# Patient Record
Sex: Female | Born: 1985 | Race: White | Hispanic: No | Marital: Single | Smoking: Current every day smoker
Health system: Southern US, Community
[De-identification: ages and names within clinical notes are randomized; demographics above are authoritative.]

## PROBLEM LIST (undated history)

## (undated) ENCOUNTER — Inpatient Hospital Stay (HOSPITAL_COMMUNITY): Payer: Self-pay

## (undated) DIAGNOSIS — F338 Other recurrent depressive disorders: Secondary | ICD-10-CM

## (undated) DIAGNOSIS — Z789 Other specified health status: Secondary | ICD-10-CM

## (undated) HISTORY — PX: NO PAST SURGERIES: SHX2092

## (undated) HISTORY — PX: WISDOM TOOTH EXTRACTION: SHX21

---
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
## 2006-03-01 ENCOUNTER — Inpatient Hospital Stay (HOSPITAL_COMMUNITY): Admission: AD | Admit: 2006-03-01 | Discharge: 2006-03-01 | Payer: Self-pay | Admitting: Obstetrics and Gynecology

## 2006-03-12 ENCOUNTER — Inpatient Hospital Stay (HOSPITAL_COMMUNITY): Admission: AD | Admit: 2006-03-12 | Discharge: 2006-03-12 | Payer: Self-pay | Admitting: Obstetrics & Gynecology

## 2006-03-15 ENCOUNTER — Inpatient Hospital Stay (HOSPITAL_COMMUNITY): Admission: AD | Admit: 2006-03-15 | Discharge: 2006-03-15 | Payer: Self-pay | Admitting: Obstetrics & Gynecology

## 2006-04-29 ENCOUNTER — Inpatient Hospital Stay (HOSPITAL_COMMUNITY): Admission: AD | Admit: 2006-04-29 | Discharge: 2006-04-30 | Payer: Self-pay | Admitting: Gynecology

## 2006-05-19 ENCOUNTER — Ambulatory Visit (HOSPITAL_COMMUNITY): Admission: RE | Admit: 2006-05-19 | Discharge: 2006-05-19 | Payer: Self-pay | Admitting: Obstetrics & Gynecology

## 2006-09-12 ENCOUNTER — Inpatient Hospital Stay (HOSPITAL_COMMUNITY): Admission: AD | Admit: 2006-09-12 | Discharge: 2006-09-12 | Payer: Self-pay | Admitting: Obstetrics and Gynecology

## 2006-09-12 ENCOUNTER — Ambulatory Visit: Payer: Self-pay | Admitting: Obstetrics and Gynecology

## 2006-09-14 ENCOUNTER — Inpatient Hospital Stay (HOSPITAL_COMMUNITY): Admission: AD | Admit: 2006-09-14 | Discharge: 2006-09-16 | Payer: Self-pay | Admitting: Obstetrics & Gynecology

## 2006-09-14 ENCOUNTER — Ambulatory Visit: Payer: Self-pay | Admitting: Obstetrics and Gynecology

## 2008-04-17 ENCOUNTER — Emergency Department (HOSPITAL_COMMUNITY): Admission: EM | Admit: 2008-04-17 | Discharge: 2008-04-17 | Payer: Self-pay | Admitting: Emergency Medicine

## 2008-09-19 ENCOUNTER — Emergency Department (HOSPITAL_COMMUNITY): Admission: EM | Admit: 2008-09-19 | Discharge: 2008-09-19 | Payer: Self-pay | Admitting: Emergency Medicine

## 2008-09-20 ENCOUNTER — Emergency Department (HOSPITAL_COMMUNITY): Admission: EM | Admit: 2008-09-20 | Discharge: 2008-09-20 | Payer: Self-pay | Admitting: Emergency Medicine

## 2011-07-03 LAB — RAPID STREP SCREEN (MED CTR MEBANE ONLY): Streptococcus, Group A Screen (Direct): NEGATIVE

## 2011-09-10 ENCOUNTER — Ambulatory Visit (INDEPENDENT_AMBULATORY_CARE_PROVIDER_SITE_OTHER): Payer: BC Managed Care – PPO

## 2011-09-10 DIAGNOSIS — J209 Acute bronchitis, unspecified: Secondary | ICD-10-CM

## 2011-09-10 DIAGNOSIS — Z309 Encounter for contraceptive management, unspecified: Secondary | ICD-10-CM

## 2011-09-10 DIAGNOSIS — R05 Cough: Secondary | ICD-10-CM

## 2011-09-10 DIAGNOSIS — R059 Cough, unspecified: Secondary | ICD-10-CM

## 2011-09-29 NOTE — L&D Delivery Note (Signed)
Delivery Note At 9:19 PM a viable and healthy female was delivered via  (Presentation: vertex; LOA ).  APGAR: 8,9 ; weight pending. Placenta status: spontaneous, intact.  Cord:normal, 3 vessel. Cord pH: n/a  Anesthesia:  Epidural Episiotomy:  Lacerations: none Suture Repair: n/a Est. Blood Loss (mL):   Mom to postpartum.  Baby to nursery-stable.  Napoleon Form 06/09/2012, 9:36 PM

## 2011-10-10 ENCOUNTER — Ambulatory Visit (INDEPENDENT_AMBULATORY_CARE_PROVIDER_SITE_OTHER): Payer: BC Managed Care – PPO

## 2011-10-10 DIAGNOSIS — N390 Urinary tract infection, site not specified: Secondary | ICD-10-CM

## 2011-10-10 DIAGNOSIS — N760 Acute vaginitis: Secondary | ICD-10-CM

## 2011-10-10 DIAGNOSIS — R109 Unspecified abdominal pain: Secondary | ICD-10-CM

## 2011-11-15 ENCOUNTER — Encounter (HOSPITAL_COMMUNITY): Payer: Self-pay | Admitting: *Deleted

## 2011-11-15 ENCOUNTER — Inpatient Hospital Stay (HOSPITAL_COMMUNITY)
Admission: AD | Admit: 2011-11-15 | Discharge: 2011-11-15 | Disposition: A | Discharge status: Home / Self Care | Payer: BC Managed Care – PPO | Source: Ambulatory Visit | Attending: Obstetrics & Gynecology | Admitting: Obstetrics & Gynecology

## 2011-11-15 ENCOUNTER — Ambulatory Visit (INDEPENDENT_AMBULATORY_CARE_PROVIDER_SITE_OTHER): Payer: BC Managed Care – PPO | Admitting: Family Medicine

## 2011-11-15 ENCOUNTER — Inpatient Hospital Stay (HOSPITAL_COMMUNITY): Payer: BC Managed Care – PPO

## 2011-11-15 DIAGNOSIS — N949 Unspecified condition associated with female genital organs and menstrual cycle: Secondary | ICD-10-CM

## 2011-11-15 DIAGNOSIS — Z1389 Encounter for screening for other disorder: Secondary | ICD-10-CM

## 2011-11-15 DIAGNOSIS — R1032 Left lower quadrant pain: Secondary | ICD-10-CM

## 2011-11-15 DIAGNOSIS — Z3490 Encounter for supervision of normal pregnancy, unspecified, unspecified trimester: Secondary | ICD-10-CM

## 2011-11-15 DIAGNOSIS — J45909 Unspecified asthma, uncomplicated: Secondary | ICD-10-CM | POA: Insufficient documentation

## 2011-11-15 DIAGNOSIS — Z3201 Encounter for pregnancy test, result positive: Secondary | ICD-10-CM

## 2011-11-15 DIAGNOSIS — R110 Nausea: Secondary | ICD-10-CM

## 2011-11-15 DIAGNOSIS — O3680X0 Pregnancy with inconclusive fetal viability, not applicable or unspecified: Secondary | ICD-10-CM

## 2011-11-15 DIAGNOSIS — R109 Unspecified abdominal pain: Secondary | ICD-10-CM | POA: Insufficient documentation

## 2011-11-15 DIAGNOSIS — O99891 Other specified diseases and conditions complicating pregnancy: Secondary | ICD-10-CM | POA: Insufficient documentation

## 2011-11-15 HISTORY — DX: Other recurrent depressive disorders: F33.8

## 2011-11-15 LAB — POCT CBC
HCT, POC: 41.4 % (ref 37.7–47.9)
Hemoglobin: 14 g/dL (ref 12.2–16.2)
Lymph, poc: 2.5 (ref 0.6–3.4)
MCH, POC: 30.1 pg (ref 27–31.2)
MCHC: 33.8 g/dL (ref 31.8–35.4)
MCV: 89 fL (ref 80–97)
POC MID %: 6.4 %M (ref 0–12)
WBC: 12.9 10*3/uL — AB (ref 4.6–10.2)

## 2011-11-15 LAB — POCT WET PREP WITH KOH
KOH Prep POC: NEGATIVE
Yeast Wet Prep HPF POC: NEGATIVE

## 2011-11-15 NOTE — Progress Notes (Signed)
Subjective:    Patient ID: Laura Guerra, female    DOB: 12/26/1985, 26 y.o.   MRN: [id number]  HPI IUD removal 09/10/2011-no period since then. +pregnancy test 5 days ago. C/O 2 weeks of breast tenderness and nausea. 4 days ago had 1 minute of severe LLQ pain, then it occurred again this morning and lasted about 1 minute.  (Occurred X 1 during visit and brought her to tears.)  No fever.  No vomiting.  G2 P1 (normal pregnancy and vaginal delivery)   Review of Systems  Genitourinary: Positive for flank pain, vaginal discharge and pelvic pain. Negative for dysuria, urgency and vaginal bleeding.  All other systems reviewed and are negative.        Objective:   Physical Exam  Nursing note and vitals reviewed. Constitutional: She is oriented to person, place, and time. She appears well-developed and well-nourished.       Tearful during a sharp pain, otherwise ok  HENT:  Head: Normocephalic and atraumatic.  Neck: Normal range of motion. Neck supple. No thyromegaly present.  Cardiovascular: Normal rate, regular rhythm, normal heart sounds and intact distal pulses.  Exam reveals no gallop and no friction rub.   No murmur heard. Pulmonary/Chest: Effort normal and breath sounds normal. No respiratory distress. She has no wheezes.  Abdominal: Soft. Bowel sounds are normal. She exhibits no distension and no mass. There is no tenderness. There is no rebound and no guarding.  Genitourinary: Vagina normal. Pelvic exam was performed with patient supine. Uterus is enlarged (6-8week uterus). Uterus is not tender. Cervix exhibits discharge. Cervix exhibits no motion tenderness and no friability. Right adnexum displays no fullness (6-8 week fullness). Left adnexum displays tenderness and fullness (mild). No bleeding around the vagina.    Lymphadenopathy:    She has no cervical adenopathy.  Neurological: She is alert and oriented to person, place, and time. She has normal reflexes. No cranial nerve  deficit.  Skin: Skin is warm and dry.  Psychiatric: She has a normal mood and affect.   Results for orders placed in visit on 11/15/11  POCT CBC      Component Value Range   WBC 12.9 (*) 4.6 - 10.2 (K/uL)   Lymph, poc 2.5  0.6 - 3.4    POC LYMPH PERCENT 19.4  10 - 50 (%L)   MID (cbc) 0.8  0 - 0.9    POC MID % 6.4  0 - 12 (%M)   POC Granulocyte 9.6 (*) 2 - 6.9    Granulocyte percent 74.2  37 - 80 (%G)   RBC 4.65  4.04 - 5.48 (M/uL)   Hemoglobin 14.0  12.2 - 16.2 (g/dL)   HCT, POC 11.9  14.7 - 47.9 (%)   MCV 89.0  80 - 97 (fL)   MCH, POC 30.1  27 - 31.2 (pg)   MCHC 33.8  31.8 - 35.4 (g/dL)   RDW, POC 82.9     Platelet Count, POC 244  142 - 424 (K/uL)   MPV 8.7  0 - 99.8 (fL)  POCT WET PREP WITH KOH      Component Value Range   Trichomonas, UA Negative     Clue Cells Wet Prep HPF POC 100%     Epithelial Wet Prep HPF POC Mod     Yeast Wet Prep HPF POC neg     Bacteria Wet Prep HPF POC 4+     RBC Wet Prep HPF POC 0-2     WBC Wet Prep  HPF POC 10-15     KOH Prep POC Negative             Assessment & Plan:  Spoke with Maylon Cos Nurse  Midwife on call, sending directly to Maternity Admissions for R/O ectopic pregnancy.  They will type and cross and order quantitative hcg there. Gen probe sent to [employer] [facility].

## 2011-11-15 NOTE — Progress Notes (Signed)
Pt reports having LLQ abd pain on and off since Thursday. Went to urgent care and was sent to MAU for further eval.

## 2011-11-15 NOTE — Discharge Instructions (Signed)
    ________________________________________ ° ° ° ° °To schedule your Maternity Eligibility Appointment, please call [phone].  When you arrive for your appointment you must bring the following items or information listed below.  Your appointment will be rescheduled if you do not have these items or are 15 minutes late. °If currently receiving Medicaid, you MUST bring: °1. Medicaid Card °2. Social Security Card °3. Picture ID °4. Proof of Pregnancy °5. Verification of current address if the address on Medicaid card is incorrect "postmarked mail" °If not receiving Medicaid, you MUST bring: °1. Social Security Card °2. Picture ID °3. Birth Certificate (if available) Passport or *Green Card °4. Proof of Pregnancy °5. Verification of current address "postmarked mail" for each income presented. °6. Verification of insurance coverage, if any °7. Check stubs from each employer for the previous month (if unable to present check stub  °for each week, we will accept check stub for the first and last week ill the same month.) If you can't locate check stubs, you must bring a letter from the employer(s) and it must have the following information on letterhead, typed, in English: °o name of company °o company telephone number °o how long been with the company, if less than one month °o how much person earns per hour °o how many hours per week work °o the gross pay the person earned for the previous month °If you are 26 years old or less, you do not have to bring proof of income unless you work or live with the father of the baby and at that time we will need proof of income from you and/or the father of the baby. °Green Card recipients are eligible for Medicaid for Pregnant Women (MPW) ° °Prenatal Care Providers °[facility]    [facility] ° Phone- [phone]     Phone: [phone] °         °[facility]                      [facility] ° @[place]     Phone: [phone] ° Phone: [phone] °        [facility] °[facility]     Phone: [phone] ° Phone: [phone]   °        [facility] °[facility] [place]                hone: [phone] ° Phone: [phone] °        [facility] °Dr. Bernard Marshall      Phone: [phone] ° Phone: [phone] °        [facility] °[facility]                Phone: [phone] ° [facility]  ° Phone:[phone]    [facility]) °         Phone: [phone] °[facility] °  Phone: [phone] °

## 2011-12-07 ENCOUNTER — Inpatient Hospital Stay (HOSPITAL_COMMUNITY)
Admission: AD | Admit: 2011-12-07 | Discharge: 2011-12-07 | Disposition: A | Discharge status: Home / Self Care | Payer: BC Managed Care – PPO | Source: Ambulatory Visit | Attending: Obstetrics and Gynecology | Admitting: Obstetrics and Gynecology

## 2011-12-07 ENCOUNTER — Encounter (HOSPITAL_COMMUNITY): Payer: Self-pay | Admitting: *Deleted

## 2011-12-07 ENCOUNTER — Inpatient Hospital Stay (HOSPITAL_COMMUNITY): Payer: BC Managed Care – PPO

## 2011-12-07 DIAGNOSIS — O200 Threatened abortion: Secondary | ICD-10-CM | POA: Insufficient documentation

## 2011-12-07 LAB — URINALYSIS, ROUTINE W REFLEX MICROSCOPIC
Glucose, UA: NEGATIVE mg/dL
Leukocytes, UA: NEGATIVE
Specific Gravity, Urine: 1.025 (ref 1.005–1.030)

## 2011-12-07 LAB — URINE MICROSCOPIC-ADD ON

## 2011-12-07 NOTE — MAU Note (Signed)
Pt states she went to lthe bathroom and noticed a bloody discharge when she urinated around 1930-not wearing a pad now

## 2011-12-07 NOTE — ED Provider Notes (Signed)
History    26 yo G2P1001 WF @ 12 2/[redacted] wk gestation presents w/ c/o vaginal bleeding. (+) coitus 2 days ago. Some cramping  Chief Complaint  Patient presents with  . Vaginal Bleeding     OB History    Grav Para Term Preterm Abortions TAB SAB Ect Mult Living   2 1 1       1       Past Medical History  Diagnosis Date  . Seasonal depression     Past Surgical History  Procedure Date  . No past surgeries     Family History  Problem Relation Age of Onset  . Cancer Father   . Cancer Maternal Grandmother     History  Substance Use Topics  . Smoking status: Former Smoker    Types: Cigarettes    Quit date: 11/10/2011  . Smokeless tobacco: Not on file  . Alcohol Use: Not on file    Allergies: No Known Allergies  Prescriptions prior to admission  Medication Sig Dispense Refill  . ibuprofen (ADVIL,MOTRIN) 200 MG tablet Take 400 mg by mouth every 6 (six) hours as needed. For pain.         Physical Exam   Blood pressure 107/63, pulse 83, temperature 98.2 F (36.8 C), temperature source Oral, resp. rate 20, height 5\' 4"  (1.626 m), weight 155 lb (70.308 kg), last menstrual period 09/12/2011, SpO2 99.00%.  General appearance: alert, cooperative and no distress Lungs: clear to auscultation bilaterally Heart: regular rate and rhythm, S1, S2 normal, no murmur, click, rub or gallop Abdomen: soft, non-tender; bowel sounds normal; no masses,  no organomegaly Pelvic: cervix normal in appearance, external genitalia normal, no adnexal masses or tenderness and dark red blood in vagina cervix parous closed uterus 9-10 wk size Extremities: no edema, redness or tenderness in the calves or thighs ED Course  Threatened AB P) check maternal blood type( =O pos), pelvic sono . Threatened ab prec MDM  Serita Kyle, MD 9:58 PM 12/07/2011

## 2011-12-31 ENCOUNTER — Ambulatory Visit (INDEPENDENT_AMBULATORY_CARE_PROVIDER_SITE_OTHER): Payer: BC Managed Care – PPO | Admitting: Family Medicine

## 2011-12-31 VITALS — BP 98/60 | HR 85 | Temp 98.3°F | Resp 16 | Ht 64.25 in | Wt 157.8 lb

## 2011-12-31 DIAGNOSIS — R51 Headache: Secondary | ICD-10-CM

## 2011-12-31 MED ORDER — BUTALBITAL-APAP-CAFFEINE 50-325-40 MG PO TABS: 1.0000 | ORAL_TABLET | Freq: Four times a day (QID) | ORAL | Status: DC | PRN

## 2011-12-31 NOTE — Progress Notes (Signed)
  Subjective:    Patient ID: Laura Guerra, female    DOB: Sep 30, 1985, 26 y.o.   MRN: [id number]  HPI 26 yo female, 15.[redacted] weeks pregnant, with headache.  [facility].  Next appt Monday.   Monday at work got severe headache.  Frontal.  Pressure.  Comes and goes.  Getting daily. Occasionally pounding.  Headaches worse since pregnancy.  Tylenol not helping.  No runny nose, no cough.   No headache today.   Review of Systems Negative except as per HPI     Objective:   Physical Exam  Constitutional: Vital signs are normal. She appears well-developed and well-nourished. She is active and cooperative.  HENT:  Head: Normocephalic.  Right Ear: Hearing, tympanic membrane, external ear and ear canal normal.  Left Ear: Hearing, tympanic membrane, external ear and ear canal normal.  Nose: Nose normal.  Mouth/Throat: Uvula is midline, oropharynx is clear and moist and mucous membranes are normal.  Eyes: Conjunctivae, EOM and lids are normal. Pupils are equal, round, and reactive to light. Right eye exhibits no nystagmus. Left eye exhibits no nystagmus.  Cardiovascular: Normal rate, regular rhythm, normal heart sounds, intact distal pulses and normal pulses.   Pulmonary/Chest: Effort normal and breath sounds normal.  Neurological: She is alert. She has normal strength. No cranial nerve deficit. She displays a negative Romberg sign. Coordination and gait normal.          Assessment & Plan:  Headaches, worsened in pregnancy.  Can try fioricet for severest headaches, otherwise use tylenol.  Advised to bring up with OB at appt Monday as well.

## 2012-01-23 ENCOUNTER — Inpatient Hospital Stay (HOSPITAL_COMMUNITY)
Admission: AD | Admit: 2012-01-23 | Discharge: 2012-01-23 | Disposition: A | Discharge status: Home / Self Care | Payer: BC Managed Care – PPO | Source: Ambulatory Visit | Attending: Obstetrics & Gynecology | Admitting: Obstetrics & Gynecology

## 2012-01-23 ENCOUNTER — Encounter (HOSPITAL_COMMUNITY): Payer: Self-pay

## 2012-01-23 ENCOUNTER — Inpatient Hospital Stay (HOSPITAL_COMMUNITY): Payer: BC Managed Care – PPO

## 2012-01-23 DIAGNOSIS — N949 Unspecified condition associated with female genital organs and menstrual cycle: Secondary | ICD-10-CM

## 2012-01-23 DIAGNOSIS — O9A219 Injury, poisoning and certain other consequences of external causes complicating pregnancy, unspecified trimester: Secondary | ICD-10-CM

## 2012-01-23 DIAGNOSIS — Z87440 Personal history of urinary (tract) infections: Secondary | ICD-10-CM

## 2012-01-23 DIAGNOSIS — R1032 Left lower quadrant pain: Secondary | ICD-10-CM | POA: Insufficient documentation

## 2012-01-23 DIAGNOSIS — Z3480 Encounter for supervision of other normal pregnancy, unspecified trimester: Secondary | ICD-10-CM

## 2012-01-23 DIAGNOSIS — O99891 Other specified diseases and conditions complicating pregnancy: Secondary | ICD-10-CM | POA: Insufficient documentation

## 2012-01-23 HISTORY — DX: Other specified health status: Z78.9

## 2012-01-23 LAB — URINALYSIS, ROUTINE W REFLEX MICROSCOPIC
Ketones, ur: NEGATIVE mg/dL
Leukocytes, UA: NEGATIVE
Protein, ur: NEGATIVE mg/dL
Urobilinogen, UA: 0.2 mg/dL (ref 0.0–1.0)

## 2012-01-23 LAB — URINE MICROSCOPIC-ADD ON

## 2012-01-23 LAB — WET PREP, GENITAL
Trich, Wet Prep: NONE SEEN
Yeast Wet Prep HPF POC: NONE SEEN

## 2012-01-23 MED ORDER — IBUPROFEN 600 MG PO TABS: 600.0000 mg | ORAL_TABLET | Freq: Four times a day (QID) | ORAL | Status: AC | PRN

## 2012-01-23 MED ORDER — IBUPROFEN 600 MG PO TABS
600.0000 mg | ORAL_TABLET | Freq: Once | ORAL | Status: AC
  Administered 2012-01-23: 600 mg via ORAL
  Filled 2012-01-23: qty 1

## 2012-01-23 NOTE — Discharge Instructions (Signed)
Prenatal Care Providers °[facility]    [facility] ° Phone- [phone]     Phone: [phone] °         °[facility]                      [facility] ° @[place]     Phone: [phone] ° Phone: [phone] °        [facility] °[facility]     Phone: [phone] ° Phone: [phone]   °        [facility] °[facility] [place]                hone: [phone] ° Phone: [phone] °        [facility] °Dr. Bernard Marshall      Phone: [phone] ° Phone: [phone] °        [facility] °[facility]                Phone: [phone] ° [facility]  ° Phone:[phone]    [facility] ([place]) °         Phone: [phone] °[facility] °  Phone: [phone] °
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
## 2012-01-24 LAB — URINE CULTURE
Colony Count: NO GROWTH
Culture: NO GROWTH

## 2012-01-25 LAB — GC/CHLAMYDIA PROBE AMP, GENITAL: Chlamydia, DNA Probe: NEGATIVE

## 2012-01-28 ENCOUNTER — Telehealth: Payer: Self-pay | Admitting: *Deleted

## 2012-01-28 DIAGNOSIS — O4590 Premature separation of placenta, unspecified, unspecified trimester: Secondary | ICD-10-CM | POA: Insufficient documentation

## 2012-01-28 NOTE — Telephone Encounter (Signed)
Message copied by Jill Side on Thu Jan 28, 2012 11:27 AM ------      Message from: Malena Catholic      Created: Thu Jan 28, 2012 10:54 AM       I made her [facility] appt for 5/9 at 8:00 am.            Thanks,      Herbert Seta      ----- Message -----         From: Drucilla Schmidt Linc Renne, RN         Sent: 01/28/2012  10:02 AM           To: [facility] Admin Pool            Please schedule HR clinic appt, then I will schedule [place] and call pt.  Thanks       ----- Message -----         From: Lesly Dukes, MD         Sent: 01/25/2012   6:39 PM           To: [facility] Clinical Pool            Pt has large placenta abruption from abdominal trauma.  Pt needs to be seen in HR clinic, and f/u scan in 2 weeks from last scan at [facility] for large abruption.

## 2012-01-28 NOTE — Telephone Encounter (Signed)
Called and spoke w/pt re: plan of care and appts which have been made.  Pt stated that she had made appt @ [facility] for prenatal care next week and did not know that we had an [facility] office here [facility].  She further stated that she liked [facility] very much and would prefer to be seen by a practice that she is a part of.  I informed pt that she needs to be aware that she would see both midwives and physicians @ our clinic and it would not be the same person all the time. Pt was agreeable and will keep appts as scheduled @ our clinic and [place] @ [facility] on 02/08/12.  She will cancel the appt @ [facility].  When I mentioned the concern for pt having placental abruption, she seemed not to be aware of the diagnosis. I explained the situation to her satisfaction. She had questions about working because she lifts heavy items and is still having abdominal pain. I stated that I will check with the doctor as to whether or not it is allright for her to continue working.  I will call her back when I have an answer. Pt voiced understanding.

## 2012-01-28 NOTE — Telephone Encounter (Signed)
Called pt @ 1640 and informed her that the doctor said it is ok for her to work @ this point in time.  Pt became upset and expressed concerns because of the physical nature of her job requirements and the fact that she has a HR pregnancy. I advised pt to discuss her concerns completely @ her visit on 02/04/12. I stated that it may help for the midwife to hear directly from her what her job entails.  Pt asked again for information about placental abruption.  I explained the condition and stated that many people are able to work under these circumstances because the severity is not the same for everyone.  Pt stated that she is expected to lift up to 70 lbs by herself @ work. I stated that we can give her a note which states that she can only lift up to 20 lbs.  Pt voiced understanding.

## 2012-02-04 ENCOUNTER — Encounter: Payer: Self-pay | Admitting: Obstetrics & Gynecology

## 2012-02-04 ENCOUNTER — Encounter: Payer: Self-pay | Admitting: Obstetrics and Gynecology

## 2012-02-04 ENCOUNTER — Ambulatory Visit (INDEPENDENT_AMBULATORY_CARE_PROVIDER_SITE_OTHER): Payer: BC Managed Care – PPO | Admitting: Obstetrics and Gynecology

## 2012-02-04 VITALS — BP 114/63 | Temp 97.1°F | Wt 161.7 lb

## 2012-02-04 DIAGNOSIS — O99330 Smoking (tobacco) complicating pregnancy, unspecified trimester: Secondary | ICD-10-CM

## 2012-02-04 DIAGNOSIS — O4590 Premature separation of placenta, unspecified, unspecified trimester: Secondary | ICD-10-CM

## 2012-02-04 NOTE — Progress Notes (Signed)
Nutrition Note: (1st visit consult) Pt seen at clinic for bleeding in 2nd trimester, pt was also a smoker but quit. Pt reports hx of high wt gain of 72# with daughter.  Pt has good intake with a wide variety of foods.  No N/V and no food allergies.  Pt has hx of daily physical activity, bike riding, and wt lifting. Cannot currently lift >10# or be on feet for >20 minutes due to bleeding.  Disc alternatives for activity.   Disc wt gain goals of 25-35#.  Referred to [facility] for appt if needed.  Follow up if referred.  Cy Blamer, RD

## 2012-02-04 NOTE — MAU Provider Note (Signed)
Medical Screening exam and patient care preformed by advanced practice provider.  Agree with the above management.  

## 2012-02-04 NOTE — Patient Instructions (Signed)
Pregnancy and Smoking Smoking during pregnancy is very unhealthy for the mother and the developing fetus. The addictive drug in cigarettes (nicotine), carbon monoxide, and many other poisons are inhaled from a cigarette and are carried through your bloodstream to your fetus. Cigarette smoke contains more than 2,500 chemicals. It is not known which of these chemicals are harmful to the developing fetus. However, both nicotine and carbon monoxide play a role in causing health problems in pregnancy. Effects on the fetus of smoking during pregnancy:  Decrease in blood flow and oxygen to the uterus, placenta, and your fetus.   Increased heart rate of the fetus.   Slowing of your fetus's growth in the uterus (intrauterine growth retardation).   Placental problems. Placenta may partially cover or completely cover the opening to the cervix (placenta previa), or the placenta may partially or completely separate from the uterus (placental abruption).   Increase risk of pregnancy outside of the uterus (tubal pregnancy).   Premature rupture membranes, causing the sac that holds the fetus to break too early, resulting in premature birth and increased health risks to the newborn.   Increased risk of birth defects, including heart defects.   Increased risk of miscarriage.  Newborns born to women who smoke during pregnancy:  Are more likely to be born too early (prematurely).   Are more likely to be at a low birth weight.   Are at risk for serious health problems, chronic or lifelong disabilities (cerebral palsy, mental retardation, learning problems), and possibly even death   Are at risk of Sudden Infant Death Syndrome (SIDS).   Have higher rates of miscarriage and stillbirth.   Have more lung and breathing (respiratory) problems.  Long-term effects on a child's behavior: Some of the following trends are seen with children of smoking mothers:  Increased risk for drug abuse, behavior, and  conduct disorders.   Increased risk for smoking in adolescent girls.   Increased risk for negative behavior in 2-year-olds.   Increase risk for asthma, colic, and childhood obesity, which can lead to diabetes.   Increased risk for finger and toe disorders.  Resources to stop smoking during pregnancy:  Counseling.   Psychological treatment.   Acupuncture.   Family intervention.   Hypnosis.   Medicines that are safe to take during pregnancy. Nicotine supplements have not been studied enough. They should only be considered when all other methods fail.   Telephone QUIT lines.  Smoking and Breastfeeding:  Nicotine gets passed to the infant through a mother's breastmilk. This can cause nausea, colic, cramping, and diarrhea in the infant.   Smoking may reduce milk supply and interfere with the let-down response.   Even formula-fed infants of mothers who smoke have nicotine and cotinine (nicotine by-product) in their urine.  Other resources to help stop smoking:  American Cancer Society: www.cancer.org   American Heart Association: www.americanheart.org   National Cancer Institute: www.cancer.gov   Smoke Free Families: www.smokefreefamilies.org   [employer] (QUIT Line): 1-866-66 START  Document Released: 01/26/2005 Document Revised: 09/03/2011 Document Reviewed: 06/26/2009 [employer] Patient Information 2012 [employer]. Breastfeeding BENEFITS OF BREASTFEEDING For the baby  The first milk (colostrum) helps the baby's digestive system function better.   There are antibodies from the mother in the milk that help the baby fight off infections.   The baby has a lower incidence of asthma, allergies, and SIDS (sudden infant death syndrome).   The nutrients in breast milk are better than formulas for the baby and helps the baby's   brain grow better.   Babies who breastfeed have less gas, colic, and constipation.  For the mother  Breastfeeding helps develop a very special  bond between mother and baby.   It is more convenient, always available at the correct temperature and cheaper than formula feeding.   It burns calories in the mother and helps with losing weight that was gained during pregnancy.   It makes the uterus contract back down to normal size faster and slows bleeding following delivery.   Breastfeeding mothers have a lower risk of developing breast cancer.  NURSE FREQUENTLY  A healthy, full-term baby may breastfeed as often as every hour or space his or her feedings to every 3 hours.   How often to nurse will vary from baby to baby. Watch your baby for signs of hunger, not the clock.   Nurse as often as the baby requests, or when you feel the need to reduce the fullness of your breasts.   Awaken the baby if it has been 3 to 4 hours since the last feeding.   Frequent feeding will help the mother make more milk and will prevent problems like sore nipples and engorgement of the breasts.  BABY'S POSITION AT THE BREAST  Whether lying down or sitting, be sure that the baby's tummy is facing your tummy.   Support the breast with 4 fingers underneath the breast and the thumb above. Make sure your fingers are well away from the nipple and baby's mouth.   Stroke the baby's lips and cheek closest to the breast gently with your finger or nipple.   When the baby's mouth is open wide enough, place all of your nipple and as much of the dark area around the nipple as possible into your baby's mouth.   Pull the baby in close so the tip of the nose and the baby's cheeks touch the breast during the feeding.  FEEDINGS  The length of each feeding varies from baby to baby and from feeding to feeding.   The baby must suck about 2 to 3 minutes for your milk to get to him or her. This is called a "let down." For this reason, allow the baby to feed on each breast as long as he or she wants. Your baby will end the feeding when he or she has received the right  balance of nutrients.   To break the suction, put your finger into the corner of the baby's mouth and slide it between his or her gums before removing your breast from his or her mouth. This will help prevent sore nipples.  REDUCING BREAST ENGORGEMENT  In the first week after your baby is born, you may experience signs of breast engorgement. When breasts are engorged, they feel heavy, warm, full, and may be tender to the touch. You can reduce engorgement if you:   Nurse frequently, every 2 to 3 hours. Mothers who breastfeed early and often have fewer problems with engorgement.   Place light ice packs on your breasts between feedings. This reduces swelling. Wrap the ice packs in a lightweight towel to protect your skin.   Apply moist hot packs to your breast for 5 to 10 minutes before each feeding. This increases circulation and helps the milk flow.   Gently massage your breast before and during the feeding.   Make sure that the baby empties at least one breast at every feeding before switching sides.   Use a breast pump to empty the breasts if   your baby is sleepy or not nursing well. You may also want to pump if you are returning to work or or you feel you are getting engorged.   Avoid bottle feeds, pacifiers or supplemental feedings of water or juice in place of breastfeeding.   Be sure the baby is latched on and positioned properly while breastfeeding.   Prevent fatigue, stress, and anemia.   Wear a supportive bra, avoiding underwire styles.   Eat a balanced diet with enough fluids.  If you follow these suggestions, your engorgement should improve in 24 to 48 hours. If you are still experiencing difficulty, call your lactation consultant or caregiver. IS MY BABY GETTING ENOUGH MILK? Sometimes, mothers worry about whether their babies are getting enough milk. You can be assured that your baby is getting enough milk if:  The baby is actively sucking and you hear swallowing.   The  baby nurses at least 8 to 12 times in a 24 hour time period. Nurse your baby until he or she unlatches or falls asleep at the first breast (at least 10 to 20 minutes), then offer the second side.   The baby is wetting 5 to 6 disposable diapers (6 to 8 cloth diapers) in a 24 hour period by 5 to 6 days of age.   The baby is having at least 2 to 3 stools every 24 hours for the first few months. Breast milk is all the food your baby needs. It is not necessary for your baby to have water or formula. In fact, to help your breasts make more milk, it is best not to give your baby supplemental feedings during the early weeks.   The stool should be soft and yellow.   The baby should gain 4 to 7 ounces per week after he is 4 days old.  TAKE CARE OF YOURSELF Take care of your breasts by:  Bathing or showering daily.   Avoiding the use of soaps on your nipples.   Start feedings on your left breast at one feeding and on your right breast at the next feeding.   You will notice an increase in your milk supply 2 to 5 days after delivery. You may feel some discomfort from engorgement, which makes your breasts very firm and often tender. Engorgement "peaks" out within 24 to 48 hours. In the meantime, apply warm moist towels to your breasts for 5 to 10 minutes before feeding. Gentle massage and expression of some milk before feeding will soften your breasts, making it easier for your baby to latch on. Wear a well fitting nursing bra and air dry your nipples for 10 to 15 minutes after each feeding.   Only use cotton bra pads.   Only use pure lanolin on your nipples after nursing. You do not need to wash it off before nursing.  Take care of yourself by:   Eating well-balanced meals and nutritious snacks.   Drinking milk, fruit juice, and water to satisfy your thirst (about 8 glasses a day).   Getting plenty of rest.   Increasing calcium in your diet (1200 mg a day).   Avoiding foods that you notice affect  the baby in a bad way.  SEEK MEDICAL CARE IF:   You have any questions or difficulty with breastfeeding.   You need help.   You have a hard, red, sore area on your breast, accompanied by a fever of 100.5 F (38.1 C) or more.   Your baby is too sleepy to   eat well or is having trouble sleeping.   Your baby is wetting less than 6 diapers per day, by 5 days of age.   Your baby's skin or white part of his or her eyes is more yellow than it was in the hospital.   You feel depressed.  Document Released: 09/14/2005 Document Revised: 09/03/2011 Document Reviewed: 04/29/2009 [employer] Patient Information 2012 [employer]. 

## 2012-02-04 NOTE — Progress Notes (Signed)
   Subjective:    Laura Guerra is a G2P1001 [redacted]w[redacted]d being seen today for her first obstetrical visit.  Her obstetrical history is significant for smoker and 2nd tri vag bleed. Had rug roll hit her in abd at work ([employer]) 2 wk ago. Quit smoking 1 wk ago.. Patient does intend to breast feed.Female fetus.  Pregnancy history fully reviewed.  Patient reports constatn LLQ pain (has been attributed to RLP). Has had neg WP, GC,CT,UA.Ceasar Mons Vitals:   02/04/12 0833  BP: 114/63  Temp: 97.1 F (36.2 C)  Weight: 73.347 kg (161 lb 11.2 oz)    HISTORY: OB History    Grav Para Term Preterm Abortions TAB SAB Ect Mult Living   2 1 1  0 0 0 0 0 0 1     # Outc Date GA Lbr Len/2nd Wgt Sex Del Anes PTL Lv   1 TRM 12/07 [redacted]w[redacted]d 02:00 6lb10oz(3.005kg) F SVD EPI  Yes   2 CUR              Past Medical History  Diagnosis Date  . Seasonal depression   . No pertinent past medical history    Past Surgical History  Procedure Date  . No past surgeries    Family History  Problem Relation Age of Onset  . Cancer Father   . Cancer Maternal Grandmother      Exam    Uterus:     Pelvic Exam:    Perineum: No Hemorrhoids   Vulva: normal   Vagina:  normal mucosa, normal discharge       Cervix: no lesions and multiparous   Adnexa: not evaluated   Bony Pelvis: proven to 6#10  System: Breast:  Inspection negative   Skin: normal coloration and turgor, no rashes    Neurologic: oriented, normal, grossly non-focal   Extremities: normal strength, tone, and muscle mass, no evidence of joint effusion   HEENT PERRLA   Mouth/Teeth mucous membranes moist, pharynx normal without lesions and dental hygiene good   Neck supple and no masses   Cardiovascular: regular rate and rhythm   Respiratory:  appears well, vitals normal, no respiratory distress, acyanotic, normal RR, ear and throat exam is normal, neck free of mass or lymphadenopathy, chest clear, no wheezing, crepitations, rhonchi, normal symmetric air  entry   Abdomen: normal findings: fundus at lower umbilicus   Urinary: urethral meatus normal      Assessment:    Pregnancy: G2P1001 Patient Active Problem List  Diagnoses  . Asthma  . Premature separation of placenta, antepartum       Smoker Plan:     Initial labs drawn. Prenatal vitamins. Problem list reviewed and updated. Genetic Screening discussed Quad Screen: declined.  Ultrasound discussed; fetal survey: ordered.  Follow up in 3 weeks. Advised pelvic rest until next visit Letter to work no more that 10# weight 50% of 40 min visit spent on counseling and coordination of care.  Smoking cessatino   Meara Wiechman 02/04/2012

## 2012-02-04 NOTE — Progress Notes (Signed)
Education education booklet given along with smoking cessation materials.  Discussed with pt weight gain this pregnancy 25-35lbs  Flu vaccine declined.  Informed pt about tdap and pt stated she wants the injection at time. Edema- feet, Pain- left side and lower back.  Bloody show on Sunday "just a little bit" none since then.

## 2012-02-05 LAB — OBSTETRIC PANEL
Antibody Screen: NEGATIVE
Basophils Relative: 0 % (ref 0–1)
HCT: 36 % (ref 36.0–46.0)
Hemoglobin: 12.5 g/dL (ref 12.0–15.0)
Hepatitis B Surface Ag: NEGATIVE
Lymphocytes Relative: 21 % (ref 12–46)
Lymphs Abs: 2.5 10*3/uL (ref 0.7–4.0)
MCHC: 34.7 g/dL (ref 30.0–36.0)
Monocytes Absolute: 0.8 10*3/uL (ref 0.1–1.0)
Monocytes Relative: 7 % (ref 3–12)
Neutro Abs: 8.2 10*3/uL — ABNORMAL HIGH (ref 1.7–7.7)
RBC: 4.05 MIL/uL (ref 3.87–5.11)
Rh Type: POSITIVE
Rubella: 29.2 IU/mL — ABNORMAL HIGH

## 2012-02-07 LAB — CULTURE, OB URINE

## 2012-02-08 ENCOUNTER — Other Ambulatory Visit: Payer: Self-pay | Admitting: Obstetrics & Gynecology

## 2012-02-08 ENCOUNTER — Encounter: Payer: Self-pay | Admitting: Obstetrics & Gynecology

## 2012-02-08 ENCOUNTER — Telehealth: Payer: Self-pay | Admitting: *Deleted

## 2012-02-08 ENCOUNTER — Ambulatory Visit (HOSPITAL_COMMUNITY): Payer: BC Managed Care – PPO

## 2012-02-08 ENCOUNTER — Ambulatory Visit (HOSPITAL_COMMUNITY)
Admission: RE | Admit: 2012-02-08 | Discharge: 2012-02-08 | Disposition: A | Discharge status: Home / Self Care | Payer: BC Managed Care – PPO | Source: Ambulatory Visit | Attending: Obstetrics and Gynecology | Admitting: Obstetrics and Gynecology

## 2012-02-08 DIAGNOSIS — O358XX0 Maternal care for other (suspected) fetal abnormality and damage, not applicable or unspecified: Secondary | ICD-10-CM | POA: Insufficient documentation

## 2012-02-08 DIAGNOSIS — O4590 Premature separation of placenta, unspecified, unspecified trimester: Secondary | ICD-10-CM

## 2012-02-08 DIAGNOSIS — N390 Urinary tract infection, site not specified: Secondary | ICD-10-CM

## 2012-02-08 DIAGNOSIS — Z363 Encounter for antenatal screening for malformations: Secondary | ICD-10-CM | POA: Insufficient documentation

## 2012-02-08 DIAGNOSIS — Z1389 Encounter for screening for other disorder: Secondary | ICD-10-CM | POA: Insufficient documentation

## 2012-02-08 MED ORDER — AMOXICILLIN 500 MG PO CAPS: 500.0000 mg | ORAL_CAPSULE | Freq: Three times a day (TID) | ORAL | Status: AC

## 2012-02-08 NOTE — Telephone Encounter (Signed)
Message copied by Mannie Stabile on Mon Feb 08, 2012 11:34 AM ------      Message from: Danae Orleans      Created: Sun Feb 07, 2012  9:23 AM       Enterococcus ASB. Please prescribe Amox 500 tid x 10 days NR      ----- Message -----         From: [facility]         Sent: 02/04/2012   7:32 PM           To: Danae Orleans, CNM

## 2012-02-08 NOTE — Telephone Encounter (Signed)
No answer left message to call us back. Rx sent to pharmacy.

## 2012-02-09 ENCOUNTER — Encounter: Payer: Self-pay | Admitting: Advanced Practice Midwife

## 2012-02-09 NOTE — Telephone Encounter (Signed)
Pt returned call yesterday and stated that she has been contacted about a prescription from our office. She was informed that the medication prescribed is for a bladder infection. Pt voiced understanding.

## 2012-02-25 ENCOUNTER — Ambulatory Visit (INDEPENDENT_AMBULATORY_CARE_PROVIDER_SITE_OTHER): Payer: BC Managed Care – PPO | Admitting: Family Medicine

## 2012-02-25 ENCOUNTER — Encounter: Payer: Self-pay | Admitting: Family Medicine

## 2012-02-25 VITALS — BP 105/60 | Temp 97.0°F | Wt 166.7 lb

## 2012-02-25 DIAGNOSIS — B373 Candidiasis of vulva and vagina: Secondary | ICD-10-CM

## 2012-02-25 DIAGNOSIS — B3731 Acute candidiasis of vulva and vagina: Secondary | ICD-10-CM

## 2012-02-25 DIAGNOSIS — O4590 Premature separation of placenta, unspecified, unspecified trimester: Secondary | ICD-10-CM

## 2012-02-25 DIAGNOSIS — O0990 Supervision of high risk pregnancy, unspecified, unspecified trimester: Secondary | ICD-10-CM | POA: Insufficient documentation

## 2012-02-25 LAB — POCT URINALYSIS DIP (DEVICE)
Nitrite: NEGATIVE
Protein, ur: NEGATIVE mg/dL
Specific Gravity, Urine: 1.025 (ref 1.005–1.030)
Urobilinogen, UA: 0.2 mg/dL (ref 0.0–1.0)
pH: 6 (ref 5.0–8.0)

## 2012-02-25 MED ORDER — FLUCONAZOLE 150 MG PO TABS: 150.0000 mg | ORAL_TABLET | Freq: Once | ORAL | Status: AC

## 2012-02-25 NOTE — Progress Notes (Signed)
Addended by: Levie Heritage on: 02/25/2012 08:37 AM   Modules accepted: Orders

## 2012-02-25 NOTE — Progress Notes (Signed)
Clumpy white discharge, vaginal itching after completing antibiotics.  Prescribed diflucan.   No contractions, vaginal bleeding, decreased fetal activity.

## 2012-02-25 NOTE — Progress Notes (Signed)
Pulse- 74  Edema-feet  Pain-left side Pt c/o white, itchy discharge

## 2012-02-25 NOTE — Patient Instructions (Signed)
Monilial Vaginitis Vaginitis in a soreness, swelling and redness (inflammation) of the vagina and vulva. Monilial vaginitis is not a sexually transmitted infection. CAUSES  Yeast vaginitis is caused by yeast (candida) that is normally found in your vagina. With a yeast infection, the candida has overgrown in number to a point that upsets the chemical balance. SYMPTOMS   White, thick vaginal discharge.   Swelling, itching, redness and irritation of the vagina and possibly the lips of the vagina (vulva).   Burning or painful urination.   Painful intercourse.  DIAGNOSIS  Things that may contribute to monilial vaginitis are:  Postmenopausal and virginal states.   Pregnancy.   Infections.   Being tired, sick or stressed, especially if you had monilial vaginitis in the past.   Diabetes. Good control will help lower the chance.   Birth control pills.   Tight fitting garments.   Using bubble bath, feminine sprays, douches or deodorant tampons.   Taking certain medications that kill germs (antibiotics).   Sporadic recurrence can occur if you become ill.  TREATMENT  Your caregiver will give you medication.  There are several kinds of anti monilial vaginal creams and suppositories specific for monilial vaginitis. For recurrent yeast infections, use a suppository or cream in the vagina 2 times a week, or as directed.   Anti-monilial or steroid cream for the itching or irritation of the vulva may also be used. Get your caregiver's permission.   Painting the vagina with methylene blue solution may help if the monilial cream does not work.   Eating yogurt may help prevent monilial vaginitis.  HOME CARE INSTRUCTIONS   Finish all medication as prescribed.   Do not have sex until treatment is completed or after your caregiver tells you it is okay.   Take warm sitz baths.   Do not douche.   Do not use tampons, especially scented ones.   Wear cotton underwear.   Avoid tight  pants and panty hose.   Tell your sexual partner that you have a yeast infection. They should go to their caregiver if they have symptoms such as mild rash or itching.   Your sexual partner should be treated as well if your infection is difficult to eliminate.   Practice safer sex. Use condoms.   Some vaginal medications cause latex condoms to fail. Vaginal medications that harm condoms are:   Cleocin cream.   Butoconazole (Femstat).   Terconazole (Terazol) vaginal suppository.   Miconazole (Monistat) (may be purchased over the counter).  SEEK MEDICAL CARE IF:   You have a temperature by mouth above 102 F (38.9 C).   The infection is getting worse after 2 days of treatment.   The infection is not getting better after 3 days of treatment.   You develop blisters in or around your vagina.   You develop vaginal bleeding, and it is not your menstrual period.   You have pain when you urinate.   You develop intestinal problems.   You have pain with sexual intercourse.  Document Released: 06/24/2005 Document Revised: 09/03/2011 Document Reviewed: 03/08/2009 [employer] Patient Information 2012 [employer]. 

## 2012-02-29 ENCOUNTER — Other Ambulatory Visit: Payer: Self-pay | Admitting: Family Medicine

## 2012-02-29 ENCOUNTER — Telehealth: Payer: Self-pay | Admitting: *Deleted

## 2012-02-29 MED ORDER — CEPHALEXIN 500 MG PO CAPS: 500.0000 mg | ORAL_CAPSULE | Freq: Four times a day (QID) | ORAL | Status: AC

## 2012-02-29 NOTE — Telephone Encounter (Signed)
Message copied by Mannie Stabile on Mon Feb 29, 2012 11:28 AM ------      Message from: Levie Heritage      Created: Mon Feb 29, 2012 12:29 AM       + UTI - keflex 500mg  qid x 7 days sent to pharmacy.  Please inform patient

## 2012-02-29 NOTE — Telephone Encounter (Signed)
Called patient to inform her of results. No answer, left her a voicemail to call us back for results.

## 2012-02-29 NOTE — Telephone Encounter (Signed)
Patient returned call to clinic and advised of UTI and to pick RX up at pharmacy. Patient voiced understanding.

## 2012-03-01 LAB — HM PAP SMEAR: HM PAP: NORMAL

## 2012-03-08 ENCOUNTER — Other Ambulatory Visit: Payer: Self-pay | Admitting: Obstetrics & Gynecology

## 2012-03-08 DIAGNOSIS — O358XX0 Maternal care for other (suspected) fetal abnormality and damage, not applicable or unspecified: Secondary | ICD-10-CM

## 2012-03-09 ENCOUNTER — Ambulatory Visit (HOSPITAL_COMMUNITY): Payer: BC Managed Care – PPO | Attending: Obstetrics and Gynecology

## 2012-03-17 ENCOUNTER — Ambulatory Visit (INDEPENDENT_AMBULATORY_CARE_PROVIDER_SITE_OTHER): Payer: BC Managed Care – PPO | Admitting: Obstetrics and Gynecology

## 2012-03-17 VITALS — BP 104/65 | Temp 97.1°F | Wt 173.4 lb

## 2012-03-17 DIAGNOSIS — O0990 Supervision of high risk pregnancy, unspecified, unspecified trimester: Secondary | ICD-10-CM

## 2012-03-17 DIAGNOSIS — O4590 Premature separation of placenta, unspecified, unspecified trimester: Secondary | ICD-10-CM

## 2012-03-17 LAB — CBC
HCT: 32.9 % — ABNORMAL LOW (ref 36.0–46.0)
Hemoglobin: 11.5 g/dL — ABNORMAL LOW (ref 12.0–15.0)
MCH: 30.8 pg (ref 26.0–34.0)
MCHC: 35 g/dL (ref 30.0–36.0)
RBC: 3.73 MIL/uL — ABNORMAL LOW (ref 3.87–5.11)

## 2012-03-17 LAB — POCT URINALYSIS DIP (DEVICE)
Bilirubin Urine: NEGATIVE
Glucose, UA: NEGATIVE mg/dL
Ketones, ur: NEGATIVE mg/dL
Leukocytes, UA: NEGATIVE
Nitrite: NEGATIVE
pH: 6.5 (ref 5.0–8.0)

## 2012-03-17 NOTE — Progress Notes (Signed)
Patient doing well without complaints. FM/PTL precautions reviewed. Patient missed MFM ultrasound on 6/12 will reschedule. 1hr GCT and labs today

## 2012-03-17 NOTE — Progress Notes (Signed)
Pulse-84  Edema-feet  Pain-back 28 week labs-  1 hr due @ [phone]

## 2012-03-17 NOTE — Progress Notes (Signed)
U/S scheduled with MFM on March 22, 2012 at 1030 am.

## 2012-03-18 LAB — RPR

## 2012-03-22 ENCOUNTER — Ambulatory Visit (HOSPITAL_COMMUNITY)
Admission: RE | Admit: 2012-03-22 | Discharge: 2012-03-22 | Disposition: A | Discharge status: Home / Self Care | Payer: BC Managed Care – PPO | Source: Ambulatory Visit | Attending: Obstetrics and Gynecology | Admitting: Obstetrics and Gynecology

## 2012-03-22 ENCOUNTER — Encounter: Payer: Self-pay | Admitting: Obstetrics and Gynecology

## 2012-03-22 DIAGNOSIS — O4590 Premature separation of placenta, unspecified, unspecified trimester: Secondary | ICD-10-CM

## 2012-03-22 DIAGNOSIS — O358XX0 Maternal care for other (suspected) fetal abnormality and damage, not applicable or unspecified: Secondary | ICD-10-CM | POA: Insufficient documentation

## 2012-03-22 DIAGNOSIS — Z1389 Encounter for screening for other disorder: Secondary | ICD-10-CM | POA: Insufficient documentation

## 2012-03-22 DIAGNOSIS — Z363 Encounter for antenatal screening for malformations: Secondary | ICD-10-CM | POA: Insufficient documentation

## 2012-03-22 DIAGNOSIS — O0990 Supervision of high risk pregnancy, unspecified, unspecified trimester: Secondary | ICD-10-CM

## 2012-04-07 ENCOUNTER — Ambulatory Visit (INDEPENDENT_AMBULATORY_CARE_PROVIDER_SITE_OTHER): Payer: BC Managed Care – PPO | Admitting: Obstetrics and Gynecology

## 2012-04-07 VITALS — BP 104/64 | Temp 98.0°F | Wt 176.2 lb

## 2012-04-07 DIAGNOSIS — O0990 Supervision of high risk pregnancy, unspecified, unspecified trimester: Secondary | ICD-10-CM

## 2012-04-07 DIAGNOSIS — O4590 Premature separation of placenta, unspecified, unspecified trimester: Secondary | ICD-10-CM

## 2012-04-07 LAB — POCT URINALYSIS DIP (DEVICE)
Bilirubin Urine: NEGATIVE
Ketones, ur: NEGATIVE mg/dL
Leukocytes, UA: NEGATIVE
Protein, ur: NEGATIVE mg/dL
Specific Gravity, Urine: 1.02 (ref 1.005–1.030)

## 2012-04-07 NOTE — Progress Notes (Signed)
Patient doing well without complaints. FM/PTL precautions reviewed. Patient planning on going to the beach for the next 2 weeks. RTC in 3 weeks

## 2012-04-07 NOTE — Progress Notes (Signed)
Pulse: 84

## 2012-04-28 ENCOUNTER — Ambulatory Visit (INDEPENDENT_AMBULATORY_CARE_PROVIDER_SITE_OTHER): Payer: BC Managed Care – PPO | Admitting: Physician Assistant

## 2012-04-28 VITALS — BP 116/68 | Temp 97.0°F | Wt 181.9 lb

## 2012-04-28 DIAGNOSIS — O0990 Supervision of high risk pregnancy, unspecified, unspecified trimester: Secondary | ICD-10-CM

## 2012-04-28 DIAGNOSIS — O4590 Premature separation of placenta, unspecified, unspecified trimester: Secondary | ICD-10-CM

## 2012-04-28 LAB — POCT URINALYSIS DIP (DEVICE)
Glucose, UA: NEGATIVE mg/dL
Ketones, ur: NEGATIVE mg/dL
Specific Gravity, Urine: 1.02 (ref 1.005–1.030)

## 2012-04-28 NOTE — Progress Notes (Signed)
Normal pregnancy complaints. + FM. Comfort measures and anticipatory guidance. Follow up US to re-eval placenta

## 2012-04-28 NOTE — Progress Notes (Signed)
U/S scheduled on May 06, 2012 at 11 am with MFM.(date per pt. Request)

## 2012-04-28 NOTE — Patient Instructions (Signed)
Pregnancy - Third Trimester The third trimester of pregnancy (the last 3 months) is a period of the most rapid growth for you and your baby. The baby approaches a length of 20 inches and a weight of 6 to 10 pounds. The baby is adding on fat and getting ready for life outside your body. While inside, babies have periods of sleeping and waking, suck their thumbs, and hiccups. You can often feel small contractions of the uterus. This is false labor. It is also called Braxton-Hicks contractions. This is like a practice for labor. The usual problems in this stage of pregnancy include more difficulty breathing, swelling of the hands and feet from water retention, and having to urinate more often because of the uterus and baby pressing on your bladder.  PRENATAL EXAMS  Blood work may continue to be done during prenatal exams. These tests are done to check on your health and the probable health of your baby. Blood work is used to follow your blood levels (hemoglobin). Anemia (low hemoglobin) is common during pregnancy. Iron and vitamins are given to help prevent this. You may also continue to be checked for diabetes. Some of the past blood tests may be done again.   The size of the uterus is measured during each visit. This makes sure your baby is growing properly according to your pregnancy dates.   Your blood pressure is checked every prenatal visit. This is to make sure you are not getting toxemia.   Your urine is checked every prenatal visit for infection, diabetes and protein.   Your weight is checked at each visit. This is done to make sure gains are happening at the suggested rate and that you and your baby are growing normally.   Sometimes, an ultrasound is performed to confirm the position and the proper growth and development of the baby. This is a test done that bounces harmless sound waves off the baby so your caregiver can more accurately determine due dates.   Discuss the type of pain  medication and anesthesia you will have during your labor and delivery.   Discuss the possibility and anesthesia if a Cesarean Section might be necessary.   Inform your caregiver if there is any mental or physical violence at home.  Sometimes, a specialized non-stress test, contraction stress test and biophysical profile are done to make sure the baby is not having a problem. Checking the amniotic fluid surrounding the baby is called an amniocentesis. The amniotic fluid is removed by sticking a needle into the belly (abdomen). This is sometimes done near the end of pregnancy if an early delivery is required. In this case, it is done to help make sure the baby's lungs are mature enough for the baby to live outside of the womb. If the lungs are not mature and it is unsafe to deliver the baby, an injection of cortisone medication is given to the mother 1 to 2 days before the delivery. This helps the baby's lungs mature and makes it safer to deliver the baby. CHANGES OCCURING IN THE THIRD TRIMESTER OF PREGNANCY Your body goes through many changes during pregnancy. They vary from person to person. Talk to your caregiver about changes you notice and are concerned about.  During the last trimester, you have probably had an increase in your appetite. It is normal to have cravings for certain foods. This varies from person to person and pregnancy to pregnancy.   You may begin to get stretch marks on your hips,   abdomen, and breasts. These are normal changes in the body during pregnancy. There are no exercises or medications to take which prevent this change.   Constipation may be treated with a stool softener or adding bulk to your diet. Drinking lots of fluids, fiber in vegetables, fruits, and whole grains are helpful.   Exercising is also helpful. If you have been very active up until your pregnancy, most of these activities can be continued during your pregnancy. If you have been less active, it is helpful  to start an exercise program such as walking. Consult your caregiver before starting exercise programs.   Avoid all smoking, alcohol, un-prescribed drugs, herbs and "street drugs" during your pregnancy. These chemicals affect the formation and growth of the baby. Avoid chemicals throughout the pregnancy to ensure the delivery of a healthy infant.   Backache, varicose veins and hemorrhoids may develop or get worse.   You will tire more easily in the third trimester, which is normal.   The baby's movements may be stronger and more often.   You may become short of breath easily.   Your belly button may stick out.   A yellow discharge may leak from your breasts called colostrum.   You may have a bloody mucus discharge. This usually occurs a few days to a week before labor begins.  HOME CARE INSTRUCTIONS   Keep your caregiver's appointments. Follow your caregiver's instructions regarding medication use, exercise, and diet.   During pregnancy, you are providing food for you and your baby. Continue to eat regular, well-balanced meals. Choose foods such as meat, fish, milk and other low fat dairy products, vegetables, fruits, and whole-grain breads and cereals. Your caregiver will tell you of the ideal weight gain.   A physical sexual relationship may be continued throughout pregnancy if there are no other problems such as early (premature) leaking of amniotic fluid from the membranes, vaginal bleeding, or belly (abdominal) pain.   Exercise regularly if there are no restrictions. Check with your caregiver if you are unsure of the safety of your exercises. Greater weight gain will occur in the last 2 trimesters of pregnancy. Exercising helps:   Control your weight.   Get you in shape for labor and delivery.   You lose weight after you deliver.   Rest a lot with legs elevated, or as needed for leg cramps or low back pain.   Wear a good support or jogging bra for breast tenderness during  pregnancy. This may help if worn during sleep. Pads or tissues may be used in the bra if you are leaking colostrum.   Do not use hot tubs, steam rooms, or saunas.   Wear your seat belt when driving. This protects you and your baby if you are in an accident.   Avoid raw meat, cat litter boxes and soil used by cats. These carry germs that can cause birth defects in the baby.   It is easier to loose urine during pregnancy. Tightening up and strengthening the pelvic muscles will help with this problem. You can practice stopping your urination while you are going to the bathroom. These are the same muscles you need to strengthen. It is also the muscles you would use if you were trying to stop from passing gas. You can practice tightening these muscles up 10 times a set and repeating this about 3 times per day. Once you know what muscles to tighten up, do not perform these exercises during urination. It is more likely   to cause an infection by backing up the urine.   Ask for help if you have financial, counseling or nutritional needs during pregnancy. Your caregiver will be able to offer counseling for these needs as well as refer you for other special needs.   Make a list of emergency phone numbers and have them available.   Plan on getting help from family or friends when you go home from the hospital.   Make a trial run to the hospital.   Take prenatal classes with the father to understand, practice and ask questions about the labor and delivery.   Prepare the baby's room/nursery.   Do not travel out of the city unless it is absolutely necessary and with the advice of your caregiver.   Wear only low or no heal shoes to have better balance and prevent falling.  MEDICATIONS AND DRUG USE IN PREGNANCY  Take prenatal vitamins as directed. The vitamin should contain 1 milligram of folic acid. Keep all vitamins out of reach of children. Only a couple vitamins or tablets containing iron may be fatal  to a baby or young child when ingested.   Avoid use of all medications, including herbs, over-the-counter medications, not prescribed or suggested by your caregiver. Only take over-the-counter or prescription medicines for pain, discomfort, or fever as directed by your caregiver. Do not use aspirin, ibuprofen (Motrin, Advil, Nuprin) or naproxen (Aleve) unless OK'd by your caregiver.   Let your caregiver also know about herbs you may be using.   Alcohol is related to a number of birth defects. This includes fetal alcohol syndrome. All alcohol, in any form, should be avoided completely. Smoking will cause low birth rate and premature babies.   Street/illegal drugs are very harmful to the baby. They are absolutely forbidden. A baby born to an addicted mother will be addicted at birth. The baby will go through the same withdrawal an adult does.  SEEK MEDICAL CARE IF: You have any concerns or worries during your pregnancy. It is better to call with your questions if you feel they cannot wait, rather than worry about them. DECISIONS ABOUT CIRCUMCISION You may or may not know the sex of your baby. If you know your baby is a boy, it may be time to think about circumcision. Circumcision is the removal of the foreskin of the penis. This is the skin that covers the sensitive end of the penis. There is no proven medical need for this. Often this decision is made on what is popular at the time or based upon religious beliefs and social issues. You can discuss these issues with your caregiver or pediatrician. SEEK IMMEDIATE MEDICAL CARE IF:   An unexplained oral temperature above 102 F (38.9 C) develops, or as your caregiver suggests.   You have leaking of fluid from the vagina (birth canal). If leaking membranes are suspected, take your temperature and tell your caregiver of this when you call.   There is vaginal spotting, bleeding or passing clots. Tell your caregiver of the amount and how many pads are  used.   You develop a bad smelling vaginal discharge with a change in the color from clear to white.   You develop vomiting that lasts more than 24 hours.   You develop chills or fever.   You develop shortness of breath.   You develop burning on urination.   You loose more than 2 pounds of weight or gain more than 2 pounds of weight or as suggested by your   caregiver.   You notice sudden swelling of your face, hands, and feet or legs.   You develop belly (abdominal) pain. Round ligament discomfort is a common non-cancerous (benign) cause of abdominal pain in pregnancy. Your caregiver still must evaluate you.   You develop a severe headache that does not go away.   You develop visual problems, blurred or double vision.   If you have not felt your baby move for more than 1 hour. If you think the baby is not moving as much as usual, eat something with sugar in it and lie down on your left side for an hour. The baby should move at least 4 to 5 times per hour. Call right away if your baby moves less than that.   You fall, are in a car accident or any kind of trauma.   There is mental or physical violence at home.  Document Released: 09/08/2001 Document Revised: 09/03/2011 Document Reviewed: 03/13/2009 [employer] Patient Information 2012 [employer]. 

## 2012-04-28 NOTE — Progress Notes (Signed)
Pulse- 94  Edema- feet

## 2012-05-06 ENCOUNTER — Ambulatory Visit (HOSPITAL_COMMUNITY)
Admission: RE | Admit: 2012-05-06 | Discharge: 2012-05-06 | Disposition: A | Discharge status: Home / Self Care | Payer: BC Managed Care – PPO | Source: Ambulatory Visit | Attending: Family Medicine | Admitting: Family Medicine

## 2012-05-06 VITALS — BP 109/69 | HR 102 | Wt 185.0 lb

## 2012-05-06 DIAGNOSIS — O0990 Supervision of high risk pregnancy, unspecified, unspecified trimester: Secondary | ICD-10-CM

## 2012-05-06 DIAGNOSIS — O4590 Premature separation of placenta, unspecified, unspecified trimester: Secondary | ICD-10-CM | POA: Insufficient documentation

## 2012-05-06 DIAGNOSIS — O352XX0 Maternal care for (suspected) hereditary disease in fetus, not applicable or unspecified: Secondary | ICD-10-CM | POA: Insufficient documentation

## 2012-05-06 DIAGNOSIS — O358XX0 Maternal care for other (suspected) fetal abnormality and damage, not applicable or unspecified: Secondary | ICD-10-CM

## 2012-05-06 NOTE — Progress Notes (Signed)
Patient seen today  for follow up ultrasound.  See full report in [facility].  Alpha Gula, MD  Single IUP at 32 4/7 weeks Mild ventriculomegaly noted (1.1 mm).  The remainder of the fetal anatomy is within normal limits.  No evidence of clubbed feet noted. Interval growth is appropriate  (75th %tile) Extenting laterally from the right anterior edge of the placenta are multiple small sinusoidal-like structures - somewhat smaller than on previous ultrasound.  Its clinical significance uncertain.  Recommend follow up ultrasound in 3 weeks.   If ventriculomegaly is persistent, would recommend imaging studies of the newborn after delivery.

## 2012-05-19 ENCOUNTER — Encounter: Payer: BC Managed Care – PPO | Admitting: Family Medicine

## 2012-05-23 ENCOUNTER — Telehealth: Payer: Self-pay | Admitting: General Practice

## 2012-05-23 NOTE — Telephone Encounter (Signed)
Called pt and pt informed me that she is already taking liquid tylenol and it has helped with her cough.  I advised pt that it is ok take the Tylenol to please bring it to her appt on Thursday so that the nurse can document it.  Pt then asked what else she can do.  I advised pt that she has summer cold to let it run its course, drink plenty of fluids, get plenty of rest, and that she should continue to take the Tylenol.  Pt stated understanding and had no further questions.

## 2012-05-23 NOTE — Telephone Encounter (Signed)
Pt called stating she has a cold and wants to know if we can call her in a Rx for antibiotics before her appt on Thursday and would like a call back

## 2012-05-24 ENCOUNTER — Inpatient Hospital Stay (HOSPITAL_COMMUNITY)
Admission: AD | Admit: 2012-05-24 | Discharge: 2012-05-24 | Disposition: A | Discharge status: Home / Self Care | Payer: BC Managed Care – PPO | Source: Ambulatory Visit | Attending: Obstetrics & Gynecology | Admitting: Obstetrics & Gynecology

## 2012-05-24 ENCOUNTER — Encounter (HOSPITAL_COMMUNITY): Payer: Self-pay | Admitting: Advanced Practice Midwife

## 2012-05-24 DIAGNOSIS — J45901 Unspecified asthma with (acute) exacerbation: Secondary | ICD-10-CM | POA: Insufficient documentation

## 2012-05-24 DIAGNOSIS — O99891 Other specified diseases and conditions complicating pregnancy: Secondary | ICD-10-CM | POA: Insufficient documentation

## 2012-05-24 DIAGNOSIS — J069 Acute upper respiratory infection, unspecified: Secondary | ICD-10-CM

## 2012-05-24 MED ORDER — AZITHROMYCIN 250 MG PO TABS: ORAL_TABLET | ORAL | Status: AC

## 2012-05-24 MED ORDER — LORATADINE 10 MG PO TABS: 10.0000 mg | ORAL_TABLET | Freq: Every day | ORAL | Status: DC

## 2012-05-24 MED ORDER — GUAIFENESIN ER 600 MG PO TB12: 1200.0000 mg | ORAL_TABLET | Freq: Two times a day (BID) | ORAL | Status: DC

## 2012-05-24 MED ORDER — ALBUTEROL SULFATE HFA 108 (90 BASE) MCG/ACT IN AERS
2.0000 | INHALATION_SPRAY | Freq: Once | RESPIRATORY_TRACT | Status: DC
  Filled 2012-05-24: qty 6.7

## 2012-05-24 MED ORDER — MOMETASONE FUROATE 50 MCG/ACT NA SUSP: 2.0000 | Freq: Every day | NASAL | Status: DC

## 2012-05-24 NOTE — MAU Provider Note (Signed)
History     CSN: [id number]  Arrival date and time: 05/24/12 1516   First Provider Initiated Contact with Patient 05/24/12 1722      Chief Complaint  Patient presents with  . Cough  . Sore Throat  . Nasal Congestion  . Pleurisy   HPI 26 y.o. G2P1001 at [redacted]w[redacted]d with nasal congestion, sinus pain, headache, sore throat and cough x 3 days. Taking a tylenol multi-symptom cold medicine with little relief. No fever. Patient states she does not have asthma, but does require an inhaler about 4 or 5 times per year. States she has not been using it during this illness because it "doesn't work".    Past Medical History  Diagnosis Date  . Seasonal depression   . No pertinent past medical history     Past Surgical History  Procedure Date  . No past surgeries     Family History  Problem Relation Age of Onset  . Cancer Father   . Cancer Maternal Grandmother     History  Substance Use Topics  . Smoking status: Former Smoker    Types: Cigarettes    Quit date: 01/22/2012  . Smokeless tobacco: Not on file  . Alcohol Use: No    Allergies: No Known Allergies  Prescriptions prior to admission  Medication Sig Dispense Refill  . acetaminophen (TYLENOL) 500 MG tablet Take 500 mg by mouth every 6 (six) hours as needed. For pain      . albuterol (PROVENTIL HFA;VENTOLIN HFA) 108 (90 BASE) MCG/ACT inhaler Inhale 2 puffs into the lungs every 6 (six) hours as needed. For shortness of breath      . DM-Phenylephrine-Acetaminophen (TYLENOL COLD MULTI-SYMPTOM) 10-5-325 MG/15ML LIQD Take 15-30 mLs by mouth every 6 (six) hours as needed. For congestion/cough      . Prenatal Vit-Fe Fumarate-FA (PRENATAL MULTIVITAMIN) TABS Take 1 tablet by mouth daily.        Review of Systems  Constitutional: Negative.  Negative for fever.  HENT: Positive for congestion and sore throat.   Respiratory: Positive for cough. Negative for shortness of breath and wheezing.   Cardiovascular: Negative.  Negative for  chest pain.  Gastrointestinal: Negative for nausea, vomiting, abdominal pain, diarrhea and constipation.  Genitourinary: Negative for dysuria, urgency, frequency, hematuria and flank pain.       Negative for vaginal bleeding, cramping/contractions  Musculoskeletal: Negative.   Neurological: Positive for headaches.  Psychiatric/Behavioral: Negative.    Physical Exam   Blood pressure 106/71, pulse 89, temperature 98.2 F (36.8 C), temperature source Oral, resp. rate 20, height 5\' 6"  (1.676 m), weight 186 lb 9.6 oz (84.641 kg), last menstrual period 09/12/2011, SpO2 98.00%.  Physical Exam  Nursing note and vitals reviewed. Constitutional: She is oriented to person, place, and time. She appears well-developed and well-nourished. No distress.  HENT:  Right Ear: External ear normal.  Left Ear: External ear normal.  Mouth/Throat: Oropharynx is clear and moist. No oropharyngeal exudate.  Neck: Normal range of motion. Neck supple.  Cardiovascular: Normal rate, regular rhythm and normal heart sounds.   Respiratory: Effort normal. No stridor. She has wheezes (bilateral expiratory).  GI: Soft. Bowel sounds are normal.  Musculoskeletal: Normal range of motion.  Lymphadenopathy:    She has no cervical adenopathy.  Neurological: She is alert and oriented to person, place, and time.  Skin: Skin is warm.  Psychiatric: She has a normal mood and affect.    MAU Course  Procedures  Albuterol inhaler ordered for patient while in MAU,  pt refused to use inhaler, states, "I don't have asthma, I have an allergy infection"   Assessment and Plan  26 y.o. G2P1001 at [redacted]w[redacted]d 1. URI (upper respiratory infection)    Medication List  As of 05/26/2012  5:20 PM   START taking these medications         azithromycin 250 MG tablet   Commonly known as: ZITHROMAX   2 tabs po on day 1, then 1 tab po x 4 days      guaiFENesin 600 MG 12 hr tablet   Commonly known as: MUCINEX   Take 2 tablets (1,200 mg total) by  mouth 2 (two) times daily.      loratadine 10 MG tablet   Commonly known as: CLARITIN   Take 1 tablet (10 mg total) by mouth daily.      mometasone 50 MCG/ACT nasal spray   Commonly known as: NASONEX   Place 2 sprays into the nose daily.         CONTINUE taking these medications         acetaminophen 500 MG tablet   Commonly known as: TYLENOL      albuterol 108 (90 BASE) MCG/ACT inhaler   Commonly known as: PROVENTIL HFA;VENTOLIN HFA      prenatal multivitamin Tabs      TYLENOL COLD MULTI-SYMPTOM 10-5-325 MG/15ML Liqd   Generic drug: DM-Phenylephrine-Acetaminophen          Where to get your medications    These are the prescriptions that you need to pick up. We sent them to a specific pharmacy, so you will need to go there to get them.   [id number] [place], [place] - [address]    [address] [place] [address]    Phone: [phone]        guaiFENesin 600 MG 12 hr tablet         You may get these medications from any pharmacy.         azithromycin 250 MG tablet   loratadine 10 MG tablet   mometasone 50 MCG/ACT nasal spray           F/U as scheduled   FRAZIER,NATALIE 05/24/2012, 6:13 PM

## 2012-05-24 NOTE — MAU Note (Signed)
Pt complaining of upper resp congestion in head, nose, with a cough
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
## 2012-05-24 NOTE — MAU Note (Signed)
Patient states she has had cough, sore throat and head congestion since 8-24. Denies any contractions, bleeding or leaking and reports good fetal movement.

## 2012-05-26 ENCOUNTER — Encounter: Payer: Self-pay | Admitting: Family Medicine

## 2012-05-26 ENCOUNTER — Ambulatory Visit (INDEPENDENT_AMBULATORY_CARE_PROVIDER_SITE_OTHER): Payer: BC Managed Care – PPO | Admitting: Family Medicine

## 2012-05-26 VITALS — BP 110/71 | Temp 98.9°F | Wt 185.6 lb

## 2012-05-26 DIAGNOSIS — O4590 Premature separation of placenta, unspecified, unspecified trimester: Secondary | ICD-10-CM

## 2012-05-26 DIAGNOSIS — O358XX0 Maternal care for other (suspected) fetal abnormality and damage, not applicable or unspecified: Secondary | ICD-10-CM

## 2012-05-26 LAB — POCT URINALYSIS DIP (DEVICE)
Bilirubin Urine: NEGATIVE
Glucose, UA: NEGATIVE mg/dL
Ketones, ur: NEGATIVE mg/dL
pH: 6.5 (ref 5.0–8.0)

## 2012-05-26 NOTE — Progress Notes (Signed)
U/S with MFM scheduled Sept. 20, 2013 at 930 am. (patient can only do Fridays)

## 2012-05-26 NOTE — Progress Notes (Signed)
Pulse: 93

## 2012-05-26 NOTE — Progress Notes (Signed)
Doing well--needs f/u growth scan with MFM. To check ventriculomegaly

## 2012-05-26 NOTE — Patient Instructions (Signed)

## 2012-05-26 NOTE — ED Provider Notes (Signed)
Attestation of Attending Supervision of Advanced Practitioner (CNM/NP): Evaluation and management procedures were performed by the Advanced Practitioner under my supervision and collaboration.  I have reviewed the Advanced Practitioner's note and chart, and I agree with the management and plan.  HARRAWAY-SMITH, Cobie Marcoux 3:01 PM

## 2012-05-31 NOTE — MAU Provider Note (Signed)
Attestation of Attending Supervision of Advanced Practitioner (CNM/NP): Evaluation and management procedures were performed by the Advanced Practitioner under my supervision and collaboration.  I have reviewed the Advanced Practitioner's note and chart, and I agree with the management and plan.  HARRAWAY-SMITH, Mazzy Santarelli 8:50 AM

## 2012-06-09 ENCOUNTER — Ambulatory Visit (INDEPENDENT_AMBULATORY_CARE_PROVIDER_SITE_OTHER): Payer: BC Managed Care – PPO | Admitting: Obstetrics and Gynecology

## 2012-06-09 ENCOUNTER — Encounter (HOSPITAL_COMMUNITY): Payer: Self-pay | Admitting: Anesthesiology

## 2012-06-09 ENCOUNTER — Encounter (HOSPITAL_COMMUNITY): Payer: Self-pay | Admitting: *Deleted

## 2012-06-09 ENCOUNTER — Inpatient Hospital Stay (HOSPITAL_COMMUNITY)
Admission: AD | Admit: 2012-06-09 | Discharge: 2012-06-11 | DRG: 372 | Disposition: A | Discharge status: Home / Self Care | Payer: BC Managed Care – PPO | Source: Ambulatory Visit | Attending: Obstetrics & Gynecology | Admitting: Obstetrics & Gynecology

## 2012-06-09 ENCOUNTER — Inpatient Hospital Stay (HOSPITAL_COMMUNITY): Payer: BC Managed Care – PPO | Admitting: Anesthesiology

## 2012-06-09 VITALS — BP 116/72 | Temp 97.3°F | Wt 187.0 lb

## 2012-06-09 DIAGNOSIS — O0990 Supervision of high risk pregnancy, unspecified, unspecified trimester: Secondary | ICD-10-CM

## 2012-06-09 DIAGNOSIS — O358XX0 Maternal care for other (suspected) fetal abnormality and damage, not applicable or unspecified: Secondary | ICD-10-CM

## 2012-06-09 DIAGNOSIS — O4590 Premature separation of placenta, unspecified, unspecified trimester: Secondary | ICD-10-CM

## 2012-06-09 LAB — CBC
HCT: 34.8 % — ABNORMAL LOW (ref 36.0–46.0)
Hemoglobin: 12.1 g/dL (ref 12.0–15.0)
MCH: 30.9 pg (ref 26.0–34.0)
MCHC: 34.8 g/dL (ref 30.0–36.0)
MCV: 89 fL (ref 78.0–100.0)
RDW: 13 % (ref 11.5–15.5)

## 2012-06-09 LAB — OB RESULTS CONSOLE GBS: GBS: NEGATIVE

## 2012-06-09 LAB — GROUP B STREP BY PCR: Group B strep by PCR: NEGATIVE

## 2012-06-09 LAB — POCT URINALYSIS DIP (DEVICE)
Bilirubin Urine: NEGATIVE
Ketones, ur: NEGATIVE mg/dL
Protein, ur: NEGATIVE mg/dL
Specific Gravity, Urine: 1.01 (ref 1.005–1.030)

## 2012-06-09 MED ORDER — ZOLPIDEM TARTRATE 5 MG PO TABS: 5.0000 mg | ORAL_TABLET | Freq: Every evening | ORAL | Status: DC | PRN

## 2012-06-09 MED ORDER — NALBUPHINE SYRINGE 5 MG/0.5 ML
5.0000 mg | INJECTION | Freq: Once | INTRAMUSCULAR | Status: AC
  Administered 2012-06-09: 5 mg via INTRAVENOUS
  Filled 2012-06-09: qty 0.5

## 2012-06-09 MED ORDER — IBUPROFEN 600 MG PO TABS
600.0000 mg | ORAL_TABLET | Freq: Four times a day (QID) | ORAL | Status: DC
  Administered 2012-06-10 – 2012-06-11 (×7): 600 mg via ORAL
  Filled 2012-06-09 (×7): qty 1

## 2012-06-09 MED ORDER — FLEET ENEMA 7-19 GM/118ML RE ENEM: 1.0000 | ENEMA | RECTAL | Status: DC | PRN

## 2012-06-09 MED ORDER — ONDANSETRON HCL 4 MG/2ML IJ SOLN: 4.0000 mg | INTRAMUSCULAR | Status: DC | PRN

## 2012-06-09 MED ORDER — OXYTOCIN BOLUS FROM INFUSION
500.0000 mL | Freq: Once | INTRAVENOUS | Status: AC
  Administered 2012-06-09: 500 mL via INTRAVENOUS
  Filled 2012-06-09: qty 500

## 2012-06-09 MED ORDER — LACTATED RINGERS IV SOLN: 500.0000 mL | INTRAVENOUS | Status: DC | PRN

## 2012-06-09 MED ORDER — OXYCODONE-ACETAMINOPHEN 5-325 MG PO TABS: 1.0000 | ORAL_TABLET | ORAL | Status: DC | PRN

## 2012-06-09 MED ORDER — FENTANYL 2.5 MCG/ML BUPIVACAINE 1/10 % EPIDURAL INFUSION (WH - ANES)
14.0000 mL/h | INTRAMUSCULAR | Status: DC
  Administered 2012-06-09: 14 mL/h via EPIDURAL
  Filled 2012-06-09: qty 60

## 2012-06-09 MED ORDER — LIDOCAINE HCL (PF) 1 % IJ SOLN
INTRAMUSCULAR | Status: DC | PRN
  Administered 2012-06-09 (×4): 4 mL

## 2012-06-09 MED ORDER — LANOLIN HYDROUS EX OINT: TOPICAL_OINTMENT | CUTANEOUS | Status: DC | PRN

## 2012-06-09 MED ORDER — OXYCODONE-ACETAMINOPHEN 5-325 MG PO TABS
1.0000 | ORAL_TABLET | ORAL | Status: DC | PRN
  Administered 2012-06-10 (×3): 1 via ORAL
  Filled 2012-06-09 (×3): qty 1

## 2012-06-09 MED ORDER — DIPHENHYDRAMINE HCL 50 MG/ML IJ SOLN: 12.5000 mg | INTRAMUSCULAR | Status: DC | PRN

## 2012-06-09 MED ORDER — WITCH HAZEL-GLYCERIN EX PADS: 1.0000 "application " | MEDICATED_PAD | CUTANEOUS | Status: DC | PRN

## 2012-06-09 MED ORDER — SIMETHICONE 80 MG PO CHEW: 80.0000 mg | CHEWABLE_TABLET | ORAL | Status: DC | PRN

## 2012-06-09 MED ORDER — ACETAMINOPHEN 325 MG PO TABS: 650.0000 mg | ORAL_TABLET | ORAL | Status: DC | PRN

## 2012-06-09 MED ORDER — LACTATED RINGERS IV SOLN
500.0000 mL | Freq: Once | INTRAVENOUS | Status: AC
  Administered 2012-06-09: 19:00:00 via INTRAVENOUS

## 2012-06-09 MED ORDER — EPHEDRINE 5 MG/ML INJ
10.0000 mg | INTRAVENOUS | Status: DC | PRN
  Filled 2012-06-09: qty 4

## 2012-06-09 MED ORDER — BENZOCAINE-MENTHOL 20-0.5 % EX AERO
1.0000 "application " | INHALATION_SPRAY | CUTANEOUS | Status: DC | PRN
  Filled 2012-06-09: qty 56

## 2012-06-09 MED ORDER — PRENATAL MULTIVITAMIN CH: 1.0000 | ORAL_TABLET | Freq: Every day | ORAL | Status: DC

## 2012-06-09 MED ORDER — PRENATAL MULTIVITAMIN CH
1.0000 | ORAL_TABLET | Freq: Every day | ORAL | Status: DC
  Administered 2012-06-11: 1 via ORAL
  Filled 2012-06-09: qty 1

## 2012-06-09 MED ORDER — EPHEDRINE 5 MG/ML INJ: 10.0000 mg | INTRAVENOUS | Status: DC | PRN

## 2012-06-09 MED ORDER — LIDOCAINE HCL (PF) 1 % IJ SOLN
30.0000 mL | INTRAMUSCULAR | Status: DC | PRN
  Filled 2012-06-09: qty 30

## 2012-06-09 MED ORDER — DIPHENHYDRAMINE HCL 25 MG PO CAPS
25.0000 mg | ORAL_CAPSULE | Freq: Four times a day (QID) | ORAL | Status: DC | PRN
  Filled 2012-06-09: qty 1

## 2012-06-09 MED ORDER — ONDANSETRON HCL 4 MG PO TABS
4.0000 mg | ORAL_TABLET | ORAL | Status: DC | PRN
  Administered 2012-06-10: 4 mg via ORAL
  Filled 2012-06-09: qty 1

## 2012-06-09 MED ORDER — TETANUS-DIPHTH-ACELL PERTUSSIS 5-2.5-18.5 LF-MCG/0.5 IM SUSP
0.5000 mL | Freq: Once | INTRAMUSCULAR | Status: AC
  Administered 2012-06-10: 0.5 mL via INTRAMUSCULAR
  Filled 2012-06-09: qty 0.5

## 2012-06-09 MED ORDER — CITRIC ACID-SODIUM CITRATE 334-500 MG/5ML PO SOLN: 30.0000 mL | ORAL | Status: DC | PRN

## 2012-06-09 MED ORDER — LACTATED RINGERS IV SOLN
INTRAVENOUS | Status: DC
  Administered 2012-06-09: 19:00:00 via INTRAVENOUS

## 2012-06-09 MED ORDER — IBUPROFEN 600 MG PO TABS: 600.0000 mg | ORAL_TABLET | Freq: Four times a day (QID) | ORAL | Status: DC | PRN

## 2012-06-09 MED ORDER — DIBUCAINE 1 % RE OINT: 1.0000 "application " | TOPICAL_OINTMENT | RECTAL | Status: DC | PRN

## 2012-06-09 MED ORDER — SENNOSIDES-DOCUSATE SODIUM 8.6-50 MG PO TABS
2.0000 | ORAL_TABLET | Freq: Every day | ORAL | Status: DC
  Administered 2012-06-10: 2 via ORAL

## 2012-06-09 MED ORDER — PHENYLEPHRINE 40 MCG/ML (10ML) SYRINGE FOR IV PUSH (FOR BLOOD PRESSURE SUPPORT)
80.0000 ug | PREFILLED_SYRINGE | INTRAVENOUS | Status: DC | PRN
  Filled 2012-06-09: qty 5

## 2012-06-09 MED ORDER — LORATADINE 10 MG PO TABS
10.0000 mg | ORAL_TABLET | Freq: Every day | ORAL | Status: DC
  Administered 2012-06-10 – 2012-06-11 (×2): 10 mg via ORAL
  Filled 2012-06-09 (×3): qty 1

## 2012-06-09 MED ORDER — ONDANSETRON HCL 4 MG/2ML IJ SOLN
4.0000 mg | Freq: Four times a day (QID) | INTRAMUSCULAR | Status: DC | PRN
  Administered 2012-06-09: 4 mg via INTRAVENOUS
  Filled 2012-06-09: qty 2

## 2012-06-09 MED ORDER — PHENYLEPHRINE 40 MCG/ML (10ML) SYRINGE FOR IV PUSH (FOR BLOOD PRESSURE SUPPORT): 80.0000 ug | PREFILLED_SYRINGE | INTRAVENOUS | Status: DC | PRN

## 2012-06-09 MED ORDER — OXYTOCIN 40 UNITS IN LACTATED RINGERS INFUSION - SIMPLE MED
62.5000 mL/h | Freq: Once | INTRAVENOUS | Status: AC
  Administered 2012-06-09: 62.5 mL/h via INTRAVENOUS
  Filled 2012-06-09: qty 1000

## 2012-06-09 MED ORDER — ALBUTEROL SULFATE HFA 108 (90 BASE) MCG/ACT IN AERS
2.0000 | INHALATION_SPRAY | Freq: Four times a day (QID) | RESPIRATORY_TRACT | Status: DC | PRN
  Filled 2012-06-09: qty 6.7

## 2012-06-09 NOTE — Progress Notes (Signed)
Patient doing well without complaints. Cultures collected. FM/labor precautions reviewed. F/U MFM ultrasound on 9/20. Patient declined TDap today

## 2012-06-09 NOTE — Anesthesia Procedure Notes (Signed)
Epidural Patient location during procedure: OB Start time: 06/09/2012 7:34 PM  Staffing Performed by: anesthesiologist   Preanesthetic Checklist Completed: patient identified, site marked, surgical consent, pre-op evaluation, timeout performed, IV checked, risks and benefits discussed and monitors and equipment checked  Epidural Patient position: sitting Prep: site prepped and draped and DuraPrep Patient monitoring: continuous pulse ox and blood pressure Approach: midline Injection technique: LOR air  Needle:  Needle type: Tuohy  Needle gauge: 17 G Needle length: 9 cm and 9 Needle insertion depth: 6.5 cm Catheter type: closed end flexible Catheter size: 19 Gauge Catheter at skin depth: 11.5 cm Test dose: negative  Assessment Events: blood not aspirated, injection not painful, no injection resistance, negative IV test and no paresthesia  Additional Notes Discussed risk of headache, infection, bleeding, nerve injury and failed or incomplete block.  Patient voices understanding and wishes to proceed. [email or web address] for block:procedure for pain

## 2012-06-09 NOTE — Anesthesia Preprocedure Evaluation (Addendum)
Anesthesia Evaluation  Patient identified by MRN, date of birth, ID band Patient awake    Reviewed: Allergy & Precautions, H&P , NPO status , Patient's Chart, lab work & pertinent test results, reviewed documented beta blocker date and time   History of Anesthesia Complications Negative for: history of anesthetic complications  Airway Mallampati: I TM Distance: >3 FB Neck ROM: full    Dental  (+) Teeth Intact   Pulmonary asthma (inhaler use 5x/year) , former smoker,  breath sounds clear to auscultation        Cardiovascular negative cardio ROS  Rhythm:regular Rate:Normal     Neuro/Psych negative neurological ROS  negative psych ROS   GI/Hepatic negative GI ROS, Neg liver ROS,   Endo/Other  negative endocrine ROS  Renal/GU negative Renal ROS     Musculoskeletal   Abdominal   Peds  Hematology negative hematology ROS (+)   Anesthesia Other Findings   Reproductive/Obstetrics (+) Pregnancy                          Anesthesia Physical Anesthesia Plan  ASA: II  Anesthesia Plan: Epidural   Post-op Pain Management:    Induction:   Airway Management Planned:   Additional Equipment:   Intra-op Plan:   Post-operative Plan:   Informed Consent: I have reviewed the patients History and Physical, chart, labs and discussed the procedure including the risks, benefits and alternatives for the proposed anesthesia with the patient or authorized representative who has indicated his/her understanding and acceptance.     Plan Discussed with:   Anesthesia Plan Comments:         Anesthesia Quick Evaluation

## 2012-06-09 NOTE — H&P (Signed)
Laura Guerra is a 26 y.o. female presenting for labor . Maternal Medical History:  Reason for admission: Reason for admission: contractions.  Reason for Admission:   nauseaContractions: Onset was 3-5 hours ago.   Perceived severity is moderate.    Fetal activity: Perceived fetal activity is normal.   Last perceived fetal movement was within the past hour.    Prenatal complications: No bleeding.   Prenatal Complications - Diabetes: none.    OB History    Grav Para Term Preterm Abortions TAB SAB Ect Mult Living   2 1 1  0 0 0 0 0 0 1     Past Medical History  Diagnosis Date  . Seasonal depression   . No pertinent past medical history    Past Surgical History  Procedure Date  . No past surgeries    Family History: family history includes Cancer in her father and maternal grandmother. Social History:  reports that she quit smoking about 4 months ago. Her smoking use included Cigarettes. She does not have any smokeless tobacco history on file. She reports that she does not drink alcohol or use illicit drugs.   Prenatal Transfer Tool  Maternal Diabetes: No Genetic Screening: Normal Maternal Ultrasounds/Referrals: Abnormal:  Findings:   Fetal Heart Anomalies Fetal Ultrasounds or other Referrals:  None Maternal Substance Abuse:  No Significant Maternal Medications:  None Significant Maternal Lab Results:  None Other Comments:  None  Review of Systems  Constitutional: Negative for fever and chills.  Gastrointestinal: Positive for abdominal pain. Negative for nausea, vomiting, diarrhea and constipation.  Genitourinary: Negative for dysuria.    Dilation: 4.5 Effacement (%): 70 Station: -2;-1 Exam by:: K.Wilson,RN Blood pressure 110/71, pulse 92, temperature 97.9 F (36.6 C), temperature source Oral, resp. rate 16, height 5\' 6"  (1.676 m), weight 189 lb 12.8 oz (86.093 kg), last menstrual period 09/12/2011, SpO2 100.00%. Maternal Exam:  Uterine Assessment:  Contraction strength is moderate.  Contraction frequency is regular.   Abdomen: Fundal height is 40.   Estimated fetal weight is 7.5.   Fetal presentation: vertex  Introitus: Normal vulva. Normal vagina.  Vagina is negative for discharge.  Ferning test: not done.  Nitrazine test: not done. Amniotic fluid character: not assessed.  Pelvis: adequate for delivery.   Cervix: Cervix evaluated by digital exam.     Fetal Exam Fetal Monitor Review: Mode: ultrasound.   Baseline rate: 140.  Variability: moderate (6-25 bpm).   Pattern: accelerations present and no decelerations.    Fetal State Assessment: Category I - tracings are normal.     Physical Exam  Constitutional: She is oriented to person, place, and time. She appears well-developed and well-nourished. No distress.  Cardiovascular: Normal rate.   Respiratory: Effort normal.  GI: Soft. She exhibits no distension and no mass. There is no tenderness. There is no rebound and no guarding.  Genitourinary: Vagina normal and uterus normal. No vaginal discharge found.  Musculoskeletal: Normal range of motion.  Neurological: She is alert and oriented to person, place, and time.  Skin: Skin is warm and dry.  Psychiatric: She has a normal mood and affect.    Dilation: 4.5 Effacement (%): 70 Cervical Position: Posterior Station: -2;-1 Presentation: Vertex Exam by:: K.Wilson,RN (was 3cm)  Prenatal labs: ABO, Rh: O/POS/-- (05/09 0930) Antibody: NEG (05/09 0930) Rubella: 29.2 (05/09 0930) RPR: NON REAC (06/20 0905)  HBsAg: NEGATIVE (05/09 0930)  HIV: NON REACTIVE (06/20 0905)  GBS: Negative (09/12 0000)   Assessment/Plan: A:  SIUP at [redacted]w[redacted]d  Active labor      Stable marginal abruption      Fetal ventriculomegaly, mild  P:  Admit to [employer] [employer]      Routine orders      Amniotomy to augment labor -- done. Clear fluid. Cervix 5/C/-1/vtx   Wynelle Bourgeois 06/09/2012, 6:42 PM

## 2012-06-09 NOTE — H&P (Signed)
Attestation of Attending Supervision of Advanced Practitioner (CNM/NP): Evaluation and management procedures were performed by the Advanced Practitioner under my supervision and collaboration.  I have reviewed the Advanced Practitioner's note and chart, and I agree with the management and plan.  Kemond Amorin, MD, FACOG Attending Obstetrician & Gynecologist Faculty Practice, [facility]  

## 2012-06-09 NOTE — Progress Notes (Signed)
:  P=83, c/o edema in hands, feet, states feels baby moving often, but doesn't feel like kicks anymore. States Tuesday at 11:54am started feeling contractions every 4-7 minutes for 2 hours and then quit.cp achy and back hurts. C/o pain in upper back. Discussed TDAP , pt. Desires but states she doesn't feel that good today, still recovering from being sick. May wait until next visit.

## 2012-06-09 NOTE — MAU Note (Signed)
Patient states she is having contractions every 3-5 minutes. Denies any bleeding or leaking. Reports fetal movement. Was 3cm in the clinic this morning.

## 2012-06-10 DIAGNOSIS — O4590 Premature separation of placenta, unspecified, unspecified trimester: Secondary | ICD-10-CM

## 2012-06-10 LAB — CBC
MCHC: 34.5 g/dL (ref 30.0–36.0)
Platelets: 199 10*3/uL (ref 150–400)
RDW: 13.1 % (ref 11.5–15.5)

## 2012-06-10 LAB — GC/CHLAMYDIA PROBE AMP, GENITAL: GC Probe Amp, Genital: NEGATIVE

## 2012-06-10 NOTE — Progress Notes (Signed)
  Post Partum Day 1 Subjective: no complaints, voiding and tolerating PO  Mild vaginal pain, requests ibuprofen.  Objective: Blood pressure 97/56, pulse 70, temperature 98.2 F (36.8 C), temperature source Oral, resp. rate 16, height 5\' 6"  (1.676 m), weight 86.093 kg (189 lb 12.8 oz), last menstrual period 09/12/2011, SpO2 97.00%, unknown if currently breastfeeding.  Physical Exam:  General: alert, cooperative and no distress Lochia: appropriate Uterine Fundus: firm Incision: NA DVT Evaluation: No evidence of DVT seen on physical exam. No cords or calf tenderness. No significant calf/ankle edema. CVS exam: normal rate, regular rhythm, normal S1, S2, no murmurs, rubs, clicks or gallops.  Pedal pulses intact. Lungs - Normal respiratory effort, chest expands symmetrically. Lungs are clear to auscultation, no crackles or wheezes.   Basename 06/10/12 0505 06/09/12 1830  HGB 11.5* 12.1  HCT 33.3* 34.8*    Assessment/Plan: Plan for discharge tomorrow Plans Mirana IUD for contraception, Breast/bottle feed  LOS: 1 day   Doran Heater 06/10/2012, 7:30 AM

## 2012-06-10 NOTE — Anesthesia Postprocedure Evaluation (Signed)
  Anesthesia Post-op Note  Patient: Laura Guerra  Procedure(s) Performed: * No procedures listed *  Patient Location: Mother/Baby  Anesthesia Type: Epidural  Level of Consciousness: awake, alert  and oriented  Airway and Oxygen Therapy: Patient Spontanous Breathing  Post-op Pain: none  Post-op Assessment: Post-op Vital signs reviewed, Patient's Cardiovascular Status Stable, No headache, No backache, No residual numbness and No residual motor weakness  Post-op Vital Signs: Reviewed and stable  Complications: No apparent anesthesia complications

## 2012-06-10 NOTE — Progress Notes (Signed)
I have seen and examined patient and agree with student note.  Napoleon Form, MD 06/10/2012 9:25 AM

## 2012-06-11 MED ORDER — IBUPROFEN 600 MG PO TABS: 600.0000 mg | ORAL_TABLET | Freq: Four times a day (QID) | ORAL | Status: AC

## 2012-06-11 MED ORDER — SENNOSIDES-DOCUSATE SODIUM 8.6-50 MG PO TABS: 2.0000 | ORAL_TABLET | Freq: Every day | ORAL | Status: DC

## 2012-06-11 NOTE — Discharge Summary (Addendum)
Obstetric Discharge Summary Reason for Admission: onset of labor Prenatal Procedures: ultrasound Intrapartum Procedures: spontaneous vaginal delivery Postpartum Procedures: none Complications-Operative and Postpartum: none Hemoglobin  Date Value Range Status  06/10/2012 11.5* 12.0 - 15.0 g/dL Final  1/61/0960 45.4  12.2 - 16.2 g/dL Final     HCT  Date Value Range Status  06/10/2012 33.3* 36.0 - 46.0 % Final     HCT, POC  Date Value Range Status  11/15/2011 41.4  37.7 - 47.9 % Final    Physical Exam:  General: alert, cooperative and no distress Lochia: appropriate Uterine Fundus: firm Incision: n/a DVT Evaluation: No evidence of DVT seen on physical exam.  Discharge Diagnoses: Term Pregnancy-delivered  Discharge Information: Date: 06/11/2012 Activity: pelvic rest Diet: routine Medications: PNV, Ibuprofen and Colace Condition: stable Instructions: refer to practice specific booklet Discharge to: home   Newborn Data: Live born female  Birth Weight: 7 lb 3.3 oz (3269 g) APGAR: 8, 9  Home with mother.  MCGILL,JACQUELYN 06/11/2012, 7:22 AM

## 2012-06-11 NOTE — Discharge Summary (Signed)
I examined patient and agree with resident's note and plan of care.  Aidin Doane R. Silvia Hightower, CNM, WHNP-BC  

## 2012-06-12 LAB — CULTURE, BETA STREP (GROUP B ONLY)

## 2012-06-13 ENCOUNTER — Encounter: Payer: Self-pay | Admitting: Obstetrics & Gynecology

## 2012-06-16 ENCOUNTER — Encounter: Payer: BC Managed Care – PPO | Admitting: Family Medicine

## 2012-06-17 ENCOUNTER — Ambulatory Visit (HOSPITAL_COMMUNITY): Payer: BC Managed Care – PPO

## 2012-06-20 NOTE — Discharge Summary (Signed)
Attestation of Attending Supervision of Advanced Practitioner (CNM/NP): Evaluation and management procedures were performed by the Advanced Practitioner under my supervision and collaboration.  I have reviewed the Advanced Practitioner's note and chart, and I agree with the management and plan.  Suhaib Guzzo 06/20/2012 9:00 AM   

## 2012-07-05 ENCOUNTER — Encounter: Payer: Self-pay | Admitting: *Deleted

## 2012-07-08 ENCOUNTER — Encounter: Payer: Self-pay | Admitting: Obstetrics & Gynecology

## 2012-07-08 ENCOUNTER — Ambulatory Visit (INDEPENDENT_AMBULATORY_CARE_PROVIDER_SITE_OTHER): Payer: BC Managed Care – PPO | Admitting: Obstetrics & Gynecology

## 2012-07-08 DIAGNOSIS — M5430 Sciatica, unspecified side: Secondary | ICD-10-CM

## 2012-07-08 MED ORDER — PRAMOXINE HCL 1 % RE FOAM: RECTAL | Status: DC | PRN

## 2012-07-08 MED ORDER — IBUPROFEN 600 MG PO TABS: 600.0000 mg | ORAL_TABLET | Freq: Four times a day (QID) | ORAL | Status: DC | PRN

## 2012-07-08 MED ORDER — AMITRIPTYLINE HCL 25 MG PO TABS: 25.0000 mg | ORAL_TABLET | Freq: Every day | ORAL | Status: DC

## 2012-07-08 NOTE — Progress Notes (Signed)
SW contacted by clinic staff for PPD screen score of 13.  SW met with patient in consult room to discuss.  Patient was asleep with her head on the table when SW arrived.  We discussed how she is feeling emotionally and she denies symptoms of depression and states we, "caught her on a bad day."  She states she had PPD with her first child due to different circumstances and lack of support at that time.  She reports baby's FOB is involved and supportive and she is just exhausted.  She states no questions or needs from SW.

## 2012-07-08 NOTE — Patient Instructions (Signed)
Sciatica Sciatica is pain, weakness, numbness, or tingling along the path of the sciatic nerve. The nerve starts in the lower back and runs down the back of each leg. The nerve controls the muscles in the lower leg and in the back of the knee, while also providing sensation to the back of the thigh, lower leg, and the sole of your foot. Sciatica is a symptom of another medical condition. For instance, nerve damage or certain conditions, such as a herniated disk or bone spur on the spine, pinch or put pressure on the sciatic nerve. This causes the pain, weakness, or other sensations normally associated with sciatica. Generally, sciatica only affects one side of the body. CAUSES   Herniated or slipped disc.  Degenerative disk disease.  A pain disorder involving the narrow muscle in the buttocks (piriformis syndrome).  Pelvic injury or fracture.  Pregnancy.  Tumor (rare). SYMPTOMS  Symptoms can vary from mild to very severe. The symptoms usually travel from the low back to the buttocks and down the back of the leg. Symptoms can include:  Mild tingling or dull aches in the lower back, leg, or hip.  Numbness in the back of the calf or sole of the foot.  Burning sensations in the lower back, leg, or hip.  Sharp pains in the lower back, leg, or hip.  Leg weakness.  Severe back pain inhibiting movement. These symptoms may get worse with coughing, sneezing, laughing, or prolonged sitting or standing. Also, being overweight may worsen symptoms. DIAGNOSIS  Your caregiver will perform a physical exam to look for common symptoms of sciatica. He or she may ask you to do certain movements or activities that would trigger sciatic nerve pain. Other tests may be performed to find the cause of the sciatica. These may include:  Blood tests.  X-rays.  Imaging tests, such as an MRI or CT scan. TREATMENT  Treatment is directed at the cause of the sciatic pain. Sometimes, treatment is not necessary  and the pain and discomfort goes away on its own. If treatment is needed, your caregiver may suggest:  Over-the-counter medicines to relieve pain.  Prescription medicines, such as anti-inflammatory medicine, muscle relaxants, or narcotics.  Applying heat or ice to the painful area.  Steroid injections to lessen pain, irritation, and inflammation around the nerve.  Reducing activity during periods of pain.  Exercising and stretching to strengthen your abdomen and improve flexibility of your spine. Your caregiver may suggest losing weight if the extra weight makes the back pain worse.  Physical therapy.  Surgery to eliminate what is pressing or pinching the nerve, such as a bone spur or part of a herniated disk. HOME CARE INSTRUCTIONS   Only take over-the-counter or prescription medicines for pain or discomfort as directed by your caregiver.  Apply ice to the affected area for 20 minutes, 3 4 times a day for the first 48 72 hours. Then try heat in the same way.  Exercise, stretch, or perform your usual activities if these do not aggravate your pain.  Attend physical therapy sessions as directed by your caregiver.  Keep all follow-up appointments as directed by your caregiver.  Do not wear high heels or shoes that do not provide proper support.  Check your mattress to see if it is too soft. A firm mattress may lessen your pain and discomfort. SEEK IMMEDIATE MEDICAL CARE IF:   You lose control of your bowel or bladder (incontinence).  You have increasing weakness in the lower back,   pelvis, buttocks, or legs.  You have redness or swelling of your back.  You have a burning sensation when you urinate.  You have pain that gets worse when you lie down or awakens you at night.  Your pain is worse than you have experienced in the past.  Your pain is lasting longer than 4 weeks.  You are suddenly losing weight without reason. MAKE SURE YOU:  Understand these  instructions.  Will watch your condition.  Will get help right away if you are not doing well or get worse. Document Released: 09/08/2001 Document Revised: 03/15/2012 Document Reviewed: 01/24/2012 [facility] Patient Information 2013 [place], [place]. Levonorgestrel intrauterine device (IUD) What is this medicine? LEVONORGESTREL IUD (LEE voe nor jes trel) is a contraceptive (birth control) device. It is used to prevent pregnancy and to treat heavy bleeding that occurs during your period. It can be used for up to 5 years. This medicine may be used for other purposes; ask your health care provider or pharmacist if you have questions. What should I tell my health care provider before I take this medicine? They need to know if you have any of these conditions: -abnormal Pap smear -cancer of the breast, uterus, or cervix -diabetes -endometritis -genital or pelvic infection now or in the past -have more than one sexual partner or your partner has more than one partner -heart disease -history of an ectopic or tubal pregnancy -immune system problems -IUD in place -liver disease or tumor -problems with blood clots or take blood-thinners -use intravenous drugs -uterus of unusual shape -vaginal bleeding that has not been explained -an unusual or allergic reaction to levonorgestrel, other hormones, silicone, or polyethylene, medicines, foods, dyes, or preservatives -pregnant or trying to get pregnant -breast-feeding How should I use this medicine? This device is placed inside the uterus by a health care professional. Talk to your pediatrician regarding the use of this medicine in children. Special care may be needed. Overdosage: If you think you have taken too much of this medicine contact a poison control center or emergency room at once. NOTE: This medicine is only for you. Do not share this medicine with others. What if I miss a dose? This does not apply. What may interact with this  medicine? Do not take this medicine with any of the following medications: -amprenavir -bosentan -fosamprenavir This medicine may also interact with the following medications: -aprepitant -barbiturate medicines for inducing sleep or treating seizures -bexarotene -griseofulvin -medicines to treat seizures like carbamazepine, ethotoin, felbamate, oxcarbazepine, phenytoin, topiramate -modafinil -pioglitazone -rifabutin -rifampin -rifapentine -some medicines to treat HIV infection like atazanavir, indinavir, lopinavir, nelfinavir, tipranavir, ritonavir -St. John's wort -warfarin This list may not describe all possible interactions. Give your health care provider a list of all the medicines, herbs, non-prescription drugs, or dietary supplements you use. Also tell them if you smoke, drink alcohol, or use illegal drugs. Some items may interact with your medicine. What should I watch for while using this medicine? Visit your doctor or health care professional for regular check ups. See your doctor if you or your partner has sexual contact with others, becomes HIV positive, or gets a sexual transmitted disease. This product does not protect you against HIV infection (AIDS) or other sexually transmitted diseases. You can check the placement of the IUD yourself by reaching up to the top of your vagina with clean fingers to feel the threads. Do not pull on the threads. It is a good habit to check placement after each menstrual period. Call  your doctor right away if you feel more of the IUD than just the threads or if you cannot feel the threads at all. The IUD may come out by itself. You may become pregnant if the device comes out. If you notice that the IUD has come out use a backup birth control method like condoms and call your health care provider. Using tampons will not change the position of the IUD and are okay to use during your period. What side effects may I notice from receiving this  medicine? Side effects that you should report to your doctor or health care professional as soon as possible: -allergic reactions like skin rash, itching or hives, swelling of the face, lips, or tongue -fever, flu-like symptoms -genital sores -high blood pressure -no menstrual period for 6 weeks during use -pain, swelling, warmth in the leg -pelvic pain or tenderness -severe or sudden headache -signs of pregnancy -stomach cramping -sudden shortness of breath -trouble with balance, talking, or walking -unusual vaginal bleeding, discharge -yellowing of the eyes or skin Side effects that usually do not require medical attention (report to your doctor or health care professional if they continue or are bothersome): -acne -breast pain -change in sex drive or performance -changes in weight -cramping, dizziness, or faintness while the device is being inserted -headache -irregular menstrual bleeding within first 3 to 6 months of use -nausea This list may not describe all possible side effects. Call your doctor for medical advice about side effects. You may report side effects to FDA at 1-800-FDA-1088. Where should I keep my medicine? This does not apply. NOTE: This sheet is a summary. It may not cover all possible information. If you have questions about this medicine, talk to your doctor, pharmacist, or health care provider.  2012, Elsevier/Gold Standard. (10/05/2008 6:39:08 PM)

## 2012-07-08 NOTE — Progress Notes (Signed)
Referred back to [facility] on [address] per patient preference because she has been there before. Made appointment for 07/09/12 at 0800

## 2012-07-08 NOTE — Progress Notes (Signed)
Patient ID: Laura Guerra, female   DOB: 07-May-1986, 26 y.o.   MRN: [id number] Subjective:     Laura Guerra is a 26 y.o. female who presents for a postpartum visit. She is 4 weeks postpartum following a spontaneous vaginal delivery. I have fully reviewed the prenatal and intrapartum course. The delivery was at 37.3 gestational weeks. Outcome: spontaneous vaginal delivery. Anesthesia: epidural. Postpartum course has been noted for back pain, left leg pain. Baby's course has been normal. Baby is feeding by breast. Bleeding no bleeding. Bowel function is noted for hemorrhoid. Bladder function is normal. Patient is not sexually active. Contraception method is IUD and wants to schedule insertion. Postpartum depression screening: positive.  The following portions of the patient's history were reviewed and updated as appropriate: allergies, current medications, past family history, past medical history, past social history, past surgical history and problem list.  Review of Systems Pertinent items are noted in HPI.   Objective:    BP 121/74  Pulse 62  Temp 97.2 F (36.2 C)  Ht 5\' 5"  (1.651 m)  Wt 176 lb 3.2 oz (79.924 kg)  BMI 29.32 kg/m2  Breastfeeding? No  General:  alert, cooperative and no distress   Breasts:    Lungs:   Heart:  regular rate and rhythm  Abdomen:    Vulva:  not evaluated  Vagina: not evaluated  Cervix:    Corpus: not examined  Adnexa:  not evaluated  Rectal Exam: Not performed.        Assessment:    4 week postpartum exam. Pap smear not done at today's visit.   Plan:    1. Contraception: IUD 2. Sciatic pain 3. Follow up in: 3 days or as needed. Chiropractic referral. Ibuprofen, Elavil 25mg  hs, Proctofoam Rx.  Laura Guerra 07/08/2012 11:03 AM

## 2012-07-08 NOTE — Progress Notes (Signed)
Here for postpartum baby. C/o lower left  back pain that goes down left leg, started right after delivery. States went back to work 07/05/12 at [employer] and pain worse. States worse when sitting.

## 2012-07-11 ENCOUNTER — Telehealth: Payer: Self-pay | Admitting: *Deleted

## 2012-07-11 NOTE — Telephone Encounter (Signed)
Patient left a message requesting a note to go back to work. She was sent to a chiropractor by [place] last week and had an adjustment and was told she was ok to go back to work with a 50lb weight restriction. She needs a note from [place] stating that she may return to work with a 50lb weight restriction faxed to [phone] [facility]) please put patients name and dob on cover sheet.

## 2012-07-12 NOTE — Telephone Encounter (Signed)
Patient Laura Guerra asking [place] to call her back.

## 2012-07-12 NOTE — Telephone Encounter (Signed)
Patient called again and left message that she needed the return to work note and wanted to talk to someone before the note was sent.

## 2012-07-12 NOTE — Telephone Encounter (Signed)
LM to return call to clinic.

## 2012-07-13 NOTE — Telephone Encounter (Signed)
Dr. Debroah Loop,  In addition to previous note... Just to clarify, according to patient, chiropractor states she can go back to work with 50lb restriction. She feels bad and does not want to aggravate her back issues and so explains her request for time-off from work until October 30th as stated from previous note.  Patient request call back for decision.

## 2012-07-13 NOTE — Telephone Encounter (Signed)
Dr. Debroah Loop,  This patient is requesting a letter made for Monia Pouch (fax: [phone]) stating that you have referred her to the Chiropractor and that she needs to be off work until October 30th which is around the next time when you will see her for a follow-up. Chiropractor recommended 50 lb restriction. Please advice of approval or denial and send message back to clinical pool. Thank you.

## 2012-07-14 NOTE — Telephone Encounter (Signed)
Pt came to the office and requested a letter to return back to work.  Pt initially stated that she wanted to be out to Oct 30, but I informed her that only the provider who saw her for her pp visit would be able to make that decision and that the provider was not here today.  Pt then stated that she would take the letter to return to work so that she could a letter today.  Spoke with [employer] and she stated that it was ok for the pt to return to work with a 50lb weight restriction as her chiropractor had stated it was ok for to.  Letter wrote and given to pt.  Pt stated "thank you".

## 2012-07-18 ENCOUNTER — Encounter: Payer: Self-pay | Admitting: Medical

## 2012-07-27 ENCOUNTER — Encounter: Payer: Self-pay | Admitting: Advanced Practice Midwife

## 2012-07-27 ENCOUNTER — Ambulatory Visit (INDEPENDENT_AMBULATORY_CARE_PROVIDER_SITE_OTHER): Payer: BC Managed Care – PPO | Admitting: Advanced Practice Midwife

## 2012-07-27 VITALS — BP 110/72 | HR 84 | Temp 98.1°F | Resp 20

## 2012-07-27 DIAGNOSIS — Z01812 Encounter for preprocedural laboratory examination: Secondary | ICD-10-CM

## 2012-07-27 DIAGNOSIS — Z3049 Encounter for surveillance of other contraceptives: Secondary | ICD-10-CM

## 2012-07-27 DIAGNOSIS — Z30430 Encounter for insertion of intrauterine contraceptive device: Secondary | ICD-10-CM

## 2012-07-27 LAB — POCT PREGNANCY, URINE: Preg Test, Ur: NEGATIVE

## 2012-07-27 MED ORDER — LEVONORGESTREL 20 MCG/24HR IU IUD
INTRAUTERINE_SYSTEM | Freq: Once | INTRAUTERINE | Status: AC
  Administered 2012-07-27: 1 via INTRAUTERINE

## 2012-07-27 NOTE — Patient Instructions (Signed)

## 2012-07-27 NOTE — Progress Notes (Signed)
  Subjective:    Patient ID: Laura Guerra, female    DOB: 1985/11/29, 26 y.o.   MRN: [id number]  HPI This is a 26 y.o. female who presents for insertion of Mirena IUD.  States has had one before and it worked well for her. Just recently had a baby and healed well from that.  SVD on 06/09/12.   Was breastfeeding but stopped and wants to restart.   Past Surgical History  Procedure Date  . No past surgeries   . Wisdom tooth extraction       Review of Systems Denies pain or bleeding.     Objective:   Physical Exam  Constitutional: She is oriented to person, place, and time. She appears well-developed and well-nourished. No distress.  Cardiovascular: Normal rate.   Pulmonary/Chest: Effort normal.  Abdominal: Soft.  Genitourinary: Vagina normal and uterus normal. No vaginal discharge found.       Uterus soft, nontender Sounds to 5cm Cervix normal  Musculoskeletal: Normal range of motion.  Neurological: She is alert and oriented to person, place, and time.  Skin: Skin is warm and dry.  Psychiatric: She has a normal mood and affect.      Assessment & Plan:  IUD Insertion Procedure Note  Pre-operative Diagnosis: Desires IUD contraception   Post-operative Diagnosis: same  Indications: contraception  Procedure Details  Urine pregnancy test was done  and result was negative.  The risks (including infection, bleeding, pain, and uterine perforation) and benefits of the procedure were explained to the patient and Written informed consent was obtained.    Time out procedure was done  Cervix cleansed with Betadine. Uterus sounded to 5 cm. IUD inserted without difficulty. String visible and trimmed. Patient tolerated procedure well.  IUD Information: Mirena.  Condition: Stable  Complications: None  Plan:  The patient was advised to call for any fever or for prolonged or severe pain or bleeding. She was advised to use NSAID as needed for mild to moderate pain.    Attending Physician Documentation: I was present for or participated in the entire procedure, including opening and closing.  Pamphlet given with information on Mirena Followup as needed.

## 2012-08-08 ENCOUNTER — Telehealth: Payer: Self-pay | Admitting: Obstetrics and Gynecology

## 2012-08-08 NOTE — Telephone Encounter (Signed)
Patient called with c/o vaginal bleed post IUD insertion on 07/27/12. Stated that she's only changing her pad about once or twice a day and they are not fully saturated. Advised patient that she is expected to get a breakthrough bleed with mirena and should subside as her body have adjusted to the device. She is to go to MAU if she's having to change her pad >3X per hour and if she start to have discharge with odor with fever. Patient agrees and satisfied.

## 2013-01-09 ENCOUNTER — Encounter: Payer: Self-pay | Admitting: Obstetrics and Gynecology

## 2013-01-09 ENCOUNTER — Ambulatory Visit (INDEPENDENT_AMBULATORY_CARE_PROVIDER_SITE_OTHER): Payer: BC Managed Care – PPO | Admitting: Obstetrics and Gynecology

## 2013-01-09 VITALS — BP 116/72 | HR 76 | Ht 65.0 in | Wt 167.1 lb

## 2013-01-09 DIAGNOSIS — N760 Acute vaginitis: Secondary | ICD-10-CM

## 2013-01-09 LAB — WET PREP, GENITAL
Trich, Wet Prep: NONE SEEN
Yeast Wet Prep HPF POC: NONE SEEN

## 2013-01-09 NOTE — Addendum Note (Signed)
Addended by: Toula Moos on: 01/09/2013 04:13 PM   Modules accepted: Orders

## 2013-01-09 NOTE — Progress Notes (Signed)
Patient ID: Laura Guerra, female   DOB: Guerra 13, 1987, 27 y.o.   MRN: [id number] 27 yo G2P2 with LMP 12/19/2012 presenting today for evaluation of abnormal discharge  GENERAL: Well-developed, well-nourished female in no acute distress.  ABDOMEN: Soft, nontender, nondistended. No organomegaly. PELVIC: Normal external female genitalia. Vagina is pink and rugated.  Normal discharge. Normal appearing cervix. Uterus is normal in size. No adnexal mass or tenderness. EXTREMITIES: No cyanosis, clubbing, or edema, 2+ distal pulses.   A/P 27 yo with vaginitis - wet prep collected - patient will be contacted with any abnormal results - RTC in May for annual exam

## 2013-01-09 NOTE — Addendum Note (Signed)
Addended by: Toula Moos on: 01/09/2013 04:19 PM   Modules accepted: Orders

## 2013-01-09 NOTE — Patient Instructions (Signed)
Vaginitis  Vaginitis is an infection. It causes soreness, swelling, and redness (inflammation) of the vagina. Many of these infections are sexually transmitted diseases (STDs). Having unprotected sex can cause further problems and complications such as:   Chronic pelvic pain.   Infertility.   Unwanted pregnancy.   Abortion.   Tubal pregnancy.   Infection passed on to the newborn.   Cancer.  CAUSES    Monilia. This is a yeast or fungus infection, not an STD.   Bacterial vaginosis. The normal balance of bacteria in the vagina is disrupted and is replaced by an overgrowth of certain bacteria.   Gonorrhea, chlamydia. These are bacterial infections that are STDs.   Vaginal sponges, diaphragms, and intrauterine devices.   Trichomoniasis. This is a STD infection caused by a parasite.   Viruses like herpes and human papillomavirus. Both are STDs.   Pregnancy.   Immunosuppression. This occurs with certain conditions such as HIV infection or cancer.   Using bubble bath.   Taking certain antibiotic medicines.   Sporadic recurrence can occur if you become sick.   Diabetes.   Steroids.   Allergic reaction. If you have an allergy to:   Douches.   Soaps.   Spermicides.   Condoms.   Scented tampons or vaginal sprays.  SYMPTOMS    Abnormal vaginal discharge.   Itching of the vagina.   Pain in the vagina.   Swelling of the vagina.  In some cases, there are no symptoms.  TREATMENT   Treatment will vary depending on the type of infection.   Bacteria or trichomonas are usually treated with oral antibiotics and sometimes vaginal cream or suppositories.   Monilia vaginitis is usually treated with vaginal creams, suppositories, or oral antifungal pills.   Viral vaginitis has no cure. However, the symptoms of herpes (a viral vaginitis) can be treated to relieve the discomfort. Human papillomavirus has no symptoms. However, there are treatments for the diseases caused by human papillomavirus.   With allergic  vaginitis, you need to stop using the product that is causing the problem. Vaginal creams can be used to treat the symptoms.   When treating an STD, the sex partner should also be treated.  HOME CARE INSTRUCTIONS    Take all the medicines as directed by your caregiver.   Do not use scented tampons, soaps, or vaginal sprays.   Do not douche.   Tell your sex partner if you have a vaginal infection or an STD.   Do not have sexual intercourse until you have treated the vaginitis.   Practice safe sex by using condoms.  SEEK MEDICAL CARE IF:    You have abdominal pain.   Your symptoms get worse during treatment.  Document Released: 07/12/2007 Document Revised: 12/07/2011 Document Reviewed: 03/07/2009  ExitCare Patient Information 2013 [employer].

## 2013-01-09 NOTE — Addendum Note (Signed)
Addended by: Franchot Mimes on: 01/09/2013 04:14 PM   Modules accepted: Orders

## 2013-01-10 ENCOUNTER — Telehealth: Payer: Self-pay | Admitting: *Deleted

## 2013-01-10 MED ORDER — METRONIDAZOLE 500 MG PO TABS: 500.0000 mg | ORAL_TABLET | Freq: Two times a day (BID) | ORAL | Status: DC

## 2013-01-10 NOTE — Addendum Note (Signed)
Addended by: Catalina Antigua on: 01/10/2013 08:37 AM   Modules accepted: Orders

## 2013-01-10 NOTE — Telephone Encounter (Signed)
Message copied by Mannie Stabile on Tue Jan 10, 2013 10:08 AM ------      Message from: CONSTANT, Gigi Gin      Created: Tue Jan 10, 2013  8:37 AM       Please inform patient of positive BV- Rx e-prescribed            Peggy ------

## 2013-01-10 NOTE — Telephone Encounter (Signed)
Called [place] and left her a voicemail that we are calling to give her some results.

## 2013-01-10 NOTE — Telephone Encounter (Signed)
Pt returned call and I informed her of test result indicating +BV. Rx for flagyl sent to her pharmacy. Pt asked if this is a sexually transmitted infection and if it can be passed to her partner. I explained that this cannot be passed to a partner and is strictly an imbalance in the normal bacteria of her vagina. Pt voiced understanding.

## 2013-04-06 ENCOUNTER — Inpatient Hospital Stay (HOSPITAL_COMMUNITY)
Admission: AD | Admit: 2013-04-06 | Discharge: 2013-04-06 | Disposition: A | Discharge status: Home / Self Care | Payer: BC Managed Care – PPO | Source: Ambulatory Visit | Attending: Obstetrics & Gynecology | Admitting: Obstetrics & Gynecology

## 2013-04-06 ENCOUNTER — Encounter (HOSPITAL_COMMUNITY): Payer: Self-pay | Admitting: *Deleted

## 2013-04-06 DIAGNOSIS — B3731 Acute candidiasis of vulva and vagina: Secondary | ICD-10-CM | POA: Insufficient documentation

## 2013-04-06 DIAGNOSIS — N949 Unspecified condition associated with female genital organs and menstrual cycle: Secondary | ICD-10-CM | POA: Insufficient documentation

## 2013-04-06 DIAGNOSIS — B373 Candidiasis of vulva and vagina: Secondary | ICD-10-CM | POA: Insufficient documentation

## 2013-04-06 DIAGNOSIS — K649 Unspecified hemorrhoids: Secondary | ICD-10-CM | POA: Insufficient documentation

## 2013-04-06 LAB — WET PREP, GENITAL
Clue Cells Wet Prep HPF POC: NONE SEEN
Yeast Wet Prep HPF POC: NONE SEEN

## 2013-04-06 MED ORDER — METRONIDAZOLE 0.75 % VA GEL: 1.0000 | Freq: Two times a day (BID) | VAGINAL | Status: DC

## 2013-04-06 MED ORDER — HYDROCORTISONE ACE-PRAMOXINE 1-1 % RE FOAM: 1.0000 | Freq: Two times a day (BID) | RECTAL | Status: DC

## 2013-04-06 NOTE — MAU Provider Note (Signed)
History     CSN: [id number]  Arrival date and time: 04/06/13 0246   None    Chief Complaint  Patient presents with  . Hemorrhoids   HPI - 27 y.o. [id number] here with vaginal discomfort. Per patient, Monday had hemorrhoids acting up and saw [facility] where she was diagnosed with BV, however she forgot to take metronidazole prescribed and was also nauseated when she took one dose of it. Vaginal symptoms include burning, itching, and cottage cheesy discharge. She is not having sex currently due to symptoms. She also has some discomfort when she pees but denies abdominal pain, fevers, chills, vomiting, or current nausea. Had some spotting today (period ended a few days ago), one episode of diarrhea yesterday, and mild nausea yesterday, none currently.  Has had hemorrhoids and BV in the past that felt like this.  H/o gonorrhea in the distant past, treated.  Gynecologic history: LMP ended Sunday, was longer than normal (7 days). Never been regular. [id number], no complications, both NSVD.  Past Medical History  Diagnosis Date  . No pertinent past medical history   . Seasonal depression     Past Surgical History  Procedure Laterality Date  . No past surgeries    . Wisdom tooth extraction      Family History  Problem Relation Age of Onset  . Cancer Father   . Cancer Maternal Grandmother     History  Substance Use Topics  . Smoking status: Current Some Day Smoker -- 0.25 packs/day for 17 years    Types: Cigarettes    Last Attempt to Quit: 01/22/2012  . Smokeless tobacco: Never Used  . Alcohol Use: No  1 pack per day No alcohol or drugs  Allergies: No Known Allergies  Prescriptions prior to admission  Medication Sig Dispense Refill  . levonorgestrel (MIRENA) 20 MCG/24HR IUD 1 each by Intrauterine route once.        ROS - Per HPI  Physical Exam   Blood pressure 123/64, pulse 82, temperature 97.7 F (36.5 C), temperature source Oral, resp. rate 18, height 5\' 5"  (1.651  m), weight 70.308 kg (155 lb), last menstrual period 03/28/2013, SpO2 100.00%.  Physical Exam  Constitutional: She appears well-developed and well-nourished. No distress.  HENT:  Head: Normocephalic and atraumatic.  Eyes: Conjunctivae and EOM are normal.  Neck: Normal range of motion. Neck supple.  Cardiovascular: Normal rate and regular rhythm.   Respiratory: Effort normal and breath sounds normal. No stridor. No respiratory distress. She has no wheezes. She has no rales.  GI: Soft. Bowel sounds are normal. She exhibits no distension. There is no tenderness. There is no rebound and no guarding.  Genitourinary: Vaginal discharge found.  External vagina appears normal. Speculum exam: Thick mucopurulent discharge, small amount of cottage cheese appearing discharge, no cervical motion tenderness or bleeding  Musculoskeletal: Normal range of motion. She exhibits no edema and no tenderness.  Neurological: She is alert.  Skin: Skin is warm and dry. No rash noted. She is not diaphoretic. No erythema.  Psychiatric: She has a normal mood and affect. Her behavior is normal.  Rectum with external hemorrhoids, one is pink but not dark purple and not firm, no thrombosis suspected  MAU Course  Procedures  MDM Wet prep and gc/chlamydia  Wet prep: Moderate WBC, otherwise negative  Assessment and Plan  27 y.o. [id number] here with vaginal discomfort and rectal discomfort with hemorrhoid. Wet prep negative, but cottage cheese discharge and itchiness make yeast infection likely. Pt does not  feel at risk for STD and no cervical motion tenderness so Gc/Chlamydia unlikely. - Vaginal discomfort - Discharged with recommendation to trial OTC monistat and f/u in clinic if no improvement - Hemorrhoids - Painful but no signs on exam of thrombosis; rectofoam PR prescribed and recommended not straining - GC/Chlamydia pending - Return precautions reviewed.  Simone Curia 04/06/2013, 3:38 AM   Seen also by  me AGree with note Wynelle Bourgeois CNM

## 2013-04-06 NOTE — MAU Note (Signed)
Pt reports she has had hemorrhoids since her delivery 9 months ago and she is having a lot of pain. Also reports vaginal discharge and itching. States she was seen in clinic downstairs about one month ago and diagnosed with bacterial infection and given antibiotics but did not finish them.

## 2013-04-07 ENCOUNTER — Encounter (HOSPITAL_COMMUNITY): Payer: Self-pay | Admitting: *Deleted

## 2013-04-07 ENCOUNTER — Inpatient Hospital Stay (HOSPITAL_COMMUNITY)
Admission: AD | Admit: 2013-04-07 | Discharge: 2013-04-07 | Disposition: A | Discharge status: Home / Self Care | Payer: BC Managed Care – PPO | Source: Ambulatory Visit | Attending: Obstetrics & Gynecology | Admitting: Obstetrics & Gynecology

## 2013-04-07 DIAGNOSIS — K644 Residual hemorrhoidal skin tags: Secondary | ICD-10-CM | POA: Insufficient documentation

## 2013-04-07 LAB — GC/CHLAMYDIA PROBE AMP: CT Probe RNA: NEGATIVE

## 2013-04-07 MED ORDER — PHENYLEPHRINE IN HARD FAT 0.25 % RE SUPP: 1.0000 | Freq: Two times a day (BID) | RECTAL | Status: DC

## 2013-04-07 NOTE — MAU Provider Note (Signed)
  History     CSN: [id number]  Arrival date and time: 04/07/13 0427   None     Chief Complaint  Patient presents with  . Hemorrhoids   HPI  Laura Guerra is a 27 y.o. who presents today with a hemmorhoid. She was seen last night and given proctofoam. She states that it is feeling better. However, she works at [employer] and does a lot of lifting. After working tonight she feels more sore and raw. 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 She denies any increased pain. She denies any bleeding.   Past Medical History  Diagnosis Date  . No pertinent past medical history   . Seasonal depression     Past Surgical History  Procedure Laterality Date  . No past surgeries    . Wisdom tooth extraction      Family History  Problem Relation Age of Onset  . Cancer Father   . Cancer Maternal Grandmother     History  Substance Use Topics  . Smoking status: Current Some Day Smoker -- 0.25 packs/day for 17 years    Types: Cigarettes    Last Attempt to Quit: 01/22/2012  . Smokeless tobacco: Never Used  . Alcohol Use: No    Allergies: No Known Allergies  Prescriptions prior to admission  Medication Sig Dispense Refill  . hydrocortisone-pramoxine (PROCTOFOAM HC) rectal foam Place 1 applicator rectally 2 (two) times daily.  10 g  0  . levonorgestrel (MIRENA) 20 MCG/24HR IUD 1 each by Intrauterine route once.        ROS Physical Exam   Blood pressure 107/70, pulse 68, temperature 97 F (36.1 C), temperature source Oral, resp. rate 18, last menstrual period 03/28/2013, SpO2 99.00%.  Physical Exam  Nursing note and vitals reviewed. Constitutional: She is oriented to person, place, and time. She appears well-developed and well-nourished. No distress.  Neurological: She is alert and oriented to person, place, and time.  Psychiatric: She has a normal mood and affect.   Patient declines to have another exam tonight.  MAU Course  Procedures    Assessment and  Plan   1. External hemorrhoid      Medication List         hydrocortisone-pramoxine rectal foam  Commonly known as:  PROCTOFOAM HC  Place 1 applicator rectally 2 (two) times daily.     levonorgestrel 20 MCG/24HR IUD  Commonly known as:  MIRENA  1 each by Intrauterine route once.     phenylephrine 0.25 % suppository  Commonly known as:  (USE for PREPARATION-H)  Place 1 suppository rectally 2 (two) times daily.       Work note given for Friday. May return to work on Monday.   Tawnya Crook 04/07/2013, 5:06 AM

## 2013-04-07 NOTE — MAU Note (Signed)
Pt seen last pm, given Proctofoam for hemorrhoids. States she works for [employer] and does a lot of heavy lifting and the pain was worse today. 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

## 2013-07-06 ENCOUNTER — Encounter (HOSPITAL_COMMUNITY): Payer: Self-pay | Admitting: Emergency Medicine

## 2013-07-06 ENCOUNTER — Emergency Department (HOSPITAL_COMMUNITY)
Admission: EM | Admit: 2013-07-06 | Discharge: 2013-07-06 | Disposition: A | Discharge status: Home / Self Care | Payer: BC Managed Care – PPO | Attending: Emergency Medicine | Admitting: Emergency Medicine

## 2013-07-06 DIAGNOSIS — IMO0002 Reserved for concepts with insufficient information to code with codable children: Secondary | ICD-10-CM | POA: Insufficient documentation

## 2013-07-06 DIAGNOSIS — Z79899 Other long term (current) drug therapy: Secondary | ICD-10-CM | POA: Insufficient documentation

## 2013-07-06 DIAGNOSIS — M5410 Radiculopathy, site unspecified: Secondary | ICD-10-CM

## 2013-07-06 DIAGNOSIS — F17200 Nicotine dependence, unspecified, uncomplicated: Secondary | ICD-10-CM | POA: Insufficient documentation

## 2013-07-06 DIAGNOSIS — R209 Unspecified disturbances of skin sensation: Secondary | ICD-10-CM | POA: Insufficient documentation

## 2013-07-06 DIAGNOSIS — M542 Cervicalgia: Secondary | ICD-10-CM | POA: Insufficient documentation

## 2013-07-06 DIAGNOSIS — IMO0001 Reserved for inherently not codable concepts without codable children: Secondary | ICD-10-CM | POA: Insufficient documentation

## 2013-07-06 MED ORDER — PREDNISONE 10 MG PO TABS: 20.0000 mg | ORAL_TABLET | Freq: Every day | ORAL | Status: DC

## 2013-07-06 MED ORDER — METHOCARBAMOL 500 MG PO TABS: 500.0000 mg | ORAL_TABLET | Freq: Two times a day (BID) | ORAL | Status: DC

## 2013-07-06 MED ORDER — PREDNISONE 20 MG PO TABS
60.0000 mg | ORAL_TABLET | Freq: Once | ORAL | Status: AC
  Administered 2013-07-06: 60 mg via ORAL
  Filled 2013-07-06: qty 3

## 2013-07-06 MED ORDER — METHOCARBAMOL 500 MG PO TABS
500.0000 mg | ORAL_TABLET | Freq: Once | ORAL | Status: AC
  Administered 2013-07-06: 500 mg via ORAL
  Filled 2013-07-06: qty 1

## 2013-07-06 MED ORDER — OXYCODONE-ACETAMINOPHEN 5-325 MG PO TABS: 2.0000 | ORAL_TABLET | ORAL | Status: DC | PRN

## 2013-07-06 MED ORDER — OXYCODONE-ACETAMINOPHEN 5-325 MG PO TABS
1.0000 | ORAL_TABLET | Freq: Once | ORAL | Status: AC
  Administered 2013-07-06: 1 via ORAL
  Filled 2013-07-06: qty 1

## 2013-07-06 NOTE — Discharge Instructions (Signed)
Radicular Pain Radicular pain in either the arm or leg is usually from a bulging or herniated disk in the spine. A piece of the herniated disk may press against the nerves as the nerves exit the spine. This causes pain which is felt at the tips of the nerves down the arm or leg. Other causes of radicular pain may include:  Fractures.  Heart disease.  Cancer.  An abnormal and usually degenerative state of the nervous system or nerves (neuropathy). Diagnosis may require CT or MRI scanning to determine the primary cause.  Nerves that start at the neck (nerve roots) may cause radicular pain in the outer shoulder and arm. It can spread down to the thumb and fingers. The symptoms vary depending on which nerve root has been affected. In most cases radicular pain improves with conservative treatment. Neck problems may require physical therapy, a neck collar, or cervical traction. Treatment may take many weeks, and surgery may be considered if the symptoms do not improve.  Conservative treatment is also recommended for sciatica. Sciatica causes pain to radiate from the lower back or buttock area down the leg into the foot. Often there is a history of back problems. Most patients with sciatica are better after 2 to 4 weeks of rest and other supportive care. Short term bed rest can reduce the disk pressure considerably. Sitting, however, is not a good position since this increases the pressure on the disk. You should avoid bending, lifting, and all other activities which make the problem worse. Traction can be used in severe cases. Surgery is usually reserved for patients who do not improve within the first months of treatment. Only take over-the-counter or prescription medicines for pain, discomfort, or fever as directed by your caregiver. Narcotics and muscle relaxants may help by relieving more severe pain and spasm and by providing mild sedation. Cold or massage can give significant relief. Spinal manipulation  is not recommended. It can increase the degree of disc protrusion. Epidural steroid injections are often effective treatment for radicular pain. These injections deliver medicine to the spinal nerve in the space between the protective covering of the spinal cord and back bones (vertebrae). Your caregiver can give you more information about steroid injections. These injections are most effective when given within two weeks of the onset of pain.  You should see your caregiver for follow up care as recommended. A program for neck and back injury rehabilitation with stretching and strengthening exercises is an important part of management.  SEEK IMMEDIATE MEDICAL CARE IF:  You develop increased pain, weakness, or numbness in your arm or leg.  You develop difficulty with bladder or bowel control.  You develop abdominal pain. Document Released: 10/22/2004 Document Revised: 12/07/2011 Document Reviewed: 01/07/2009 Exit[employer] Patient Information 2014 [employer].  
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
## 2013-07-06 NOTE — ED Notes (Signed)
Pt from home c/o lower back pain with bilateral numbness to legs since yesterday. Pt ambulatory at triage

## 2013-07-07 NOTE — ED Provider Notes (Signed)
Medical screening examination/treatment/procedure(s) were performed by non-physician practitioner and as supervising physician I was immediately available for consultation/collaboration.   Candyce Churn, MD 07/07/13 1038

## 2013-08-10 IMAGING — US US OB DETAIL+14 WK
1 series · 12 of 28 positions shown · non-contrast
Comparison: none

[Series 1: us ob detail+14 wk · 0.23mm/px · 12 of 115 slices shown]
[im 5/115]
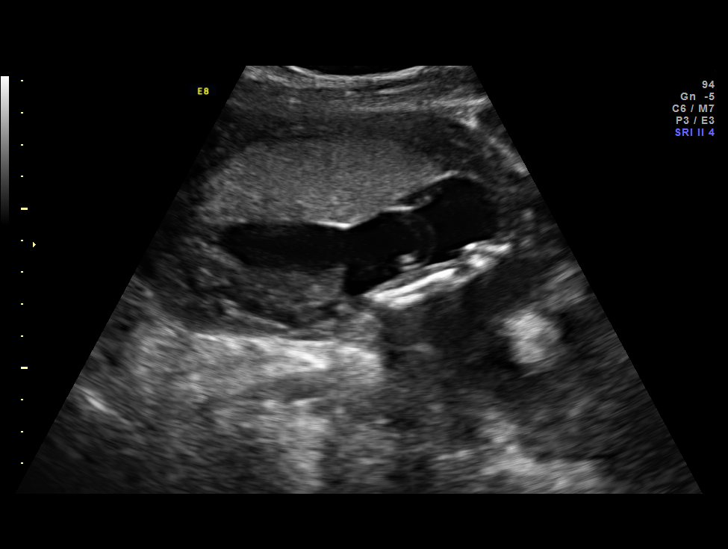
[im 13/115]
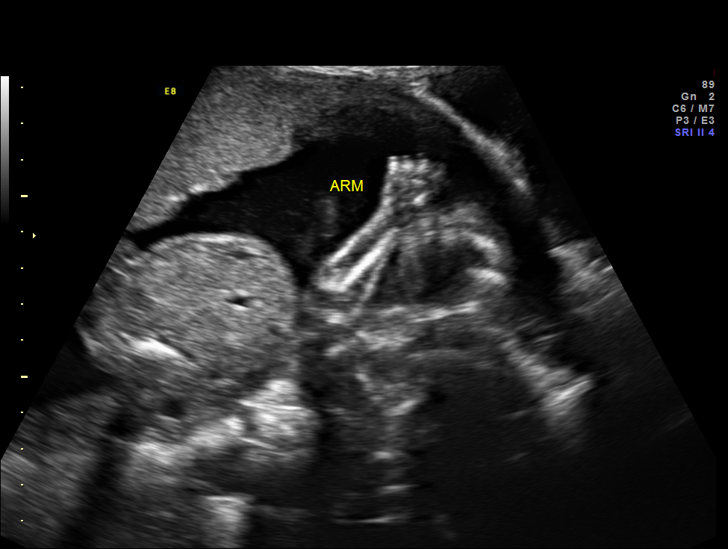
[im 22/115]
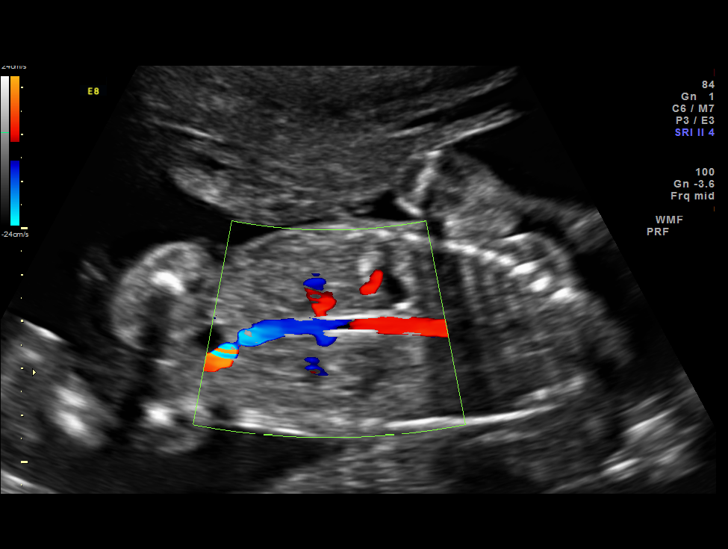
[im 34/115]
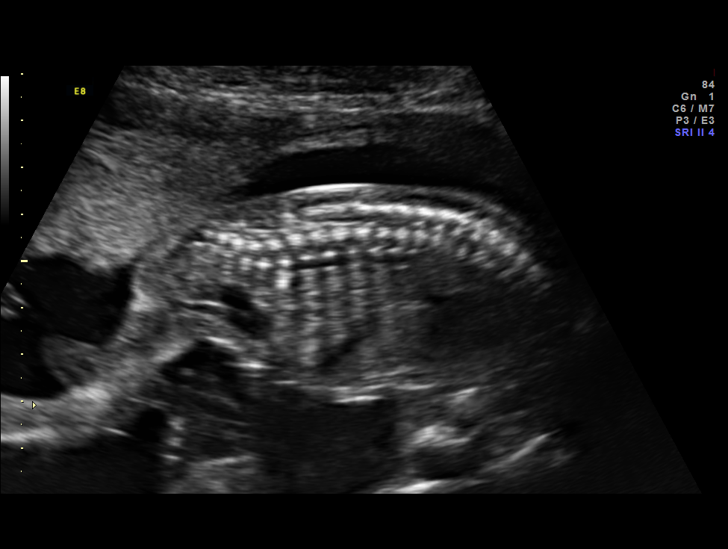
[im 43/115]
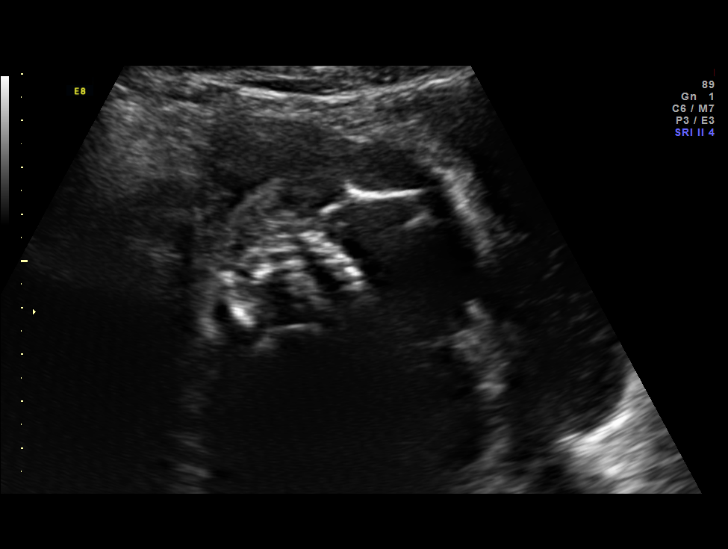
[im 51/115]
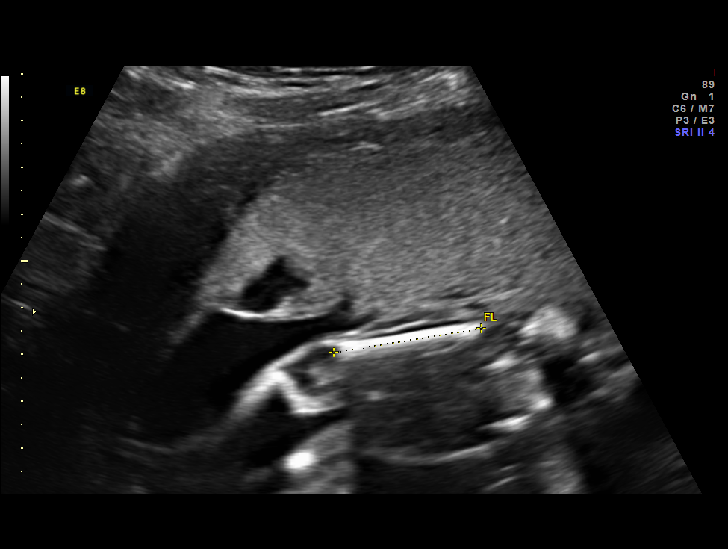
[im 64/115]
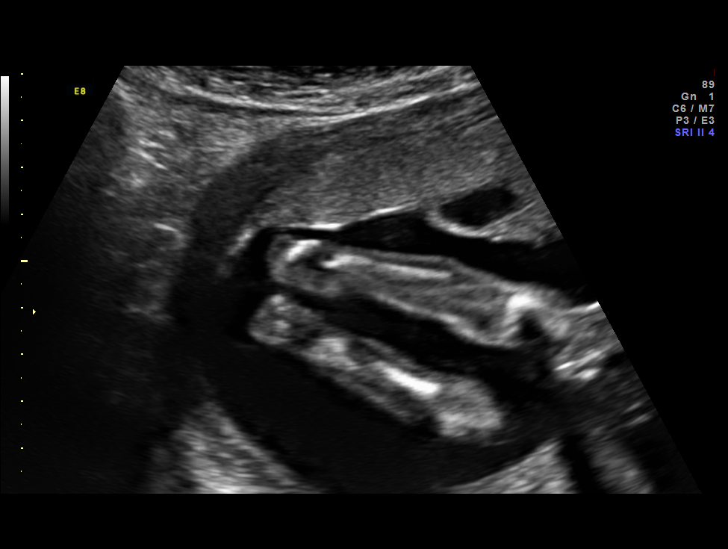
[im 72/115]
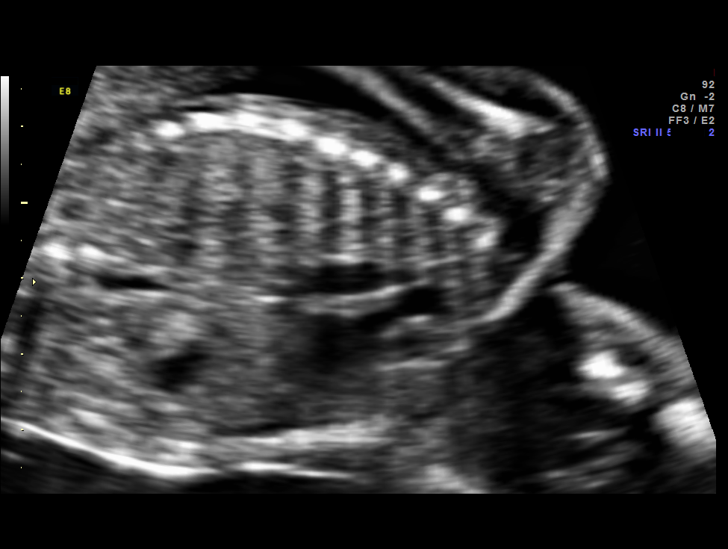
[im 81/115]
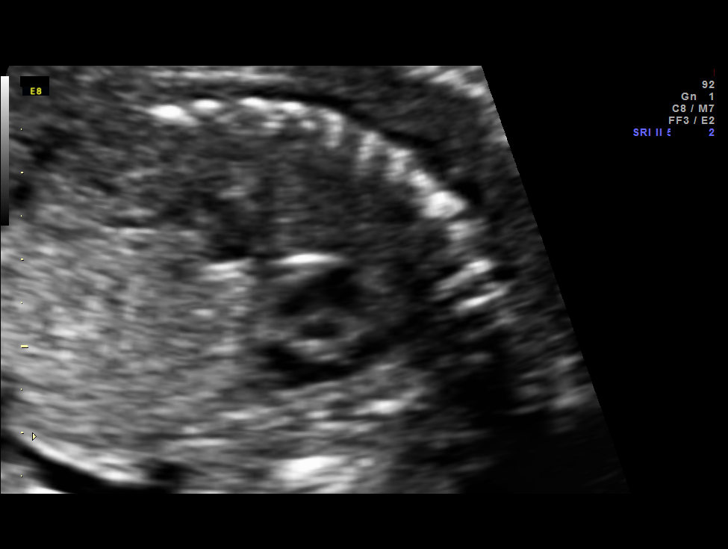
[im 93/115]
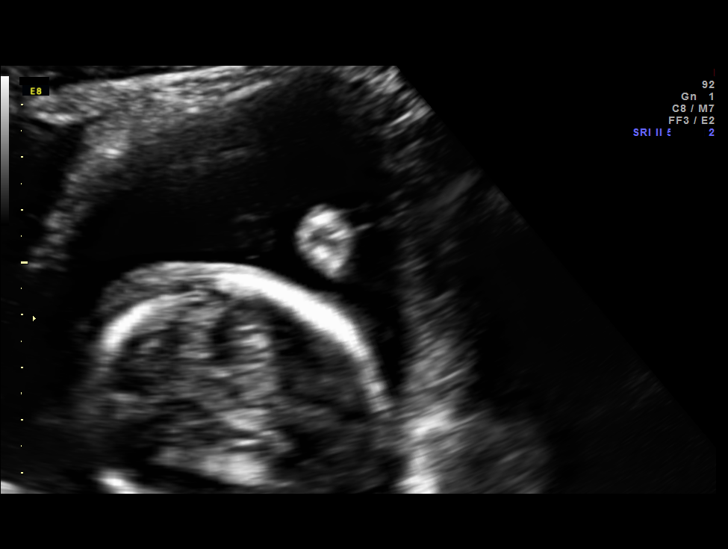
[im 102/115]
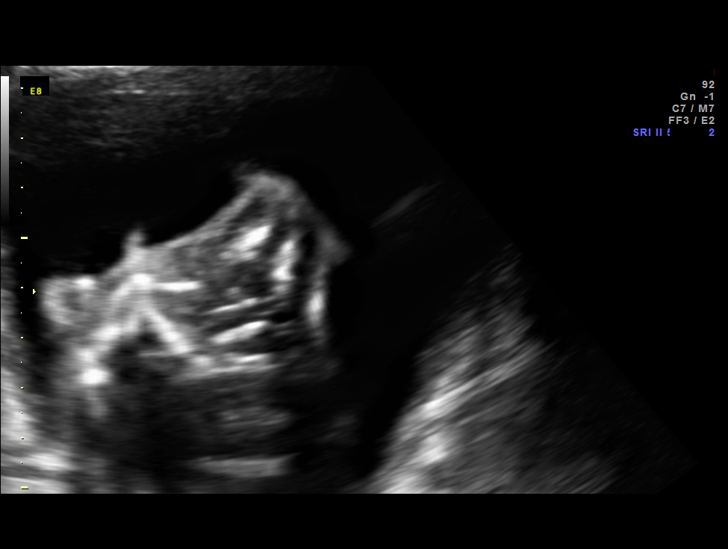
[im 110/115]
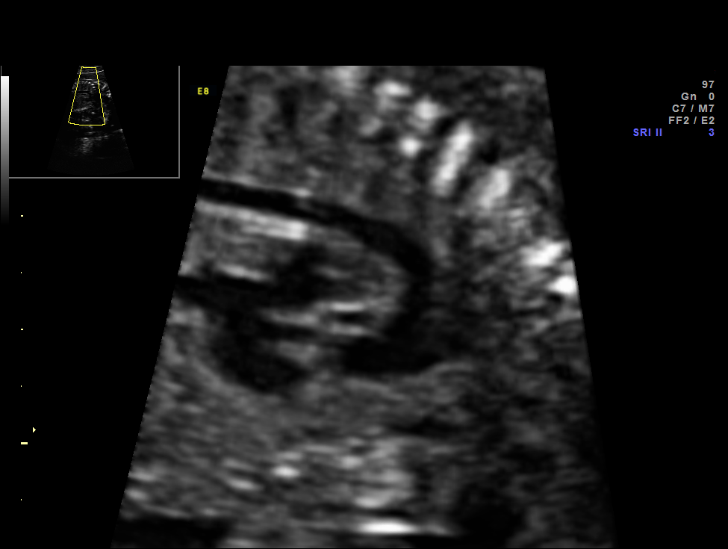

[12 of 28 positions shown; findings below may reference images not displayed]

OBSTETRICS REPORT
                      (Signed Final 02/08/2012 [DATE])

 Order#:         [id number]
Procedures

 US OB DETAIL + 14 WK                                  76811.0
Indications

 Detailed fetal anatomic survey
 Previous pregnancy with unilateral club foot (pt
 father w/ same history)
 Follow up abruption.
Fetal Evaluation

 Fetal Heart Rate:  135                          bpm
 Cardiac Activity:  Observed
 Presentation:      Cephalic
 Placenta:          Anterior, above cervical os
 P. Cord            Visualized
 Insertion:

 Amniotic Fluid
 AFI FV:      Subjectively within normal limits
                                             Larg Pckt:     5.4  cm
Biometry

 BPD:     49.2  mm     G. Age:  20w 6d                CI:         81.1   70 - 86
 OFD:     60.7  mm                                    FL/HC:      18.1   16.8 -

 HC:     176.6  mm     G. Age:  20w 1d       49  %    HC/AC:      1.14   1.09 -

 AC:       155  mm     G. Age:  20w 5d       66  %    FL/BPD:
 FL:      31.9  mm     G. Age:  20w 0d       40  %    FL/AC:      20.6   20 - 24
 HUM:     29.9  mm     G. Age:  19w 6d       49  %
 CER:     20.2  mm     G. Age:  19w 1d       32  %

 Est. FW:     347  gm    0 lb 12 oz      54  %
Gestational Age

 U/S Today:     20w 3d                                        EDD:   06/24/12
 Best:          20w 0d     Det. By:  U/S C R L (11/15/11)     EDD:   06/27/12
Anatomy

 Cranium:           Appears normal      Aortic Arch:       Appears normal
 Fetal Cavum:       Appears normal      Ductal Arch:       Appears normal
 Ventricles:        Appears normal      Diaphragm:         Appears normal
 Choroid Plexus:    Appears normal      Stomach:           Appears normal
 Cerebellum:        Appears normal      Abdomen:           Appears normal
 Posterior Fossa:   Appears normal      Abdominal Wall:    Appears nml
                                                           (cord insert,
                                                           abd wall)
 Nuchal Fold:       Not applicable      Cord Vessels:      Appears normal
                    (>20 wks GA)                           (3 vessel cord)
 Face:              Appears normal      Kidneys:           Appear normal
                    (lip/orbits)
 Heart:             Appears normal      Bladder:           Appears normal
                    (4 chamber &
                    axis)
 RVOT:              Appears normal      Spine:             Appears normal
 LVOT:              Appears normal      Limbs:             Appears normal
                                                           (hands, ankles,
                                                           feet)

 Other:     Female gender. Technically difficult due to fetal position.
Cervix Uterus Adnexa

 Cervical Length:    3.2      cm

 Cervix:       Normal appearance by transabdominal scan.

 Left Ovary:    Within normal limits.
 Right Ovary:   Within normal limits
Impression

 IUP at 20+0 weeks
 Normal detailed fetal anatomy
 Markers of aneuploidy: none
 Normal amniotic fluid volume
 Measurements consistent with prior US
 Possible marginal abruption involving fundal edge; other
 areas appear to be placental lakes; placenta has had an
 atypical appearance since 
 13 weeks

Recommendations

 Follow-up ultrasound in 4 weeks to reassess placenta and
 growth

## 2013-09-22 IMAGING — US US OB FOLLOW-UP
1 series · 12 of 28 positions shown · non-contrast
Comparison: none

[Series 1: us ob follow-up · 0.11mm/px · 55 acquisitions, 12 frames shown]
[im 3/55]
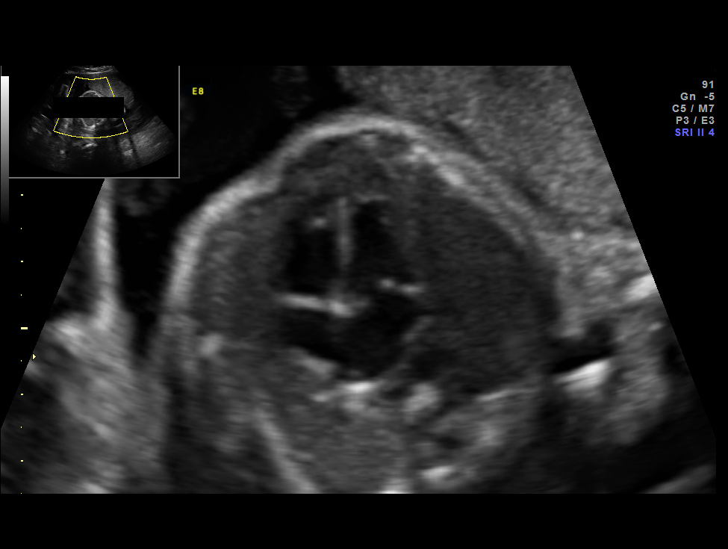
[im 7/55]
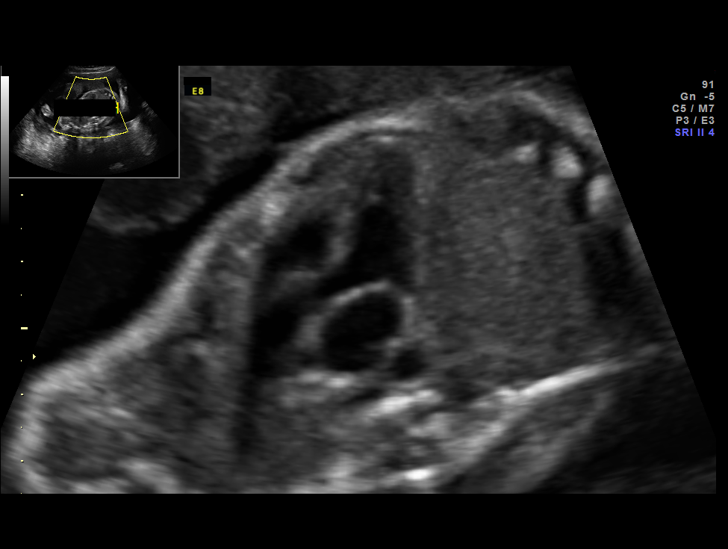
[im 11/55]
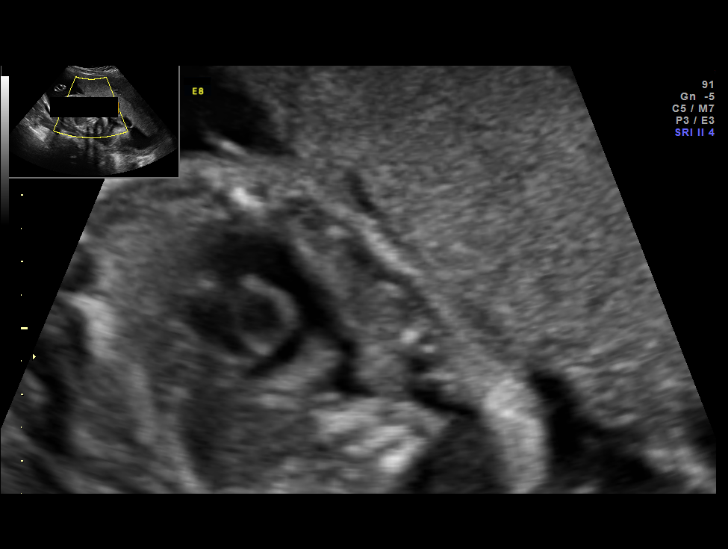
[im 17/55]
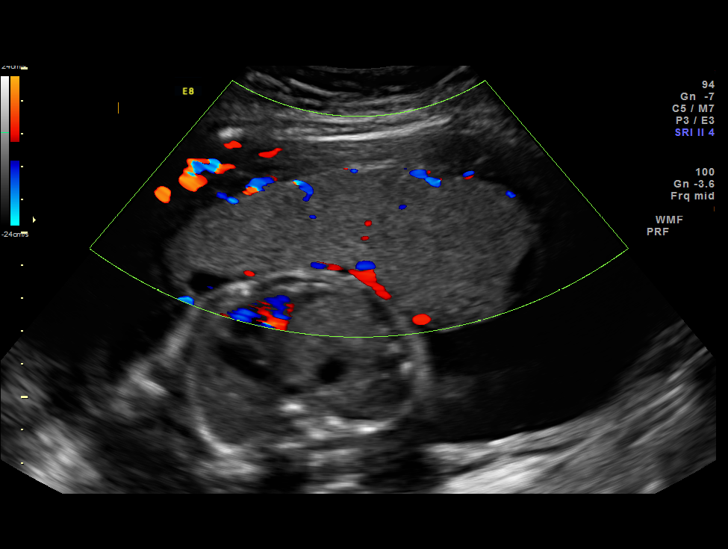
[im 21/55]
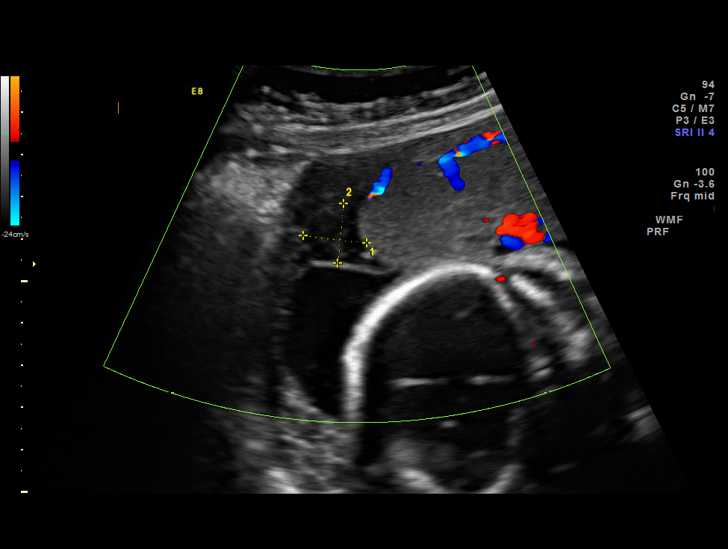
[im 25/55]
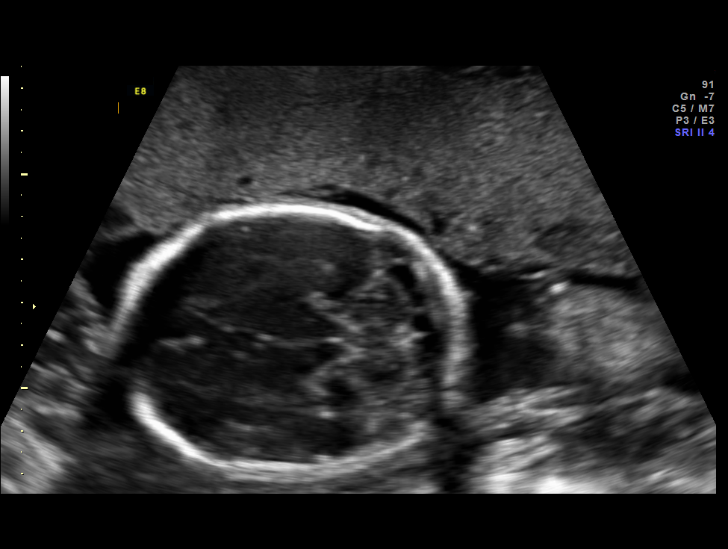
[im 31/55]
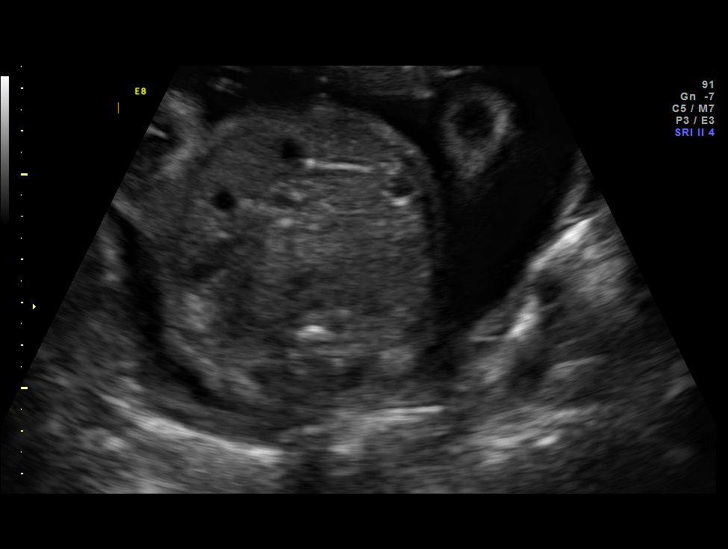
[im 35/55]
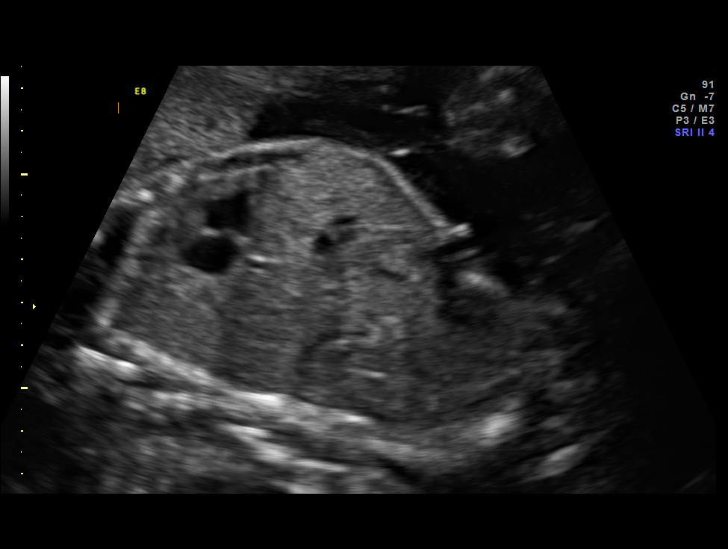
[im 39/55]
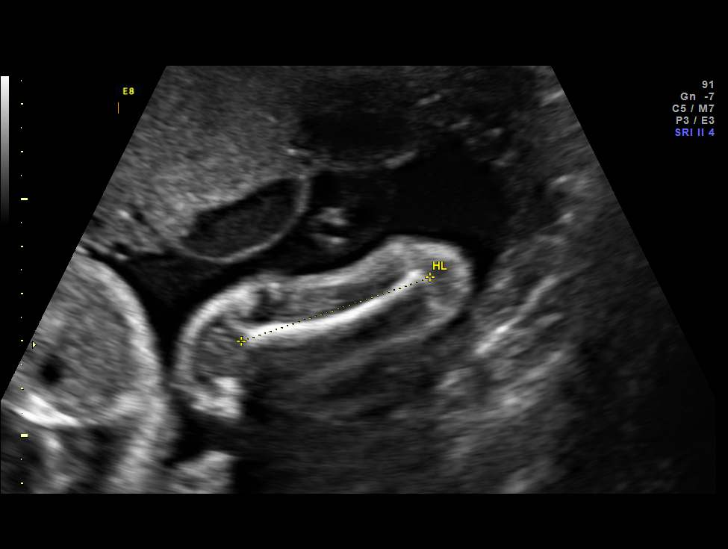
[im 45/55]
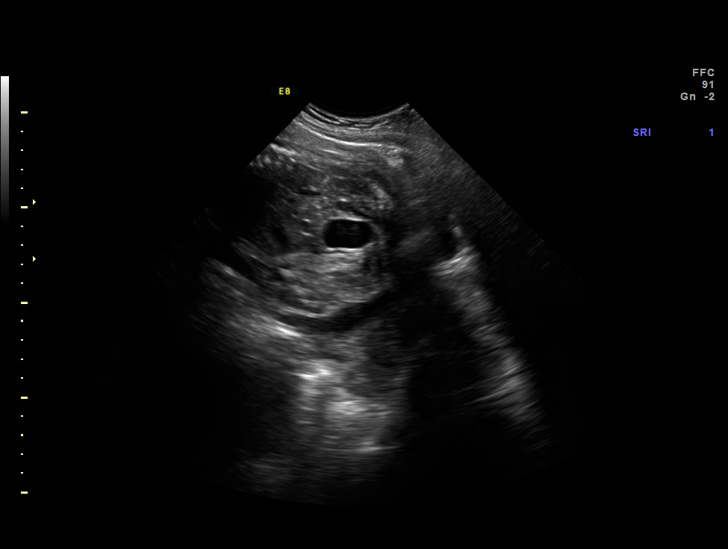
[im 49/55]
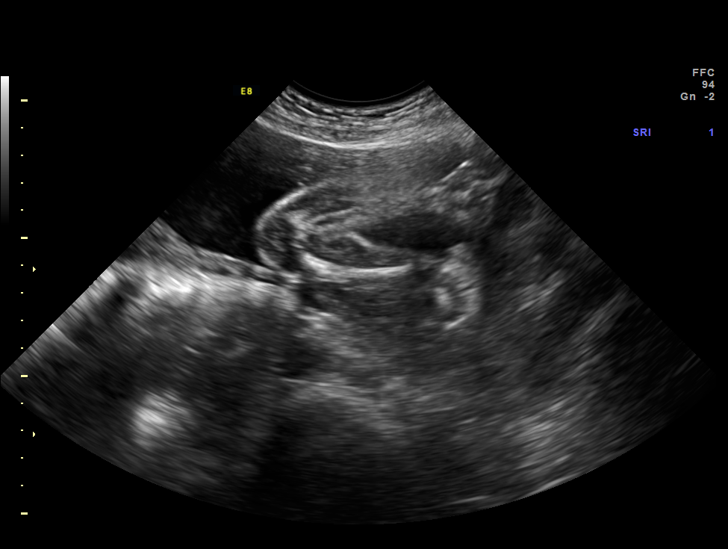
[im 53/55]
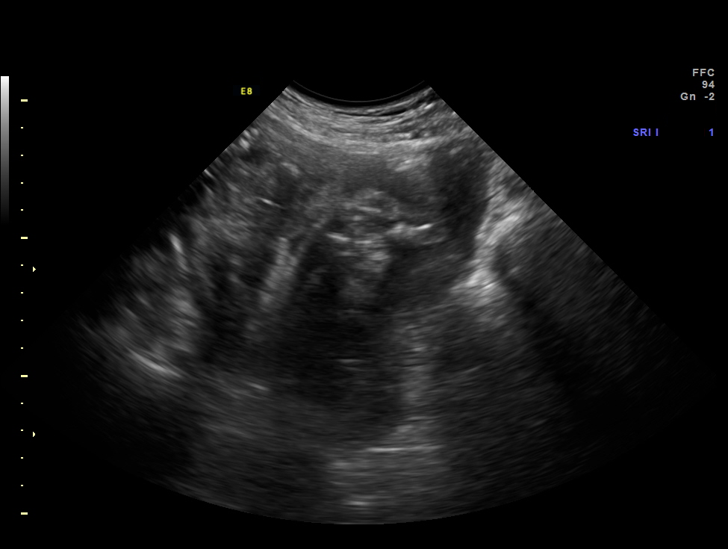

[12 of 28 positions shown; findings below may reference images not displayed]

OBSTETRICS REPORT
                      (Signed Final 03/29/2012 [DATE])

 Order#:         [id number]
Procedures

 US OB FOLLOW UP                                       76816.1
Indications

 Placental abruption
 History of genetic / anatomic abnormality -
 Previous pregnancy with unilateral club foot (pt
 father w/ same history)
 Assess Fetal Growth / Estimated Fetal Weight
Fetal Evaluation

 Fetal Heart Rate:  131                          bpm
 Cardiac Activity:  Observed
 Presentation:      Breech
 Placenta:          Anterior, above cervical os
 P. Cord            Previously Visualized
 Insertion:

 Amniotic Fluid
 AFI FV:      Subjectively within normal limits
Biometry

 BPD:     61.5  mm     G. Age:  25w 0d                CI:         70.5   70 - 86
 OFD:     87.2  mm                                    FL/HC:      19.5   18.6 -

 HC:     238.8  mm     G. Age:  26w 0d       20  %    HC/AC:      1.06   1.04 -

 AC:     225.3  mm     G. Age:  27w 0d       66  %    FL/BPD:     75.8   71 - 87
 FL:      46.6  mm     G. Age:  25w 4d       20  %    FL/AC:      20.7   20 - 24
 HUM:     42.8  mm     G. Age:  25w 4d       33  %
 CER:     31.2  mm     G. Age:  27w 1d       72  %

 Est. FW:     907  gm           2 lb     56  %
Gestational Age

 U/S Today:     25w 6d                                        EDD:   06/29/12
 Best:          26w 1d     Det. By:  U/S C R L (11/15/11)     EDD:   06/27/12
Anatomy
 Cranium:           Appears normal      Aortic Arch:       Appears normal
 Fetal Cavum:       Appears normal      Ductal Arch:       Appears normal
 Ventricles:        Appears normal      Diaphragm:         Previously seen
 Choroid Plexus:    Previously seen     Stomach:           Appears normal
 Cerebellum:        Appears normal      Abdomen:           Appears normal
 Posterior Fossa:   Appears normal      Abdominal Wall:    Previously seen
 Nuchal Fold:       Not applicable      Cord Vessels:      Previously seen
                    (>20 wks GA)
 Face:              Lips and orbits     Kidneys:           Appear normal
                    previously seen
 Heart:             Appears normal      Bladder:           Appears normal
                    (4 chamber &
                    axis)
 RVOT:              Appears normal      Spine:             Previously seen
 LVOT:              Appears normal      Limbs:             Previously seen

 Other:     Female gender. Technically difficult due to fetal position.
Targeted Anatomy

 Fetal Central Nervous System
 Cisterna Magna:
Impression

 IUP at 26+1 weeks
 Normal interval anatomy; fetus is in a footling breech
 presentation but most views of legs and feet appeared
 normal; fetus could be mildly affected - Ms. Klpigbb is aware
 Normal amniotic fluid volume
 Appropriate interval growth with EFW at the 56th %tile
 Extenting laterally from the right anterior edge of the placenta
 are multiple small sinusoidal-like structures - clinical
 significance is not known; plan to review with colleagues

Recommendations

 Will continue to follow

## 2014-06-02 ENCOUNTER — Emergency Department (HOSPITAL_COMMUNITY)
Admission: EM | Admit: 2014-06-02 | Discharge: 2014-06-02 | Disposition: A | Discharge status: Home / Self Care | Payer: BC Managed Care – PPO | Source: Home / Self Care | Attending: Family Medicine | Admitting: Family Medicine

## 2014-06-02 ENCOUNTER — Encounter (HOSPITAL_COMMUNITY): Payer: Self-pay | Admitting: Emergency Medicine

## 2014-06-02 DIAGNOSIS — N390 Urinary tract infection, site not specified: Secondary | ICD-10-CM

## 2014-06-02 LAB — POCT URINALYSIS DIP (DEVICE)
Bilirubin Urine: NEGATIVE
GLUCOSE, UA: NEGATIVE mg/dL
KETONES UR: NEGATIVE mg/dL
Nitrite: POSITIVE — AB
PROTEIN: 30 mg/dL — AB
SPECIFIC GRAVITY, URINE: 1.015 (ref 1.005–1.030)
Urobilinogen, UA: 1 mg/dL (ref 0.0–1.0)
pH: 8.5 — ABNORMAL HIGH (ref 5.0–8.0)

## 2014-06-02 LAB — POCT PREGNANCY, URINE: PREG TEST UR: NEGATIVE

## 2014-06-02 MED ORDER — CEPHALEXIN 500 MG PO CAPS: 500.0000 mg | ORAL_CAPSULE | Freq: Four times a day (QID) | ORAL | Status: DC

## 2014-06-02 MED ORDER — FLUCONAZOLE 150 MG PO TABS: 150.0000 mg | ORAL_TABLET | Freq: Once | ORAL | Status: DC

## 2014-06-02 NOTE — Discharge Instructions (Signed)
Take all of medicine as directed, drink lots of fluids, see your doctor if further problems. °

## 2014-06-02 NOTE — ED Notes (Signed)
Pt  Reports   Symptoms  Of  Low  abd  Pain  With frequency  Of   Urination     For  sev  Weeks  Worse  Recently     denys  Any  Vaginal  Bleeding  Or  Discharge

## 2014-06-02 NOTE — ED Provider Notes (Signed)
CSN: [id number]     Arrival date & time 06/02/14  1645 History   First MD Initiated Contact with Patient 06/02/14 1652     Chief Complaint  Patient presents with  . Urinary Tract Infection   (Consider location/radiation/quality/duration/timing/severity/associated sxs/prior Treatment) Patient is a 28 y.o. female presenting with urinary tract infection. The history is provided by the patient.  Urinary Tract Infection This is a new problem. The current episode started more than 1 week ago. The problem has been gradually worsening. Pertinent negatives include no chest pain, no abdominal pain and no shortness of breath.    Past Medical History  Diagnosis Date  . No pertinent past medical history   . Seasonal depression    Past Surgical History  Procedure Laterality Date  . No past surgeries    . Wisdom tooth extraction     Family History  Problem Relation Age of Onset  . Cancer Father   . Cancer Maternal Grandmother    History  Substance Use Topics  . Smoking status: Current Some Day Smoker -- 0.25 packs/day for 17 years    Types: Cigarettes    Last Attempt to Quit: 01/22/2012  . Smokeless tobacco: Never Used  . Alcohol Use: No   OB History   Grav Para Term Preterm Abortions TAB SAB Ect Mult Living   0 0 0 0 0 0 2     Review of Systems  Constitutional: Negative.   Respiratory: Negative for shortness of breath.   Cardiovascular: Negative for chest pain.  Gastrointestinal: Negative.  Negative for abdominal pain.  Genitourinary: Positive for dysuria, urgency and frequency. Negative for hematuria, vaginal bleeding, vaginal discharge and pelvic pain.    Allergies  Review of patient's allergies indicates no known allergies.  Home Medications   Prior to Admission medications   Medication Sig Start Date End Date Taking? Authorizing Provider  cephALEXin (KEFLEX) 500 MG capsule Take 1 capsule (500 mg total) by mouth 4 (four) times daily. Take all of medicine and drink  lots of fluids 06/02/14   Linna Hoff, MD  fluconazole (DIFLUCAN) 150 MG tablet Take 1 tablet (150 mg total) by mouth once. 06/02/14   Linna Hoff, MD  levonorgestrel (MIRENA) 20 MCG/24HR IUD 1 each by Intrauterine route once.    Historical Provider, MD  methocarbamol (ROBAXIN) 500 MG tablet Take 1 tablet (500 mg total) by mouth 2 (two) times daily. 07/06/13   Fayrene Helper, PA-C  oxyCODONE-acetaminophen (PERCOCET/ROXICET) 5-325 MG per tablet Take 2 tablets by mouth every 4 (four) hours as needed for pain. 07/06/13   Fayrene Helper, PA-C  predniSONE (DELTASONE) 10 MG tablet Take 2 tablets (20 mg total) by mouth daily. 07/06/13   Fayrene Helper, PA-C   BP 119/82  Pulse 87  Temp(Src) 98.9 F (37.2 C) (Oral)  SpO2 96%  LMP 05/15/2014 Physical Exam  Nursing note and vitals reviewed. Constitutional: She is oriented to person, place, and time. She appears well-developed and well-nourished.  Abdominal: Soft. Bowel sounds are normal. She exhibits no distension and no mass. There is no tenderness. There is no rebound and no guarding.  Neurological: She is alert and oriented to person, place, and time.  Skin: Skin is warm and dry.    ED Course  Procedures (including critical care time) Labs Review Labs Reviewed  POCT URINALYSIS DIP (DEVICE) - Abnormal; Notable for the following:    Hgb urine dipstick TRACE (*)    pH 8.5 (*)    Protein,  ur 30 (*)    Nitrite POSITIVE (*)    Leukocytes, UA TRACE (*)    All other components within normal limits  POCT PREGNANCY, URINE    Imaging Review No results found.   MDM   1. UTI (lower urinary tract infection)        Linna Hoff, MD 06/02/14 [phone]

## 2014-07-30 ENCOUNTER — Encounter (HOSPITAL_COMMUNITY): Payer: Self-pay | Admitting: Emergency Medicine

## 2014-09-28 ENCOUNTER — Emergency Department (HOSPITAL_COMMUNITY): Payer: BC Managed Care – PPO

## 2014-09-28 ENCOUNTER — Emergency Department (HOSPITAL_COMMUNITY)
Admission: EM | Admit: 2014-09-28 | Discharge: 2014-09-28 | Disposition: A | Discharge status: Home / Self Care | Payer: BC Managed Care – PPO | Attending: Emergency Medicine | Admitting: Emergency Medicine

## 2014-09-28 ENCOUNTER — Encounter (HOSPITAL_COMMUNITY): Payer: Self-pay | Admitting: *Deleted

## 2014-09-28 DIAGNOSIS — W450XXA Nail entering through skin, initial encounter: Secondary | ICD-10-CM | POA: Diagnosis not present

## 2014-09-28 DIAGNOSIS — Z79899 Other long term (current) drug therapy: Secondary | ICD-10-CM | POA: Insufficient documentation

## 2014-09-28 DIAGNOSIS — Y998 Other external cause status: Secondary | ICD-10-CM | POA: Insufficient documentation

## 2014-09-28 DIAGNOSIS — Z8659 Personal history of other mental and behavioral disorders: Secondary | ICD-10-CM | POA: Insufficient documentation

## 2014-09-28 DIAGNOSIS — Y9389 Activity, other specified: Secondary | ICD-10-CM | POA: Insufficient documentation

## 2014-09-28 DIAGNOSIS — S91332A Puncture wound without foreign body, left foot, initial encounter: Secondary | ICD-10-CM | POA: Insufficient documentation

## 2014-09-28 DIAGNOSIS — Z720 Tobacco use: Secondary | ICD-10-CM | POA: Insufficient documentation

## 2014-09-28 DIAGNOSIS — Y9289 Other specified places as the place of occurrence of the external cause: Secondary | ICD-10-CM | POA: Insufficient documentation

## 2014-09-28 DIAGNOSIS — Z7952 Long term (current) use of systemic steroids: Secondary | ICD-10-CM | POA: Diagnosis not present

## 2014-09-28 DIAGNOSIS — S91339A Puncture wound without foreign body, unspecified foot, initial encounter: Secondary | ICD-10-CM

## 2014-09-28 MED ORDER — TETANUS-DIPHTH-ACELL PERTUSSIS 5-2.5-18.5 LF-MCG/0.5 IM SUSP
0.5000 mL | Freq: Once | INTRAMUSCULAR | Status: AC
  Administered 2014-09-28: 0.5 mL via INTRAMUSCULAR
  Filled 2014-09-28: qty 0.5

## 2014-09-28 MED ORDER — CIPROFLOXACIN HCL 500 MG PO TABS: 500.0000 mg | ORAL_TABLET | Freq: Two times a day (BID) | ORAL | Status: DC

## 2014-09-28 MED ORDER — OXYCODONE-ACETAMINOPHEN 5-325 MG PO TABS: 1.0000 | ORAL_TABLET | ORAL | Status: DC | PRN

## 2014-09-28 MED ORDER — IBUPROFEN 400 MG PO TABS
800.0000 mg | ORAL_TABLET | Freq: Once | ORAL | Status: AC
  Administered 2014-09-28: 800 mg via ORAL
  Filled 2014-09-28: qty 2

## 2014-09-28 NOTE — ED Provider Notes (Signed)
CSN: [id number]     Arrival date & time 09/28/14  1936 History   First MD Initiated Contact with Patient 09/28/14 1946     Chief Complaint  Patient presents with  . Foreign Body   Patient is a 29 y.o. female presenting with foreign body. The history is provided by the patient and medical records. No language interpreter was used.  Foreign Body Associated symptoms: no nausea and no vomiting     This chart was scribed for non-physician practitioner Dierdre Forth, PA-C,  working with No att. providers found, by Andrew Au, ED Scribe. This patient was seen in room TR08C/TR08C and the patient's care was started at 1:41 AM.  Laura Guerra is a 29 y.o. female who presents to the Emergency Department complaining of foreign body in left foot that occurred 4 hours ago. Pt states she was fixing her mailbox when she stepped on a board containing a rusty nail. Pt states she pulled the nail out of foot but is unsure if entire nail was removed. Pt now has a "wierd" feeloing in 2nd left toe but denies numbness, weakness and tingling in left foot. Unknown last tetanus. No fever or chills, nausea or vomiting.  Past Medical History  Diagnosis Date  . No pertinent past medical history   . Seasonal depression    Past Surgical History  Procedure Laterality Date  . No past surgeries    . Wisdom tooth extraction     Family History  Problem Relation Age of Onset  . Cancer Father   . Cancer Maternal Grandmother    History  Substance Use Topics  . Smoking status: Current Every Day Smoker -- 0.25 packs/day for 17 years    Types: Cigarettes    Last Attempt to Quit: 01/22/2012  . Smokeless tobacco: Never Used  . Alcohol Use: No   OB History    Gravida Para Term Preterm AB TAB SAB Ectopic Multiple Living   0 0 0 0 0 0 2     Review of Systems  Constitutional: Negative for fever and chills.  Gastrointestinal: Negative for nausea and vomiting.  Musculoskeletal: Positive for arthralgias.  Negative for back pain, joint swelling, neck pain and neck stiffness.  Skin: Positive for wound.  Neurological: Negative for weakness and numbness.  Hematological: Does not bruise/bleed easily.  Psychiatric/Behavioral: The patient is not nervous/anxious.   All other systems reviewed and are negative.   Allergies  Review of patient's allergies indicates no known allergies.  Home Medications   Prior to Admission medications   Medication Sig Start Date End Date Taking? Authorizing Provider  cephALEXin (KEFLEX) 500 MG capsule Take 1 capsule (500 mg total) by mouth 4 (four) times daily. Take all of medicine and drink lots of fluids 06/02/14   Linna Hoff, MD  ciprofloxacin (CIPRO) 500 MG tablet Take 1 tablet (500 mg total) by mouth every 12 (twelve) hours. 09/28/14   Derric Dealmeida, PA-C  fluconazole (DIFLUCAN) 150 MG tablet Take 1 tablet (150 mg total) by mouth once. 06/02/14   Linna Hoff, MD  levonorgestrel (MIRENA) 20 MCG/24HR IUD 1 each by Intrauterine route once.    Historical Provider, MD  methocarbamol (ROBAXIN) 500 MG tablet Take 1 tablet (500 mg total) by mouth 2 (two) times daily. 07/06/13   Fayrene Helper, PA-C  oxyCODONE-acetaminophen (PERCOCET) 5-325 MG per tablet Take 1-2 tablets by mouth every 4 (four) hours as needed. 09/28/14   Jamal Haskin, PA-C  predniSONE (DELTASONE) 10 MG  tablet Take 2 tablets (20 mg total) by mouth daily. 07/06/13   Fayrene Helper, PA-C   BP 108/61 mmHg  Pulse 75  Temp(Src) 97.9 F (36.6 C) (Oral)  Resp 16  Ht  (1.651 m)  Wt 167 lb (75.751 kg)  BMI 27.79 kg/m2  SpO2 99%  LMP  Physical Exam  Constitutional: She is oriented to person, place, and time. She appears well-developed and well-nourished. No distress.  HENT:  Head: Normocephalic and atraumatic.  Eyes: Conjunctivae are normal. No scleral icterus.  Neck: Normal range of motion.  Cardiovascular: Normal rate, regular rhythm, normal heart sounds and intact distal pulses.   No murmur  heard. Capillary refill < 3 sec  Pulmonary/Chest: Effort normal and breath sounds normal. No respiratory distress.  Musculoskeletal: Normal range of motion. She exhibits tenderness. She exhibits no edema.  ROM: Full range of motion of all toes on the foot, full range of motion of the left ankle; tender to palpation of the ball of the foot  Neurological: She is alert and oriented to person, place, and time. Coordination normal.  Sensation:  Intact to dull and sharp in the left lower extremity Strength: 5/5 in bilateral lower extremities  Skin: Skin is warm and dry. She is not diaphoretic.  No tenting of the skin Puncture wound to the sole of the left foot without bleeding  Psychiatric: She has a normal mood and affect.  Nursing note and vitals reviewed.   ED Course  Procedures (including critical care time) DIAGNOSTIC STUDIES: Oxygen Saturation is 98% on RA, normal by my interpretation.    COORDINATION OF CARE: 1:41 AM- Pt advised of plan for treatment and pt agrees.  Labs Review Labs Reviewed - No data to display  Imaging Review Dg Foot Complete Left  09/28/2014   CLINICAL DATA:  Puncture wound on the plantar aspect of the foot at the second MTP joint. Injury today. Initial encounter.  EXAM: LEFT FOOT - COMPLETE 3+ VIEW  COMPARISON:  None.  FINDINGS: No fracture or radiopaque foreign body. Alignment of the bones of the foot is anatomic.  IMPRESSION: Negative.   Electronically Signed   By: Andreas Newport M.D.   On: 09/28/2014 20:24     EKG Interpretation None      MDM   Final diagnoses:  Puncture wound of foot   Laura Guerra presents with puncture wound of the undersurface of the left foot from a rusty nail. X-ray without evidence of foreign body retained. Wound irrigated with copious amounts of saline. Tightness Updated. Patient placed on ciprofloxacin and discharged home. She is to return to the emergency department for worsening symptoms or signs of infection. She  is to follow-up with her primary care physician in 3 days for further evaluation.  I have personally reviewed patient's vitals, nursing note and any pertinent labs or imaging.  I performed an focused physical exam; undressed when appropriate .    It has been determined that no acute conditions requiring further emergency intervention are present at this time. The patient/guardian have been advised of the diagnosis and plan. I reviewed any labs and imaging including any potential incidental findings. We have discussed signs and symptoms that warrant return to the ED and they are listed in the discharge instructions.    Vital signs are stable at discharge.   BP 108/61 mmHg  Pulse 75  Temp(Src) 97.9 F (36.6 C) (Oral)  Resp 16  Ht  (1.651 m)  Wt 167 lb (75.751 kg)  BMI 27.79 kg/m2  SpO2 99%  LMP   I personally performed the services described in this documentation, which was scribed in my presence. The recorded information has been reviewed and is accurate.     Dahlia Client Lyliana Dicenso, PA-C 09/29/14 0142  Olivia Mackie, MD 09/29/14 [phone]

## 2014-09-28 NOTE — Discharge Instructions (Signed)
1. Medications: Cipro, Percocet, usual home medications 2. Treatment: rest, drink plenty of fluids, keep wound clean with warm soap and water 3. Follow Up: Please followup with your primary doctor in 3 days for discussion of your diagnoses and further evaluation after today's visit; if you do not have a primary care doctor use the resource guide provided to find one; Please return to the ER for increased swelling, increasing pain, redness, increased warmth of the foot, streaking, fevers or other concerning symptoms    Puncture Wound A puncture wound is an injury that extends through all layers of the skin and into the tissue beneath the skin (subcutaneous tissue). Puncture wounds become infected easily because germs often enter the body and go beneath the skin during the injury. Having a deep wound with a small entrance point makes it difficult for your caregiver to adequately clean the wound. This is especially true if you have stepped on a nail and it has passed through a dirty shoe or other situations where the wound is obviously contaminated. CAUSES  Many puncture wounds involve glass, nails, splinters, fish hooks, or other objects that enter the skin (foreign bodies). A puncture wound may also be caused by a human bite or animal bite. DIAGNOSIS  A puncture wound is usually diagnosed by your history and a physical exam. You may need to have an X-ray or an ultrasound to check for any foreign bodies still in the wound. TREATMENT   Your caregiver will clean the wound as thoroughly as possible. Depending on the location of the wound, a bandage (dressing) may be applied.  Your caregiver might prescribe antibiotic medicines.  You may need a follow-up visit to check on your wound. Follow all instructions as directed by your caregiver. HOME CARE INSTRUCTIONS   Change your dressing once per day, or as directed by your caregiver. If the dressing sticks, it may be removed by soaking the area in  water.  If your caregiver has given you follow-up instructions, it is very important that you return for a follow-up appointment. Not following up as directed could result in a chronic or permanent injury, pain, and disability.  Only take over-the-counter or prescription medicines for pain, discomfort, or fever as directed by your caregiver.  If you are given antibiotics, take them as directed. Finish them even if you start to feel better. You may need a tetanus shot if:  You cannot remember when you had your last tetanus shot.  You have never had a tetanus shot. If you got a tetanus shot, your arm may swell, get red, and feel warm to the touch. This is common and not a problem. If you need a tetanus shot and you choose not to have one, there is a rare chance of getting tetanus. Sickness from tetanus can be serious. You may need a rabies shot if an animal bite caused your puncture wound. SEEK MEDICAL CARE IF:   You have redness, swelling, or increasing pain in the wound.  You have red streaks going away from the wound.  You notice a bad smell coming from the wound or dressing.  You have yellowish-white fluid (pus) coming from the wound.  You are treated with an antibiotic for infection, but the infection is not getting better.  You notice something in the wound, such as rubber from your shoe, cloth, or another object.  You have a fever.  You have severe pain.  You have difficulty breathing.  You feel dizzy or faint.  You cannot stop vomiting.  You lose feeling, develop numbness, or cannot move a limb below the wound.  Your symptoms worsen. MAKE SURE YOU:  Understand these instructions.  Will watch your condition.  Will get help right away if you are not doing well or get worse. Document Released: 06/24/2005 Document Revised: 12/07/2011 Document Reviewed: 03/03/2011 [facility] Patient Information 2015 [place], [place]. This information is not intended to replace advice  given to you by your health care provider. Make sure you discuss any questions you have with your health care provider.   Emergency Department Resource Guide 1) Find a Doctor and Pay Out of Pocket Although you won't have to find out who is covered by your insurance plan, it is a good idea to ask around and get recommendations. You will then need to call the office and see if the doctor you have chosen will accept you as a new patient and what types of options they offer for patients who are self-pay. Some doctors offer discounts or will set up payment plans for their patients who do not have insurance, but you will need to ask so you aren't surprised when you get to your appointment.  2) Contact Your Local Health Department Not all health departments have doctors that can see patients for sick visits, but many do, so it is worth a call to see if yours does. If you don't know where your local health department is, you can check in your phone book. The CDC also has a tool to help you locate your state's health department, and many state websites also have listings of all of their local health departments.  3) Find a Walk-in Clinic If your illness is not likely to be very severe or complicated, you may want to try a walk in clinic. These are popping up all over the country in pharmacies, drugstores, and shopping centers. They're usually staffed by nurse practitioners or physician assistants that have been trained to treat common illnesses and complaints. They're usually fairly quick and inexpensive. However, if you have serious medical issues or chronic medical problems, these are probably not your best option.  No Primary Care Doctor: - Call Health Connect at  [phone] - they can help you locate a primary care doctor that  accepts your insurance, provides certain services, etc. - Physician Referral Service- [phone]  Chronic Pain Problems: Organization         Address  Phone   Notes  [facility]  [phone] Patients need to be referred by their primary care doctor.   Medication Assistance: Organization         Address  Phone   Notes  [facility] Medication [facility] [address] [place] [place], [place] [address] [phone] --Must be a resident of [facility] -- Must have NO insurance coverage whatsoever (no Medicaid/ Medicare, etc.) -- The pt. MUST have a primary care doctor that directs their care regularly and follows them in the community   [facility]ist  [phone]   [employer]  [phone]    Agencies that provide inexpensive medical care: Organization         Address  Phone   Notes  [facility]  [phone]   [facility]    [phone]   [facility] [address] [place], [place] [address] [phone]   [facility] [address] [place] [phone]  Planned Parenthood    [phone]   [facility]    [phone]   [facility] and [facility]  [address], [place] Phone:  [phone], Fax:  [phone] Hours of Operation:  9 am - 6 pm, M-F.  Also accepts Medicaid/Medicare and self-pay.  [facility]  [address], [place] Phone: [phone], Fax: [phone]. Hours of Operation:  8:30 am - 5:30 pm, M-F.  Also accepts Medicaid and self-pay.  [facility] [place] [address], [place] Phone: [phone]   [facility] [address] Natasha Bence [place], [place] [phone], Ext. [phone] Mondays & Thursdays: 7-9 AM.  First 15 patients are seen on a first come, first serve basis.    Medicaid-accepting [facility] Providers:  Organization         Address  Phone   Notes  [facility] [address], [place] [phone] Also accepts self-pay patients.  [facility] [address] Laurell Josephs [place], [place]  [phone]   [facility] [address], [place] [phone]   [facility] [address] [place] [phone]   Renaye Rakers [address], [place]   [phone] Only accepts [place] Access [place] patients after they have their name applied to their card.   Self-Pay (no insurance) in [facility]:  Organization         Address  Phone   Notes  Sickle Cell Patients, [facility] [address] [place], [place] [phone]   [facility] [address] [place], [place] [phone]   [facility] [place]  [address], [place] [phone]   [facility]/Dr. Osei-Bonsu  [address] [place] or [address], [place] [phone] Phone number for both [place] and [place] locations is the same.  [facility] [address], [place] [phone]   [facility] [address], [place] or [address] [phone]   [facility] [address], [place] [phone], phone; [phone], fax Sees patients 1st and 3rd Saturday of every month.  Must not qualify for public or private insurance (i.e. Medicaid, Medicare, [place] Health Choice, Veterans' Benefits)  Household income should be no more than 200% of the poverty level The clinic cannot treat you if you are pregnant or think you are pregnant  Sexually transmitted diseases are not treated at the clinic.    Dental Care: Organization         Address  Phone  Notes  [facility] Department of [facility] [address] [place], [place] [phone] Accepts children up to age 30 who are enrolled in [place] or [place] Health Choice; pregnant women with a Medicaid card; and children who have applied for  Medicaid or [place] Health Choice, but were declined, whose parents can pay a reduced fee at time of service.  [facility] Department of [facility]  [address], [place] [phone] Accepts children up to age 36 who are enrolled in [place] or [place] Health Choice; pregnant women with a Medicaid card; and children who have applied for Medicaid or [place] Health Choice, but were declined,  whose parents can pay a reduced fee at time of service.  [facility]  [address] [place], [place] [phone] Patients are seen by appointment only. Walk-ins are not accepted. [facility] will see patients 81 years of age and older. Monday - Tuesday (8am-5pm) Most Wednesdays (8:30-5pm) $30 per visit, cash only  [facility]  [address], [facility] [phone] Patients are seen by appointment only. Walk-ins are not accepted. [facility] will see patients 87 years of age and older. One Wednesday Evening (Monthly: Volunteer Based).  $30 per visit, cash only  [employer]  [phone] for adults; Children under age 74, call Graduate Pediatric Dentistry at [phone]. Children aged 12-14, please call [phone] to request a pediatric application.  Dental services are provided in all areas of dental care including fillings, crowns and bridges, complete and partial dentures, implants, gum treatment, root canals, and extractions. Preventive care is also provided. Treatment is provided to both adults and children. Patients are selected via a lottery and there is often a waiting list.   [facility] [address], [place]  [phone] [email or web address]   [facility] [address] [place], [place] [phone], Ext. [phone] Second and Fourth Thursday of each month, opens at 6:30 AM; Clinic ends at 9 AM.  Patients are seen on a first-come first-served basis, and a limited number  are seen during each clinic.   [facility]  [address] Ether Griffins [place], [place] [phone]   Eligibility Requirements You must have lived in [place], [place], or [place] counties for at least the last three months.   You cannot be eligible for state or federal sponsored [place], including [employer], [place], or [employer].   You generally cannot be eligible for healthcare insurance through your employer.    How to apply: Eligibility screenings are held every Tuesday and Wednesday afternoon from 1:00 pm until 4:00 pm. You do not need an appointment for the interview!  [facility] [address], [place], [place] [phone]   [facility]  [phone]   [facility]  [phone]   [facility] Health Department  [phone]    Behavioral Health Resources in the Community: Intensive Outpatient Programs Organization         Address  Phone  Notes  [facility] [address], [place], [place] [phone]   [facility] Outpatient [address] [place], [place] [phone]   [facility] [address] [place], [place]  [phone]   [facility] [address],  [place], [place] [phone] or [phone]   Substance Abuse Resources Organization         Address  Phone  Notes  [facility] Drug Services  [phone]   [facility]  [phone]   [facility]  [phone]   Floydene Flock  [phone]   Residential & Outpatient Substance Abuse Program  [phone]   Psychological Services Organization         Address  Phone  Notes  [facility]  [phone]   [facility] Services  [phone]   [facility] [address], [place] [phone] or [phone]    Mobile Crisis Teams Organization         Address  Phone  Notes  Therapeutic  Alternatives, Mobile  Crisis Care Unit  [phone]   [facility]  [address] [place], [place] [phone]   [facility] [address] [place] [phone]    Self-Help/Support Groups Organization         Address  Phone             Notes  [facility] [place] - variety of support groups  336- I7437963 Call for more information  Narcotics Anonymous (NA), [facility] [address], [employer] [place]  2 meetings at this location   Statistician         Address  Phone  Notes  [facility] [address],    [place]  [phone]   [facility]  [address] [place] [address], [place], [place] [phone]   [facility] [name] [place], [place] [phone] Admissions: 8am-3pm M-F  [facility] [address]    [place], [place] [phone]   [facility] [address] [place], [place], [place] [phone]   [facility] [address]  [place], [place] [phone]   [facility] - Intensive Outpatient [address]ure[address] Joseph[address], [place], [place] [phone]   [facility] ([facility] [address] [place]  [place], [place] [phone] or [phone]   [facility] ([facility]) [address], [place], [place] [phone] Accepts Medicaid  [facility] [address]  [place] [phone] Substance Abuse/Addiction Treatment   [facility] Organization         Address  Phone  Notes  [facility]  [phone]   Angie Fava, PhD [address] Ervin Knack [place], [place]   [phone] or (610) [phone]   [facility] Behavioral   [address] [place], [place] [phone]   [facility] [address] [place], [place] [phone] Insurance/Medicaid/sponsorship through [facility] and Families  [address]                                    [place], [place] [phone] Therapy/tele-psych/case  [facility] [address], [place] [phone]    Dr. Lolly Mustache  [phone]   [facility]  [employer] [facility] Dept. 1) [address], [place] 2) [address] [place] 3)  [address], [place] [phone]  [phone]   [facility] Child Abuse Hotline 781-124-3536 or [phone] (After Hours)

## 2014-09-28 NOTE — ED Notes (Signed)
Patient presents stating she stepped on a nail with her left foot.  Patient had a shoe on when she stepped on a nail

## 2015-02-26 ENCOUNTER — Telehealth: Payer: Self-pay

## 2015-02-26 NOTE — Telephone Encounter (Signed)
Please advise if willing to take on as a new patient. Thanks

## 2015-02-26 NOTE — Telephone Encounter (Signed)
sure

## 2015-02-27 NOTE — Telephone Encounter (Signed)
Patient notified

## 2015-02-28 ENCOUNTER — Ambulatory Visit (INDEPENDENT_AMBULATORY_CARE_PROVIDER_SITE_OTHER): Payer: BLUE CROSS/BLUE SHIELD | Admitting: Family

## 2015-02-28 ENCOUNTER — Encounter: Payer: Self-pay | Admitting: Family

## 2015-02-28 VITALS — BP 108/64 | HR 76 | Temp 98.1°F | Resp 18 | Ht 65.0 in | Wt 164.8 lb

## 2015-02-28 DIAGNOSIS — N898 Other specified noninflammatory disorders of vagina: Secondary | ICD-10-CM | POA: Insufficient documentation

## 2015-02-28 DIAGNOSIS — Z720 Tobacco use: Secondary | ICD-10-CM | POA: Diagnosis not present

## 2015-02-28 DIAGNOSIS — Z840 Family history of diseases of the skin and subcutaneous tissue: Secondary | ICD-10-CM | POA: Diagnosis not present

## 2015-02-28 DIAGNOSIS — F17200 Nicotine dependence, unspecified, uncomplicated: Secondary | ICD-10-CM | POA: Insufficient documentation

## 2015-02-28 DIAGNOSIS — Z831 Family history of other infectious and parasitic diseases: Secondary | ICD-10-CM

## 2015-02-28 MED ORDER — ALBENDAZOLE 200 MG PO TABS
ORAL_TABLET | ORAL | Status: DC
Start: 2015-02-28 — End: 2015-03-11

## 2015-02-28 MED ORDER — FLUCONAZOLE 150 MG PO TABS: 150.0000 mg | ORAL_TABLET | Freq: Once | ORAL | Status: DC

## 2015-02-28 NOTE — Assessment & Plan Note (Signed)
Indicates she is contemplating tobacco cessation. Discussed possibilities of gums, patches, Wellbutrin, and Chantix. Discussed risks and benefits of all treatment and continued risk of smoking. Follow-up pending research and decision.

## 2015-02-28 NOTE — Patient Instructions (Signed)
Thank you for choosing [facility].  Summary/Instructions:  Your prescription(s) have been submitted to your pharmacy or been printed and provided for you. Please take as directed and contact our office if you believe you are having problem(s) with the medication(s) or have any questions.  If your symptoms worsen or fail to improve, please contact our office for further instruction, or in case of emergency go directly to the emergency room at the closest medical facility.    Pinworms Your caregiver has diagnosed you as having pinworms. These are common infections of children and less common in adults. Pinworms are a small white worm less one quarter to a half inch in length. They look like a tiny piece of white thread. A person gets pinworms by swallowing the eggs of the worm. These eggs are obtained from contaminated (infected or tainted) food, clothing, toys, or any object that comes in contact with the body and mouth. The eggs hatch in the small bowel (intestine) and quickly develop into adult worms in the large bowel (colon). The female worm develops in the large intestine for about two to four weeks. It lays eggs around the anus during the night. These eggs then contaminate clothing, fingers, bedding, and anything else they come in contact with. The main symptoms (problems) of pinworms are itching around the anus (pruritus ani) at night. Children may also have occasional abdominal (belly) pain, loss of appetite, problems sleeping, and irritability. If you or your child has continual anal itching at night, that is a good sign to consult your caregiver. Just about everybody at some time in their life has acquired pinworms. Getting them has nothing to do with the cleanliness of your household or your personal hygiene. Complications are uncommon. DIAGNOSIS  Diagnosis can be made by looking at your child's anus at night when the pinworms are laying eggs or by sticking a piece of scotch tape on the  anus in the morning. The eggs will stick to the tape. This can be examined by your caregiver who can make a diagnosis by looking at the tape under a microscope. Sometimes several scotch tape swabs will be necessary.  HOME CARE INSTRUCTIONS   Your caregiver will give you medications. They should be taken as directed. Eggs are easily passed. The whole family often needs treatment even if no symptoms are present. Several treatments may be necessary. A second treatment is usually needed after two weeks to a month.  Maintain strict hygiene. Washing hands often and keeping the nails short is helpful. Children often scratch themselves at night in their sleep so the eggs get under the nail. This causes reinfection by hand to mouth contamination.  Change bedding and clothing daily. These should be washed in hot water and dried. This kills the eggs and stops the life cycle of the worm.  Pets are not known to carry pinworms.  An ointment may be used at night for anal itching.  See your caregiver if problems continue. Document Released: 09/11/2000 Document Revised: 12/07/2011 Document Reviewed: 09/11/2008 [facility] Patient Information 2015 [employer] [place]. This information is not intended to replace advice given to you by your health care provider. Make sure you discuss any questions you have with your health care provider.

## 2015-02-28 NOTE — Progress Notes (Signed)
Pre visit review using our clinic review tool, if applicable. No additional management support is needed unless otherwise documented below in the visit note. 

## 2015-02-28 NOTE — Assessment & Plan Note (Signed)
Children at home were recently diagnosed with pinworms and treated. Attempted over-the-counter treatments but unable unable to tolerate to cover herself. Start albendazole. Patient instructed to take 2 tablets once and then 2 tablets 2 weeks later. Follow-up if symptoms develop or sooner if needed.

## 2015-02-28 NOTE — Assessment & Plan Note (Signed)
Symptoms and exam consistent with potential yeast infection, however cannot rule out bacterial vaginosis. Patient declined wet prep today. Instructed to follow-up with gynecology if symptoms worsen. Start fluconazole to cover for yeast infection. Follow up if symptoms worsen or fail to improve.

## 2015-02-28 NOTE — Progress Notes (Signed)
Subjective:    Patient ID: Laura Guerra, female    DOB: [birth year], 29 y.o.   MRN: [id number]  Chief Complaint  Patient presents with  . Pin worms    one of her daughters was diagnosed with pin worm and the doctor gave the whole house something for treatment, she could not keep that down and wanted to know if she could get something in pill form to take    HPI:  Laura Guerra is a 29 y.o. female with a PMH of asthma, sciatica nerve pain, and tobacco use who presents today for an office visit prior to establishing care.    1.) Pinworms - Daughter was recently diagnosed and treated for pinworms by her pediatrician. She tried over the counter Reese's Pinworm medicine but was not able to complete the treatment. She is not currently experiencing any symptoms of anal or nighttime itching.   2.) Vaginal itching - Associated symptom of vaginal itching has been going on for several days. Indicates that she is prone to yeast infections and this feels like a yeast infection with discharge that is described as white. Denies abdominal pain, fevers or chills.  3.) Smoking cessation - Currently smokes approximately 1 pack per day and has been smoking for about 20 years. Indicates that she really needs to quit smoking and has several questions regarding methods to quit smoking.   No Known Allergies  Outpatient Prescriptions Prior to Visit  Medication Sig Dispense Refill  . levonorgestrel (MIRENA) 20 MCG/24HR IUD 1 each by Intrauterine route once.    . cephALEXin (KEFLEX) 500 MG capsule Take 1 capsule (500 mg total) by mouth 4 (four) times daily. Take all of medicine and drink lots of fluids 20 capsule 0  . ciprofloxacin (CIPRO) 500 MG tablet Take 1 tablet (500 mg total) by mouth every 12 (twelve) hours. 20 tablet 0  . fluconazole (DIFLUCAN) 150 MG tablet Take 1 tablet (150 mg total) by mouth once. 1 tablet 1  . methocarbamol (ROBAXIN) 500 MG tablet Take 1 tablet (500 mg total) by mouth  2 (two) times daily. 20 tablet 0  . oxyCODONE-acetaminophen (PERCOCET) 5-325 MG per tablet Take 1-2 tablets by mouth every 4 (four) hours as needed. 10 tablet 0  . predniSONE (DELTASONE) 10 MG tablet Take 2 tablets (20 mg total) by mouth daily. 15 tablet 0   No facility-administered medications prior to visit.     Past Medical History  Diagnosis Date  . No pertinent past medical history   . Seasonal depression      Past Surgical History  Procedure Laterality Date  . No past surgeries    . Wisdom tooth extraction       Family History  Problem Relation Age of Onset  . Cancer Father   . Cancer Maternal Grandmother      History   Social History  . Marital Status: Single    Spouse Name: N/A  . Number of Children: 2  . Years of Education: 12   Occupational History  . Academic librarian    Social History Main Topics  . Smoking status: Current Every Day Smoker -- 1.00 packs/day for 20 years    Types: Cigarettes  . Smokeless tobacco: Never Used  . Alcohol Use: No     Comment: occasionally  . Drug Use: No  . Sexual Activity: Not Currently    Birth Control/ Protection: None, IUD     Comment: last intercourse 06/09/12   Other Topics Concern  .  Not on file   Social History Narrative    Review of Systems  Constitutional: Negative for fever and chills.  Gastrointestinal: Negative for anal bleeding.  Genitourinary: Positive for vaginal discharge.      Objective:    BP 108/64 mmHg  Pulse 76  Temp(Src) 98.1 F (36.7 C) (Oral)  Resp 18  Ht 5\' 5"  (1.651 m)  Wt 164 lb 12.8 oz (74.753 kg)  BMI 27.42 kg/m2  SpO2 95% Nursing note and vital signs reviewed.  Physical Exam  Constitutional: She is oriented to person, place, and time. She appears well-developed and well-nourished. No distress.  Cardiovascular: Normal rate, regular rhythm, normal heart sounds and intact distal pulses.   Pulmonary/Chest: Effort normal and breath sounds normal.  Genitourinary:  Declines  wet prep.   Neurological: She is alert and oriented to person, place, and time.  Skin: Skin is warm and dry.  Psychiatric: She has a normal mood and affect. Her behavior is normal. Judgment and thought content normal.       Assessment & Plan:   Problem List Items Addressed This Visit      Other   Family history of pinworm infection    Children at home were recently diagnosed with pinworms and treated. Attempted over-the-counter treatments but unable unable to tolerate to cover herself. Start albendazole. Patient instructed to take 2 tablets once and then 2 tablets 2 weeks later. Follow-up if symptoms develop or sooner if needed.      Relevant Medications   albendazole (ALBENZA) 200 MG tablet   Tobacco use disorder - Primary    Indicates she is contemplating tobacco cessation. Discussed possibilities of gums, patches, Wellbutrin, and Chantix. Discussed risks and benefits of all treatment and continued risk of smoking. Follow-up pending research and decision.      Vaginal discharge    Symptoms and exam consistent with potential yeast infection, however cannot rule out bacterial vaginosis. Patient declined wet prep today. Instructed to follow-up with gynecology if symptoms worsen. Start fluconazole to cover for yeast infection. Follow up if symptoms worsen or fail to improve.      Relevant Medications   fluconazole (DIFLUCAN) 150 MG tablet

## 2015-03-11 ENCOUNTER — Ambulatory Visit (INDEPENDENT_AMBULATORY_CARE_PROVIDER_SITE_OTHER): Payer: BLUE CROSS/BLUE SHIELD | Admitting: Internal Medicine

## 2015-03-11 ENCOUNTER — Encounter: Payer: Self-pay | Admitting: Internal Medicine

## 2015-03-11 ENCOUNTER — Other Ambulatory Visit (INDEPENDENT_AMBULATORY_CARE_PROVIDER_SITE_OTHER): Payer: BLUE CROSS/BLUE SHIELD

## 2015-03-11 VITALS — BP 116/72 | HR 82 | Temp 98.1°F | Resp 16 | Ht 65.0 in | Wt 165.0 lb

## 2015-03-11 DIAGNOSIS — Z0000 Encounter for general adult medical examination without abnormal findings: Secondary | ICD-10-CM

## 2015-03-11 LAB — COMPREHENSIVE METABOLIC PANEL
ALK PHOS: 52 U/L (ref 39–117)
ALT: 9 U/L (ref 0–35)
AST: 13 U/L (ref 0–37)
Albumin: 4.6 g/dL (ref 3.5–5.2)
BILIRUBIN TOTAL: 1.2 mg/dL (ref 0.2–1.2)
BUN: 10 mg/dL (ref 6–23)
CO2: 30 mEq/L (ref 19–32)
CREATININE: 0.7 mg/dL (ref 0.40–1.20)
Calcium: 9.5 mg/dL (ref 8.4–10.5)
Chloride: 104 mEq/L (ref 96–112)
GFR: 105.12 mL/min (ref >60.00)
Glucose, Bld: 94 mg/dL (ref 70–99)
POTASSIUM: 4.2 meq/L (ref 3.5–5.1)
Sodium: 139 mEq/L (ref 135–145)
Total Protein: 6.9 g/dL (ref 6.0–8.3)

## 2015-03-11 LAB — CBC WITH DIFFERENTIAL/PLATELET
BASOS ABS: 0.1 10*3/uL (ref 0.0–0.1)
Basophils Relative: 0.6 % (ref 0.0–3.0)
Eosinophils Absolute: 0.2 10*3/uL (ref 0.0–0.7)
Eosinophils Relative: 2.1 % (ref 0.0–5.0)
HCT: 47.5 % — ABNORMAL HIGH (ref 36.0–46.0)
Hemoglobin: 16.1 g/dL — ABNORMAL HIGH (ref 12.0–15.0)
LYMPHS ABS: 3 10*3/uL (ref 0.7–4.0)
LYMPHS PCT: 31.8 % (ref 12.0–46.0)
MCHC: 33.8 g/dL (ref 30.0–36.0)
MCV: 89.5 fl (ref 78.0–100.0)
MONO ABS: 0.6 10*3/uL (ref 0.1–1.0)
MONOS PCT: 6.5 % (ref 3.0–12.0)
Neutro Abs: 5.5 10*3/uL (ref 1.4–7.7)
Neutrophils Relative %: 59 % (ref 43.0–77.0)
PLATELETS: 235 10*3/uL (ref 150.0–400.0)
RBC: 5.31 Mil/uL — ABNORMAL HIGH (ref 3.87–5.11)
RDW: 13.1 % (ref 11.5–15.5)
WBC: 9.3 10*3/uL (ref 4.0–10.5)

## 2015-03-11 LAB — TSH: TSH: 2.56 u[IU]/mL (ref 0.35–4.50)

## 2015-03-11 LAB — LIPID PANEL
CHOL/HDL RATIO: 3
CHOLESTEROL: 143 mg/dL (ref 0–200)
HDL: 40.9 mg/dL (ref >39.00)
LDL CALC: 67 mg/dL (ref 0–99)
NonHDL: 102.1
TRIGLYCERIDES: 174 mg/dL — AB (ref 0.0–149.0)
VLDL: 34.8 mg/dL (ref 0.0–40.0)

## 2015-03-11 NOTE — Progress Notes (Signed)
Pre visit review using our clinic review tool, if applicable. No additional management support is needed unless otherwise documented below in the visit note. 

## 2015-03-11 NOTE — Progress Notes (Signed)
Subjective:  Patient ID: Laura Guerra, female    DOB: 01/04/1986  Age: 29 y.o. MRN: [id number]  CC: Annual Exam   HPI Sherica Frometa Neidlinger presents for a CPX - she feels well and offers no complaints, she is not very motivated to quit smoking at this time.  Outpatient Prescriptions Prior to Visit  Medication Sig Dispense Refill  . levonorgestrel (MIRENA) 20 MCG/24HR IUD 1 each by Intrauterine route once.    Marland Kitchen albendazole (ALBENZA) 200 MG tablet Take 2 pills for 1 day and then 2 pills 2 weeks later. (Patient not taking: Reported on 03/11/2015) 4 tablet 0  . fluconazole (DIFLUCAN) 150 MG tablet Take 1 tablet (150 mg total) by mouth once. (Patient not taking: Reported on 03/11/2015) 1 tablet 2   No facility-administered medications prior to visit.    ROS Review of Systems  Constitutional: Negative.  Negative for fever, chills, diaphoresis, appetite change and fatigue.  HENT: Negative.   Eyes: Negative.   Respiratory: Negative.  Negative for cough, choking, chest tightness, shortness of breath and stridor.   Cardiovascular: Negative.  Negative for chest pain, palpitations and leg swelling.  Gastrointestinal: Negative.  Negative for nausea, vomiting, abdominal pain, diarrhea, constipation and blood in stool.  Endocrine: Negative.   Genitourinary: Negative.   Musculoskeletal: Negative.   Skin: Negative.  Negative for rash.  Allergic/Immunologic: Negative.   Neurological: Negative.  Negative for dizziness, tremors, syncope, light-headedness, numbness and headaches.  Hematological: Negative.  Negative for adenopathy. Does not bruise/bleed easily.  Psychiatric/Behavioral: Negative.     Objective:  BP 116/72 mmHg  Pulse 82  Temp(Src) 98.1 F (36.7 C) (Oral)  Resp 16  Ht 5\' 5"  (1.651 m)  Wt 165 lb (74.844 kg)  BMI 27.46 kg/m2  SpO2 98%  LMP 01/09/2015 (LMP Unknown)  BP Readings from Last 3 Encounters:  03/11/15 116/72  02/28/15 108/64  09/28/14 108/61    Wt Readings  from Last 3 Encounters:  03/11/15 165 lb (74.844 kg)  02/28/15 164 lb 12.8 oz (74.753 kg)  09/28/14 167 lb (75.751 kg)    Physical Exam  Constitutional: She is oriented to person, place, and time. She appears well-developed and well-nourished. No distress.  HENT:  Head: Normocephalic and atraumatic.  Mouth/Throat: Oropharynx is clear and moist. No oropharyngeal exudate.  Eyes: Conjunctivae are normal. Right eye exhibits no discharge. Left eye exhibits no discharge. No scleral icterus.  Neck: Normal range of motion. Neck supple. No JVD present. No tracheal deviation present. No thyromegaly present.  Cardiovascular: Normal rate, regular rhythm, normal heart sounds and intact distal pulses.  Exam reveals no gallop and no friction rub.   No murmur heard. Pulmonary/Chest: Effort normal and breath sounds normal. No stridor. No respiratory distress. She has no wheezes. She has no rales. She exhibits no tenderness.  Abdominal: Soft. Bowel sounds are normal. She exhibits no distension and no mass. There is no tenderness. There is no rebound and no guarding.  Musculoskeletal: Normal range of motion. She exhibits no edema or tenderness.  Lymphadenopathy:    She has no cervical adenopathy.  Neurological: She is oriented to person, place, and time.  Skin: Skin is warm and dry. No rash noted. She is not diaphoretic. No erythema. No pallor.  Psychiatric: She has a normal mood and affect. Her behavior is normal. Judgment and thought content normal.  Vitals reviewed.   Lab Results  Component Value Date   WBC 9.3 03/11/2015   HGB 16.1* 03/11/2015   HCT 47.5* 03/11/2015  PLT 235.0 03/11/2015   GLUCOSE 94 03/11/2015   CHOL 143 03/11/2015   TRIG 174.0* 03/11/2015   HDL 40.90 03/11/2015   LDLCALC 67 03/11/2015   ALT 9 03/11/2015   AST 13 03/11/2015   NA 139 03/11/2015   K 4.2 03/11/2015   CL 104 03/11/2015   CREATININE 0.70 03/11/2015   BUN 10 03/11/2015   CO2 30 03/11/2015   TSH 2.56  03/11/2015    Dg Foot Complete Left  09/28/2014   CLINICAL DATA:  Puncture wound on the plantar aspect of the foot at the second MTP joint. Injury today. Initial encounter.  EXAM: LEFT FOOT - COMPLETE 3+ VIEW  COMPARISON:  None.  FINDINGS: No fracture or radiopaque foreign body. Alignment of the bones of the foot is anatomic.  IMPRESSION: Negative.   Electronically Signed   By: Andreas Newport M.D.   On: 09/28/2014 20:24    Assessment & Plan:   Analiyah was seen today for annual exam.  Diagnoses and all orders for this visit:  Routine general medical examination at a health care facility - she will let me know when she is ready to quit smoking (I recommend chantix), her PAP is UTD, vaccines were reviewed, labs ordered.  Orders: -     Lipid panel; Future -     Comprehensive metabolic panel; Future -     CBC with Differential/Platelet; Future -     TSH; Future  I have discontinued Ms. Siegfried's albendazole and fluconazole. I am also having her maintain her levonorgestrel.  No orders of the defined types were placed in this encounter.    See AVS for instructions about healthy living and anticipatory guidance. Follow-up: Return if symptoms worsen or fail to improve.  Sanda Linger, MD

## 2015-03-11 NOTE — Patient Instructions (Signed)
Preventive Care for Adults A healthy lifestyle and preventive care can promote health and wellness. Preventive health guidelines for women include the following key practices.  A routine yearly physical is a good way to check with your health care provider about your health and preventive screening. It is a chance to share any concerns and updates on your health and to receive a thorough exam.  Visit your dentist for a routine exam and preventive care every 6 months. Brush your teeth twice a day and floss once a day. Good oral hygiene prevents tooth decay and gum disease.  The frequency of eye exams is based on your age, health, family medical history, use of contact lenses, and other factors. Follow your health care provider's recommendations for frequency of eye exams.  Eat a healthy diet. Foods like vegetables, fruits, whole grains, low-fat dairy products, and lean protein foods contain the nutrients you need without too many calories. Decrease your intake of foods high in solid fats, added sugars, and salt. Eat the right amount of calories for you.Get information about a proper diet from your health care provider, if necessary.  Regular physical exercise is one of the most important things you can do for your health. Most adults should get at least 150 minutes of moderate-intensity exercise (any activity that increases your heart rate and causes you to sweat) each week. In addition, most adults need muscle-strengthening exercises on 2 or more days a week.  Maintain a healthy weight. The body mass index (BMI) is a screening tool to identify possible weight problems. It provides an estimate of body fat based on height and weight. Your health care provider can find your BMI and can help you achieve or maintain a healthy weight.For adults 20 years and older:  A BMI below 18.5 is considered underweight.  A BMI of 18.5 to 24.9 is normal.  A BMI of 25 to 29.9 is considered overweight.  A BMI of  30 and above is considered obese.  Maintain normal blood lipids and cholesterol levels by exercising and minimizing your intake of saturated fat. Eat a balanced diet with plenty of fruit and vegetables. Blood tests for lipids and cholesterol should begin at age 76 and be repeated every 5 years. If your lipid or cholesterol levels are high, you are over 50, or you are at high risk for heart disease, you may need your cholesterol levels checked more frequently.Ongoing high lipid and cholesterol levels should be treated with medicines if diet and exercise are not working.  If you smoke, find out from your health care provider how to quit. If you do not use tobacco, do not start.  Lung cancer screening is recommended for adults aged 22-80 years who are at high risk for developing lung cancer because of a history of smoking. A yearly low-dose CT scan of the lungs is recommended for people who have at least a 30-pack-year history of smoking and are a current smoker or have quit within the past 15 years. A pack year of smoking is smoking an average of 1 pack of cigarettes a day for 1 year (for example: 1 pack a day for 30 years or 2 packs a day for 15 years). Yearly screening should continue until the smoker has stopped smoking for at least 15 years. Yearly screening should be stopped for people who develop a health problem that would prevent them from having lung cancer treatment.  If you are pregnant, do not drink alcohol. If you are breastfeeding,  be very cautious about drinking alcohol. If you are not pregnant and choose to drink alcohol, do not have more than 1 drink per day. One drink is considered to be 12 ounces (355 mL) of beer, 5 ounces (148 mL) of wine, or 1.5 ounces (44 mL) of liquor.  Avoid use of street drugs. Do not share needles with anyone. Ask for help if you need support or instructions about stopping the use of drugs.  High blood pressure causes heart disease and increases the risk of  stroke. Your blood pressure should be checked at least every 1 to 2 years. Ongoing high blood pressure should be treated with medicines if weight loss and exercise do not work.  If you are 3-86 years old, ask your health care provider if you should take aspirin to prevent strokes.  Diabetes screening involves taking a blood sample to check your fasting blood sugar level. This should be done once every 3 years, after age 67, if you are within normal weight and without risk factors for diabetes. Testing should be considered at a younger age or be carried out more frequently if you are overweight and have at least 1 risk factor for diabetes.  Breast cancer screening is essential preventive care for women. You should practice "breast self-awareness." This means understanding the normal appearance and feel of your breasts and may include breast self-examination. Any changes detected, no matter how small, should be reported to a health care provider. Women in their 8s and 30s should have a clinical breast exam (CBE) by a health care provider as part of a regular health exam every 1 to 3 years. After age 70, women should have a CBE every year. Starting at age 25, women should consider having a mammogram (breast X-ray test) every year. Women who have a family history of breast cancer should talk to their health care provider about genetic screening. Women at a high risk of breast cancer should talk to their health care providers about having an MRI and a mammogram every year.  Breast cancer gene (BRCA)-related cancer risk assessment is recommended for women who have family members with BRCA-related cancers. BRCA-related cancers include breast, ovarian, tubal, and peritoneal cancers. Having family members with these cancers may be associated with an increased risk for harmful changes (mutations) in the breast cancer genes BRCA1 and BRCA2. Results of the assessment will determine the need for genetic counseling and  BRCA1 and BRCA2 testing.  Routine pelvic exams to screen for cancer are no longer recommended for nonpregnant women who are considered low risk for cancer of the pelvic organs (ovaries, uterus, and vagina) and who do not have symptoms. Ask your health care provider if a screening pelvic exam is right for you.  If you have had past treatment for cervical cancer or a condition that could lead to cancer, you need Pap tests and screening for cancer for at least 20 years after your treatment. If Pap tests have been discontinued, your risk factors (such as having a new sexual partner) need to be reassessed to determine if screening should be resumed. Some women have medical problems that increase the chance of getting cervical cancer. In these cases, your health care provider may recommend more frequent screening and Pap tests.  The HPV test is an additional test that may be used for cervical cancer screening. The HPV test looks for the virus that can cause the cell changes on the cervix. The cells collected during the Pap test can be  tested for HPV. The HPV test could be used to screen women aged 30 years and older, and should be used in women of any age who have unclear Pap test results. After the age of 30, women should have HPV testing at the same frequency as a Pap test.  Colorectal cancer can be detected and often prevented. Most routine colorectal cancer screening begins at the age of 50 years and continues through age 75 years. However, your health care provider may recommend screening at an earlier age if you have risk factors for colon cancer. On a yearly basis, your health care provider may provide home test kits to check for hidden blood in the stool. Use of a small camera at the end of a tube, to directly examine the colon (sigmoidoscopy or colonoscopy), can detect the earliest forms of colorectal cancer. Talk to your health care provider about this at age 50, when routine screening begins. Direct  exam of the colon should be repeated every 5-10 years through age 75 years, unless early forms of pre-cancerous polyps or small growths are found.  People who are at an increased risk for hepatitis B should be screened for this virus. You are considered at high risk for hepatitis B if:  You were born in a country where hepatitis B occurs often. Talk with your health care provider about which countries are considered high risk.  Your parents were born in a high-risk country and you have not received a shot to protect against hepatitis B (hepatitis B vaccine).  You have HIV or AIDS.  You use needles to inject street drugs.  You live with, or have sex with, someone who has hepatitis B.  You get hemodialysis treatment.  You take certain medicines for conditions like cancer, organ transplantation, and autoimmune conditions.  Hepatitis C blood testing is recommended for all people born from 1945 through 1965 and any individual with known risks for hepatitis C.  Practice safe sex. Use condoms and avoid high-risk sexual practices to reduce the spread of sexually transmitted infections (STIs). STIs include gonorrhea, chlamydia, syphilis, trichomonas, herpes, HPV, and human immunodeficiency virus (HIV). Herpes, HIV, and HPV are viral illnesses that have no cure. They can result in disability, cancer, and death.  You should be screened for sexually transmitted illnesses (STIs) including gonorrhea and chlamydia if:  You are sexually active and are younger than 24 years.  You are older than 24 years and your health care provider tells you that you are at risk for this type of infection.  Your sexual activity has changed since you were last screened and you are at an increased risk for chlamydia or gonorrhea. Ask your health care provider if you are at risk.  If you are at risk of being infected with HIV, it is recommended that you take a prescription medicine daily to prevent HIV infection. This is  called preexposure prophylaxis (PrEP). You are considered at risk if:  You are a heterosexual woman, are sexually active, and are at increased risk for HIV infection.  You take drugs by injection.  You are sexually active with a partner who has HIV.  Talk with your health care provider about whether you are at high risk of being infected with HIV. If you choose to begin PrEP, you should first be tested for HIV. You should then be tested every 3 months for as long as you are taking PrEP.  Osteoporosis is a disease in which the bones lose minerals and strength   with aging. This can result in serious bone fractures or breaks. The risk of osteoporosis can be identified using a bone density scan. Women ages 65 years and over and women at risk for fractures or osteoporosis should discuss screening with their health care providers. Ask your health care provider whether you should take a calcium supplement or vitamin D to reduce the rate of osteoporosis.  Menopause can be associated with physical symptoms and risks. Hormone replacement therapy is available to decrease symptoms and risks. You should talk to your health care provider about whether hormone replacement therapy is right for you.  Use sunscreen. Apply sunscreen liberally and repeatedly throughout the day. You should seek shade when your shadow is shorter than you. Protect yourself by wearing long sleeves, pants, a wide-brimmed hat, and sunglasses year round, whenever you are outdoors.  Once a month, do a whole body skin exam, using a mirror to look at the skin on your back. Tell your health care provider of new moles, moles that have irregular borders, moles that are larger than a pencil eraser, or moles that have changed in shape or color.  Stay current with required vaccines (immunizations).  Influenza vaccine. All adults should be immunized every year.  Tetanus, diphtheria, and acellular pertussis (Td, Tdap) vaccine. Pregnant women should  receive 1 dose of Tdap vaccine during each pregnancy. The dose should be obtained regardless of the length of time since the last dose. Immunization is preferred during the 27th-36th week of gestation. An adult who has not previously received Tdap or who does not know her vaccine status should receive 1 dose of Tdap. This initial dose should be followed by tetanus and diphtheria toxoids (Td) booster doses every 10 years. Adults with an unknown or incomplete history of completing a 3-dose immunization series with Td-containing vaccines should begin or complete a primary immunization series including a Tdap dose. Adults should receive a Td booster every 10 years.  Varicella vaccine. An adult without evidence of immunity to varicella should receive 2 doses or a second dose if she has previously received 1 dose. Pregnant females who do not have evidence of immunity should receive the first dose after pregnancy. This first dose should be obtained before leaving the health care facility. The second dose should be obtained 4-8 weeks after the first dose.  Human papillomavirus (HPV) vaccine. Females aged 13-26 years who have not received the vaccine previously should obtain the 3-dose series. The vaccine is not recommended for use in pregnant females. However, pregnancy testing is not needed before receiving a dose. If a female is found to be pregnant after receiving a dose, no treatment is needed. In that case, the remaining doses should be delayed until after the pregnancy. Immunization is recommended for any person with an immunocompromised condition through the age of 26 years if she did not get any or all doses earlier. During the 3-dose series, the second dose should be obtained 4-8 weeks after the first dose. The third dose should be obtained 24 weeks after the first dose and 16 weeks after the second dose.  Zoster vaccine. One dose is recommended for adults aged 60 years or older unless certain conditions are  present.  Measles, mumps, and rubella (MMR) vaccine. Adults born before 1957 generally are considered immune to measles and mumps. Adults born in 1957 or later should have 1 or more doses of MMR vaccine unless there is a contraindication to the vaccine or there is laboratory evidence of immunity to   each of the three diseases. A routine second dose of MMR vaccine should be obtained at least 28 days after the first dose for students attending postsecondary schools, health care workers, or international travelers. People who received inactivated measles vaccine or an unknown type of measles vaccine during 1963-1967 should receive 2 doses of MMR vaccine. People who received inactivated mumps vaccine or an unknown type of mumps vaccine before 1979 and are at high risk for mumps infection should consider immunization with 2 doses of MMR vaccine. For females of childbearing age, rubella immunity should be determined. If there is no evidence of immunity, females who are not pregnant should be vaccinated. If there is no evidence of immunity, females who are pregnant should delay immunization until after pregnancy. Unvaccinated health care workers born before 1957 who lack laboratory evidence of measles, mumps, or rubella immunity or laboratory confirmation of disease should consider measles and mumps immunization with 2 doses of MMR vaccine or rubella immunization with 1 dose of MMR vaccine.  Pneumococcal 13-valent conjugate (PCV13) vaccine. When indicated, a person who is uncertain of her immunization history and has no record of immunization should receive the PCV13 vaccine. An adult aged 19 years or older who has certain medical conditions and has not been previously immunized should receive 1 dose of PCV13 vaccine. This PCV13 should be followed with a dose of pneumococcal polysaccharide (PPSV23) vaccine. The PPSV23 vaccine dose should be obtained at least 8 weeks after the dose of PCV13 vaccine. An adult aged 19  years or older who has certain medical conditions and previously received 1 or more doses of PPSV23 vaccine should receive 1 dose of PCV13. The PCV13 vaccine dose should be obtained 1 or more years after the last PPSV23 vaccine dose.  Pneumococcal polysaccharide (PPSV23) vaccine. When PCV13 is also indicated, PCV13 should be obtained first. All adults aged 65 years and older should be immunized. An adult younger than age 65 years who has certain medical conditions should be immunized. Any person who resides in a nursing home or long-term care facility should be immunized. An adult smoker should be immunized. People with an immunocompromised condition and certain other conditions should receive both PCV13 and PPSV23 vaccines. People with human immunodeficiency virus (HIV) infection should be immunized as soon as possible after diagnosis. Immunization during chemotherapy or radiation therapy should be avoided. Routine use of PPSV23 vaccine is not recommended for American Indians, [place] Natives, or people younger than 65 years unless there are medical conditions that require PPSV23 vaccine. When indicated, people who have unknown immunization and have no record of immunization should receive PPSV23 vaccine. One-time revaccination 5 years after the first dose of PPSV23 is recommended for people aged 19-64 years who have chronic kidney failure, nephrotic syndrome, asplenia, or immunocompromised conditions. People who received 1-2 doses of PPSV23 before age 65 years should receive another dose of PPSV23 vaccine at age 65 years or later if at least 5 years have passed since the previous dose. Doses of PPSV23 are not needed for people immunized with PPSV23 at or after age 65 years.  Meningococcal vaccine. Adults with asplenia or persistent complement component deficiencies should receive 2 doses of quadrivalent meningococcal conjugate (MenACWY-D) vaccine. The doses should be obtained at least 2 months apart.  Microbiologists working with certain meningococcal bacteria, military recruits, people at risk during an outbreak, and people who travel to or live in countries with a high rate of meningitis should be immunized. A first-year college student up through age   21 years who is living in a residence hall should receive a dose if she did not receive a dose on or after her 16th birthday. Adults who have certain high-risk conditions should receive one or more doses of vaccine.  Hepatitis A vaccine. Adults who wish to be protected from this disease, have certain high-risk conditions, work with hepatitis A-infected animals, work in hepatitis A research labs, or travel to or work in countries with a high rate of hepatitis A should be immunized. Adults who were previously unvaccinated and who anticipate close contact with an international adoptee during the first 60 days after arrival in the [place] from a country with a high rate of hepatitis A should be immunized.  Hepatitis B vaccine. Adults who wish to be protected from this disease, have certain high-risk conditions, may be exposed to blood or other infectious body fluids, are household contacts or sex partners of hepatitis B positive people, are clients or workers in certain care facilities, or travel to or work in countries with a high rate of hepatitis B should be immunized.  Haemophilus influenzae type b (Hib) vaccine. A previously unvaccinated person with asplenia or sickle cell disease or having a scheduled splenectomy should receive 1 dose of Hib vaccine. Regardless of previous immunization, a recipient of a hematopoietic stem cell transplant should receive a 3-dose series 6-12 months after her successful transplant. Hib vaccine is not recommended for adults with HIV infection. Preventive Services / Frequency Ages 64 to 68 years  Blood pressure check.** / Every 1 to 2 years.  Lipid and cholesterol check.** / Every 5 years beginning at age  22.  Clinical breast exam.** / Every 3 years for women in their 88s and 53s.  BRCA-related cancer risk assessment.** / For women who have family members with a BRCA-related cancer (breast, ovarian, tubal, or peritoneal cancers).  Pap test.** / Every 2 years from ages 90 through 51. Every 3 years starting at age 21 through age 56 or 3 with a history of 3 consecutive normal Pap tests.  HPV screening.** / Every 3 years from ages 24 through ages 1 to 46 with a history of 3 consecutive normal Pap tests.  Hepatitis C blood test.** / For any individual with known risks for hepatitis C.  Skin self-exam. / Monthly.  Influenza vaccine. / Every year.  Tetanus, diphtheria, and acellular pertussis (Tdap, Td) vaccine.** / Consult your health care provider. Pregnant women should receive 1 dose of Tdap vaccine during each pregnancy. 1 dose of Td every 10 years.  Varicella vaccine.** / Consult your health care provider. Pregnant females who do not have evidence of immunity should receive the first dose after pregnancy.  HPV vaccine. / 3 doses over 6 months, if 72 and younger. The vaccine is not recommended for use in pregnant females. However, pregnancy testing is not needed before receiving a dose.  Measles, mumps, rubella (MMR) vaccine.** / You need at least 1 dose of MMR if you were born in 1957 or later. You may also need a 2nd dose. For females of childbearing age, rubella immunity should be determined. If there is no evidence of immunity, females who are not pregnant should be vaccinated. If there is no evidence of immunity, females who are pregnant should delay immunization until after pregnancy.  Pneumococcal 13-valent conjugate (PCV13) vaccine.** / Consult your health care provider.  Pneumococcal polysaccharide (PPSV23) vaccine.** / 1 to 2 doses if you smoke cigarettes or if you have certain conditions.  Meningococcal vaccine.** /  1 dose if you are age 19 to 21 years and a first-year college  student living in a residence hall, or have one of several medical conditions, you need to get vaccinated against meningococcal disease. You may also need additional booster doses.  Hepatitis A vaccine.** / Consult your health care provider.  Hepatitis B vaccine.** / Consult your health care provider.  Haemophilus influenzae type b (Hib) vaccine.** / Consult your health care provider. Ages 40 to 64 years  Blood pressure check.** / Every 1 to 2 years.  Lipid and cholesterol check.** / Every 5 years beginning at age 20 years.  Lung cancer screening. / Every year if you are aged 55-80 years and have a 30-pack-year history of smoking and currently smoke or have quit within the past 15 years. Yearly screening is stopped once you have quit smoking for at least 15 years or develop a health problem that would prevent you from having lung cancer treatment.  Clinical breast exam.** / Every year after age 40 years.  BRCA-related cancer risk assessment.** / For women who have family members with a BRCA-related cancer (breast, ovarian, tubal, or peritoneal cancers).  Mammogram.** / Every year beginning at age 40 years and continuing for as long as you are in good health. Consult with your health care provider.  Pap test.** / Every 3 years starting at age 30 years through age 65 or 70 years with a history of 3 consecutive normal Pap tests.  HPV screening.** / Every 3 years from ages 30 years through ages 65 to 70 years with a history of 3 consecutive normal Pap tests.  Fecal occult blood test (FOBT) of stool. / Every year beginning at age 50 years and continuing until age 75 years. You may not need to do this test if you get a colonoscopy every 10 years.  Flexible sigmoidoscopy or colonoscopy.** / Every 5 years for a flexible sigmoidoscopy or every 10 years for a colonoscopy beginning at age 50 years and continuing until age 75 years.  Hepatitis C blood test.** / For all people born from 1945 through  1965 and any individual with known risks for hepatitis C.  Skin self-exam. / Monthly.  Influenza vaccine. / Every year.  Tetanus, diphtheria, and acellular pertussis (Tdap/Td) vaccine.** / Consult your health care provider. Pregnant women should receive 1 dose of Tdap vaccine during each pregnancy. 1 dose of Td every 10 years.  Varicella vaccine.** / Consult your health care provider. Pregnant females who do not have evidence of immunity should receive the first dose after pregnancy.  Zoster vaccine.** / 1 dose for adults aged 60 years or older.  Measles, mumps, rubella (MMR) vaccine.** / You need at least 1 dose of MMR if you were born in 1957 or later. You may also need a 2nd dose. For females of childbearing age, rubella immunity should be determined. If there is no evidence of immunity, females who are not pregnant should be vaccinated. If there is no evidence of immunity, females who are pregnant should delay immunization until after pregnancy.  Pneumococcal 13-valent conjugate (PCV13) vaccine.** / Consult your health care provider.  Pneumococcal polysaccharide (PPSV23) vaccine.** / 1 to 2 doses if you smoke cigarettes or if you have certain conditions.  Meningococcal vaccine.** / Consult your health care provider.  Hepatitis A vaccine.** / Consult your health care provider.  Hepatitis B vaccine.** / Consult your health care provider.  Haemophilus influenzae type b (Hib) vaccine.** / Consult your health care provider. Ages 65   years and over  Blood pressure check.** / Every 1 to 2 years.  Lipid and cholesterol check.** / Every 5 years beginning at age 22 years.  Lung cancer screening. / Every year if you are aged 73-80 years and have a 30-pack-year history of smoking and currently smoke or have quit within the past 15 years. Yearly screening is stopped once you have quit smoking for at least 15 years or develop a health problem that would prevent you from having lung cancer  treatment.  Clinical breast exam.** / Every year after age 4 years.  BRCA-related cancer risk assessment.** / For women who have family members with a BRCA-related cancer (breast, ovarian, tubal, or peritoneal cancers).  Mammogram.** / Every year beginning at age 40 years and continuing for as long as you are in good health. Consult with your health care provider.  Pap test.** / Every 3 years starting at age 9 years through age 34 or 91 years with 3 consecutive normal Pap tests. Testing can be stopped between 65 and 70 years with 3 consecutive normal Pap tests and no abnormal Pap or HPV tests in the past 10 years.  HPV screening.** / Every 3 years from ages 57 years through ages 64 or 45 years with a history of 3 consecutive normal Pap tests. Testing can be stopped between 65 and 70 years with 3 consecutive normal Pap tests and no abnormal Pap or HPV tests in the past 10 years.  Fecal occult blood test (FOBT) of stool. / Every year beginning at age 15 years and continuing until age 17 years. You may not need to do this test if you get a colonoscopy every 10 years.  Flexible sigmoidoscopy or colonoscopy.** / Every 5 years for a flexible sigmoidoscopy or every 10 years for a colonoscopy beginning at age 86 years and continuing until age 71 years.  Hepatitis C blood test.** / For all people born from [birth year] through 1965 and any individual with known risks for hepatitis C.  Osteoporosis screening.** / A one-time screening for women ages 83 years and over and women at risk for fractures or osteoporosis.  Skin self-exam. / Monthly.  Influenza vaccine. / Every year.  Tetanus, diphtheria, and acellular pertussis (Tdap/Td) vaccine.** / 1 dose of Td every 10 years.  Varicella vaccine.** / Consult your health care provider.  Zoster vaccine.** / 1 dose for adults aged 61 years or older.  Pneumococcal 13-valent conjugate (PCV13) vaccine.** / Consult your health care provider.  Pneumococcal  polysaccharide (PPSV23) vaccine.** / 1 dose for all adults aged 28 years and older.  Meningococcal vaccine.** / Consult your health care provider.  Hepatitis A vaccine.** / Consult your health care provider.  Hepatitis B vaccine.** / Consult your health care provider.  Haemophilus influenzae type b (Hib) vaccine.** / Consult your health care provider. ** Family history and personal history of risk and conditions may change your health care provider's recommendations. Document Released: 11/10/2001 Document Revised: 01/29/2014 Document Reviewed: 02/09/2011 [facility] Patient Information 2015 [place], [place]. This information is not intended to replace advice given to you by your health care provider. Make sure you discuss any questions you have with your health care provider.

## 2015-03-12 ENCOUNTER — Encounter: Payer: Self-pay | Admitting: Internal Medicine

## 2015-03-17 ENCOUNTER — Encounter: Payer: Self-pay | Admitting: Internal Medicine

## 2015-03-25 ENCOUNTER — Telehealth: Payer: Self-pay | Admitting: Internal Medicine

## 2015-03-25 ENCOUNTER — Other Ambulatory Visit: Payer: Self-pay | Admitting: Internal Medicine

## 2015-03-25 DIAGNOSIS — F17200 Nicotine dependence, unspecified, uncomplicated: Secondary | ICD-10-CM

## 2015-03-25 MED ORDER — VARENICLINE TARTRATE 1 MG PO TABS: 1.0000 mg | ORAL_TABLET | Freq: Two times a day (BID) | ORAL | Status: DC

## 2015-03-25 MED ORDER — VARENICLINE TARTRATE 0.5 MG X 11 & 1 MG X 42 PO MISC: ORAL | Status: DC

## 2015-03-25 NOTE — Telephone Encounter (Signed)
Please read message below and advise.  

## 2015-03-25 NOTE — Telephone Encounter (Signed)
done

## 2015-03-25 NOTE — Telephone Encounter (Signed)
Patients states she spoke with Dr.Jones about wanting to stop smoking.  Patient would like script for chantix to be sent to [facility] on [address].

## 2015-03-25 NOTE — Telephone Encounter (Signed)
Called pt and advised her that Dr Yetta Barre has an rx for Chantix for pt along with a co-pay savings card. Advised pt to come pick up. Pt stated she would.

## 2015-04-09 ENCOUNTER — Encounter: Payer: Self-pay | Admitting: Family Medicine

## 2015-04-09 ENCOUNTER — Ambulatory Visit (INDEPENDENT_AMBULATORY_CARE_PROVIDER_SITE_OTHER): Payer: BLUE CROSS/BLUE SHIELD | Admitting: Family Medicine

## 2015-04-09 VITALS — BP 122/82 | HR 100 | Ht 65.0 in | Wt 166.0 lb

## 2015-04-09 DIAGNOSIS — M9904 Segmental and somatic dysfunction of sacral region: Secondary | ICD-10-CM | POA: Diagnosis not present

## 2015-04-09 DIAGNOSIS — M9902 Segmental and somatic dysfunction of thoracic region: Secondary | ICD-10-CM

## 2015-04-09 DIAGNOSIS — M545 Low back pain, unspecified: Secondary | ICD-10-CM | POA: Insufficient documentation

## 2015-04-09 DIAGNOSIS — M9903 Segmental and somatic dysfunction of lumbar region: Secondary | ICD-10-CM

## 2015-04-09 DIAGNOSIS — M999 Biomechanical lesion, unspecified: Secondary | ICD-10-CM | POA: Insufficient documentation

## 2015-04-09 NOTE — Progress Notes (Signed)
Pre visit review using our clinic review tool, if applicable. No additional management support is needed unless otherwise documented below in the visit note. 

## 2015-04-09 NOTE — Assessment & Plan Note (Signed)
Decision today to treat with OMT was based on Physical Exam  After verbal consent patient was treated with HVLA, ME techniques in Cervical, thoracic, lumbnar and sacral areas  Patient tolerated the procedure well with improvement in symptoms  Patient given exercises, stretches and lifestyle modifications  See medications in patient instructions if given  Patient will follow up in 4 weeks

## 2015-04-09 NOTE — Assessment & Plan Note (Signed)
Patient pain multifactorial with muscle imbalances HEP, Icing, OTC anti inflammtories Worked with ATC RTC in 4 weeks.

## 2015-04-09 NOTE — Patient Instructions (Signed)
Good to see you Sorry about the hair Ice 20 minutes 2 times daily. Usually after activity and before bed. Exercises 3 times a week.  Focus on core some Vitamin D 2000 IU daily Turmeric  twice daily Choline  daily for memory See me again in 4 weeks.

## 2015-04-09 NOTE — Progress Notes (Signed)
[name] [address] Phone: [phone] Subjective:    I'm seeing this patient by the request  of:  Sanda Linger, MD   CC: Back pain  [id number] Laura Guerra is a 29 y.o. female coming in with complaint of Back pain. Been going on for many months. Patient does have some intermittent pain noted. Patient does have some tightness of the muscles she states of the lower back. Patient states that it does seem to be secondary to some anxiety as well. Patient does not remove or any true injury but does do a lot of lifting at work. Patient scrubs pain as more of a dull, throbbing aching sensation without any radicular symptom down the leg. Patient states that she feels tight. Feels better when she does more activity and worse when she is lazy. Rates the severity of pain is 4 out of 10. Denies any nighttime awakening. No specific injury. Does respond somewhat anti-inflammatory's when needed.  Past Medical History  Diagnosis Date  . No pertinent past medical history   . Seasonal depression    Past Surgical History  Procedure Laterality Date  . No past surgeries    . Wisdom tooth extraction     History  Substance Use Topics  . Smoking status: Current Every Day Smoker -- 1.00 packs/day for 20 years    Types: Cigarettes  . Smokeless tobacco: Never Used  . Alcohol Use: No     Comment: occasionally   No Known Allergies Family History  Problem Relation Age of Onset  . Cancer Father   . Cancer Maternal Grandmother         Past medical history, social, surgical and family history all reviewed in electronic medical record.   Review of Systems: No headache, visual changes, nausea, vomiting, diarrhea, constipation, dizziness, abdominal pain, skin rash, fevers, chills, night sweats, weight loss, swollen lymph nodes, body aches, joint swelling, muscle aches, chest pain, shortness of breath, mood changes.   Objective Blood pressure 122/82,  pulse 100, height  (1.651 m), weight 166 lb (75.297 kg), last menstrual period 01/09/2015, SpO2 98 %.  General: No apparent distress alert and oriented x3 mood and affect normal, dressed appropriately.  HEENT: Pupils equal, extraocular movements intact  Respiratory: Patient's speak in full sentences and does not appear short of breath  Cardiovascular: No lower extremity edema, non tender, no erythema  Skin: Warm dry intact with no signs of infection or rash on extremities or on axial skeleton.  Abdomen: Soft nontender  Neuro: Cranial nerves II through XII are intact, neurovascularly intact in all extremities with 2+ DTRs and 2+ pulses.  Lymph: No lymphadenopathy of posterior or anterior cervical chain or axillae bilaterally.  Gait normal with good balance and coordination.  MSK:  Non tender with full range of motion and good stability and symmetric strength and tone of shoulders, elbows, wrist, hip, knee and ankles bilaterally.  Back Exam:  Inspection: Unremarkable  Motion: Flexion 35 deg, Extension 25 deg, Side Bending to 25 deg bilaterally,  Rotation to 45 deg bilaterally  SLR laying: Negative  XSLR laying: Negative  Palpable tenderness: Tenderness of the paraspinal musculature lumbar spine on the left side FABER: negative. Sensory change: Gross sensation intact to all lumbar and sacral dermatomes.  Reflexes: 2+ at both patellar tendons, 2+ at achilles tendons, Babinski's downgoing.  Strength at foot  Plantar-flexion: 5/5 Dorsi-flexion: 5/5 Eversion: 4/5 Inversion: 5/5  Leg strength  Quad: 5/5 Hamstring: 5/5 Hip  flexor: 4/5 Hip abductors: 3/5  Gait unremarkable.  Osteopathic findings C2 flexed rotated and side bent right T1 extended rotated and side bent left with elevated first rib T5 E RS RIGHT L2 F RS left Sacrum right on right     Procedure 97110; 15 minutes spent for Therapeutic exercises as stated in above notes.  This included exercises focusing on stretching,  strengthening, with significant focus on eccentric aspects.  - Low back exercises that included:  Pelvic tilt/bracing instruction to focus on control of the pelvic girdle and lower abdominal muscles  Glute strengthening exercises, focusing on proper firing of the glutes without engaging the low back muscles Proper stretching techniques for maximum relief for the hamstrings, hip flexors, low back and some rotation where tolerated Proper technique shown and discussed handout in great detail with ATC.  All questions were discussed and answered.     Impression and Recommendations:     This case required medical decision making of moderate complexity.

## 2015-05-07 ENCOUNTER — Ambulatory Visit (INDEPENDENT_AMBULATORY_CARE_PROVIDER_SITE_OTHER): Payer: BLUE CROSS/BLUE SHIELD | Admitting: Family Medicine

## 2015-05-07 ENCOUNTER — Encounter: Payer: Self-pay | Admitting: Family Medicine

## 2015-05-07 VITALS — BP 106/80 | HR 101 | Ht 65.0 in | Wt 171.0 lb

## 2015-05-07 DIAGNOSIS — M545 Low back pain, unspecified: Secondary | ICD-10-CM

## 2015-05-07 DIAGNOSIS — M9903 Segmental and somatic dysfunction of lumbar region: Secondary | ICD-10-CM

## 2015-05-07 DIAGNOSIS — M999 Biomechanical lesion, unspecified: Secondary | ICD-10-CM

## 2015-05-07 MED ORDER — CYCLOBENZAPRINE HCL 10 MG PO TABS: 10.0000 mg | ORAL_TABLET | Freq: Every day | ORAL | Status: DC

## 2015-05-07 NOTE — Assessment & Plan Note (Signed)
Continues to be somewhat of a difficult he. Patient has been somewhat noncompliant at this time. Discuss that I would encourage her to do the exercises on a more regular basis. Patient will do 3 day burst of high-dose anti-inflammatory's. We discussed icing regimen and doing the over-the-counter natural supple mentation's. Patient was given more exercises as well as a refracture course with athletic trainer today. Patient will come back and see me again in 3 weeks for further evaluation of major she is responding. Axert given for nighttime relief.

## 2015-05-07 NOTE — Progress Notes (Signed)
Pre visit review using our clinic review tool, if applicable. No additional management support is needed unless otherwise documented below in the visit note. 

## 2015-05-07 NOTE — Assessment & Plan Note (Signed)
Decision today to treat with OMT was based on Physical Exam  After verbal consent patient was treated with HVLA, ME techniques in Cervical, thoracic, lumbnar and sacral areas  Patient tolerated the procedure well with improvement in symptoms  Patient given exercises, stretches and lifestyle modifications  See medications in patient instructions if given  Patient will follow up in 4 weeks  

## 2015-05-07 NOTE — Progress Notes (Signed)
[name] [address] Phone: [phone] Subjective:    I'm seeing this patient by the request  of:  Sanda Linger, MD   CC: Back pain follow-up  [id number] Laura Guerra is a 29 y.o. female coming in with complaint of Back pain. Patient was seen previously and was diagnosed with more of a muscle imbalances and tight hip flexors. We discussed proper lifting techniques as well as home exercises and over-the-counter natural supple mentation's. Patient did respond fairly well to osteopathic manipulation. Patient states she has made some mild improvement. Not doing the exercises on a regular basis. Did not get any vitamins that was suggested. Patient states that still some mild discomfort as well as some leg pain from time to time. Patient states though that she just feels like herself. States that it is very difficult to sleep secondary to the discomfort.  Past Medical History  Diagnosis Date  . No pertinent past medical history   . Seasonal depression    Past Surgical History  Procedure Laterality Date  . No past surgeries    . Wisdom tooth extraction     History  Substance Use Topics  . Smoking status: Current Every Day Smoker -- 1.00 packs/day for 20 years    Types: Cigarettes  . Smokeless tobacco: Never Used  . Alcohol Use: No     Comment: occasionally   No Known Allergies Family History  Problem Relation Age of Onset  . Cancer Father   . Cancer Maternal Grandmother         Past medical history, social, surgical and family history all reviewed in electronic medical record.   Review of Systems: No headache, visual changes, nausea, vomiting, diarrhea, constipation, dizziness, abdominal pain, skin rash, fevers, chills, night sweats, weight loss, swollen lymph nodes, body aches, joint swelling, muscle aches, chest pain, shortness of breath, mood changes.   Objective Blood pressure 106/80, pulse 101, height  (1.651  m), weight 171 lb (77.565 kg), SpO2 97 %.  General: No apparent distress alert and oriented x3 mood and affect normal, dressed appropriately.  HEENT: Pupils equal, extraocular movements intact  Respiratory: Patient's speak in full sentences and does not appear short of breath  Cardiovascular: No lower extremity edema, non tender, no erythema  Skin: Warm dry intact with no signs of infection or rash on extremities or on axial skeleton.  Abdomen: Soft nontender  Neuro: Cranial nerves II through XII are intact, neurovascularly intact in all extremities with 2+ DTRs and 2+ pulses.  Lymph: No lymphadenopathy of posterior or anterior cervical chain or axillae bilaterally.  Gait normal with good balance and coordination.  MSK:  Non tender with full range of motion and good stability and symmetric strength and tone of shoulders, elbows, wrist, hip, knee and ankles bilaterally.  Back Exam:  Inspection: Unremarkable  Motion: Flexion 35 deg, Extension 25 deg, Side Bending to 25 deg bilaterally,  Rotation to 45 deg bilaterally  SLR laying: Negative  XSLR laying: Negative  Palpable tenderness: Continued tenderness of the paraspinal musculature FABER: Positive right side Sensory change: Gross sensation intact to all lumbar and sacral dermatomes.  Reflexes: 2+ at both patellar tendons, 2+ at achilles tendons, Babinski's downgoing.  Strength at foot  Plantar-flexion: 5/5 Dorsi-flexion: 5/5 Eversion: 4/5 Inversion: 5/5  Leg strength  Quad: 5/5 Hamstring: 5/5 Hip flexor: 4/5 Hip abductors: 3++/5  Gait unremarkable.  Osteopathic findings C2 flexed rotated and side bent right T1 extended rotated and  side bent left with elevated first rib T5 E RS RIGHT L2 F RS left Sacrum right on right        Impression and Recommendations:     This case required medical decision making of moderate complexity.

## 2015-05-07 NOTE — Patient Instructions (Addendum)
Good to see you You are doing great!!!! Try to do the exercises a little more frequently Stand on wall with heels butt shoulder and head touching for a goal of 5 minutes daily Better shoes at all times Take medicine 1 pill 3 times a day for 3 days Flexeril 1/2 tab at night if trouble sleeping Arnica lotion or gel is over the counter See me again in 3-4 weeks.

## 2015-05-30 ENCOUNTER — Ambulatory Visit (INDEPENDENT_AMBULATORY_CARE_PROVIDER_SITE_OTHER)
Admission: RE | Admit: 2015-05-30 | Discharge: 2015-05-30 | Disposition: A | Discharge status: Home / Self Care | Payer: BLUE CROSS/BLUE SHIELD | Source: Ambulatory Visit | Attending: Internal Medicine | Admitting: Internal Medicine

## 2015-05-30 ENCOUNTER — Ambulatory Visit (INDEPENDENT_AMBULATORY_CARE_PROVIDER_SITE_OTHER): Payer: BLUE CROSS/BLUE SHIELD | Admitting: Internal Medicine

## 2015-05-30 ENCOUNTER — Other Ambulatory Visit (INDEPENDENT_AMBULATORY_CARE_PROVIDER_SITE_OTHER): Payer: BLUE CROSS/BLUE SHIELD

## 2015-05-30 ENCOUNTER — Encounter: Payer: Self-pay | Admitting: Internal Medicine

## 2015-05-30 VITALS — BP 114/74 | HR 73 | Temp 98.4°F | Ht 65.0 in | Wt 171.0 lb

## 2015-05-30 DIAGNOSIS — M549 Dorsalgia, unspecified: Secondary | ICD-10-CM

## 2015-05-30 DIAGNOSIS — M546 Pain in thoracic spine: Secondary | ICD-10-CM

## 2015-05-30 DIAGNOSIS — R0789 Other chest pain: Secondary | ICD-10-CM | POA: Diagnosis not present

## 2015-05-30 DIAGNOSIS — D751 Secondary polycythemia: Secondary | ICD-10-CM

## 2015-05-30 DIAGNOSIS — Z0189 Encounter for other specified special examinations: Secondary | ICD-10-CM | POA: Diagnosis not present

## 2015-05-30 DIAGNOSIS — Z0000 Encounter for general adult medical examination without abnormal findings: Secondary | ICD-10-CM

## 2015-05-30 DIAGNOSIS — F17200 Nicotine dependence, unspecified, uncomplicated: Secondary | ICD-10-CM | POA: Insufficient documentation

## 2015-05-30 DIAGNOSIS — Z720 Tobacco use: Secondary | ICD-10-CM

## 2015-05-30 LAB — HEPATIC FUNCTION PANEL
ALK PHOS: 50 U/L (ref 39–117)
ALT: 8 U/L (ref 0–35)
AST: 11 U/L (ref 0–37)
Albumin: 4.4 g/dL (ref 3.5–5.2)
BILIRUBIN DIRECT: 0.3 mg/dL (ref 0.0–0.3)
BILIRUBIN TOTAL: 1.8 mg/dL — AB (ref 0.2–1.2)
Total Protein: 6.7 g/dL (ref 6.0–8.3)

## 2015-05-30 LAB — CBC WITH DIFFERENTIAL/PLATELET
BASOS PCT: 0.5 % (ref 0.0–3.0)
Basophils Absolute: 0 10*3/uL (ref 0.0–0.1)
EOS PCT: 3.5 % (ref 0.0–5.0)
Eosinophils Absolute: 0.3 10*3/uL (ref 0.0–0.7)
HCT: 44.3 % (ref 36.0–46.0)
Hemoglobin: 15 g/dL (ref 12.0–15.0)
LYMPHS ABS: 2.3 10*3/uL (ref 0.7–4.0)
Lymphocytes Relative: 31.2 % (ref 12.0–46.0)
MCHC: 33.8 g/dL (ref 30.0–36.0)
MCV: 89.2 fl (ref 78.0–100.0)
MONO ABS: 0.7 10*3/uL (ref 0.1–1.0)
MONOS PCT: 9.9 % (ref 3.0–12.0)
NEUTROS PCT: 54.9 % (ref 43.0–77.0)
Neutro Abs: 4 10*3/uL (ref 1.4–7.7)
Platelets: 229 10*3/uL (ref 150.0–400.0)
RBC: 4.97 Mil/uL (ref 3.87–5.11)
RDW: 13.3 % (ref 11.5–15.5)
WBC: 7.3 10*3/uL (ref 4.0–10.5)

## 2015-05-30 LAB — BASIC METABOLIC PANEL
BUN: 12 mg/dL (ref 6–23)
CO2: 28 mEq/L (ref 19–32)
Calcium: 9.1 mg/dL (ref 8.4–10.5)
Chloride: 107 mEq/L (ref 96–112)
Creatinine, Ser: 0.72 mg/dL (ref 0.40–1.20)
GFR: 101.6 mL/min (ref >60.00)
Glucose, Bld: 70 mg/dL (ref 70–99)
POTASSIUM: 4.2 meq/L (ref 3.5–5.1)
SODIUM: 141 meq/L (ref 135–145)

## 2015-05-30 MED ORDER — CYCLOBENZAPRINE HCL 5 MG PO TABS
5.0000 mg | ORAL_TABLET | Freq: Three times a day (TID) | ORAL | Status: DC | PRN
Start: 2015-05-30 — End: 2015-10-08

## 2015-05-30 MED ORDER — DICLOFENAC SODIUM 75 MG PO TBEC: 75.0000 mg | DELAYED_RELEASE_TABLET | Freq: Two times a day (BID) | ORAL | Status: DC

## 2015-05-30 NOTE — Patient Instructions (Signed)
Your EKG was OK today  Please take all new medication as prescribed - the anti-inflammatory and muscle relaxer as needed for pain  Please continue all other medications as before, and refills have been done if requested.  Please have the pharmacy call with any other refills you may need.  Please keep your appointments with your specialists as you may have planned  You will be contacted regarding the referral for: GYN  Please go to the LAB in the Basement (turn left off the elevator) for the tests to be done today  You will be contacted by phone if any changes need to be made immediately.  Otherwise, you will receive a letter about your results with an explanation, but please check with MyChart first.  Please remember to sign up for MyChart if you have not done so, as this will be important to you in the future with finding out test results, communicating by private email, and scheduling acute appointments online when needed.

## 2015-05-30 NOTE — Assessment & Plan Note (Signed)
I suspect most c/w MSK strain, though not obvious cause.  Neuro exam ok, no neck pain.  OK for nsaid prn, muscle relaxer prn, . to f/u any worsening symptoms or concerns

## 2015-05-30 NOTE — Progress Notes (Signed)
Subjective:    Patient ID: Laura Guerra, female    DOB: 27-Jun-1986, 29 y.o.   MRN: [id number]  HPI Here with acute visit for onset 1-2 days left upper back pain, tender to touch the area and worse to move the shoulder, overall mild but occas severe, intermittent, with some radiation to the left anterior upper chest as well, + pleuritic today but o/w nonexertional, nonpositional. No assoc n/v, palps, dizziness, diaphoresis.  Has ongoing tendency to anxiety but Denies worsening depressive symptoms, suicidal ideation, or panic.  Mostly concerned about her long hx of tobacco use since 29 yo.  Pt denies chest pain, increased sob or doe, wheezing, orthopnea, PND, increased LE swelling, palpitations, dizziness or syncope other than that above.  Pt denies new neurological symptoms such as new headache, or facial or extremity weakness or numbness and No neck pain.  No fever, cough, ST.  No recent heavy lifiting or unsusual activity.  Most recent lab June 2016 with mild polycythemia. Asks for GYN referral for routine care. Past Medical History  Diagnosis Date  . No pertinent past medical history   . Seasonal depression    Past Surgical History  Procedure Laterality Date  . No past surgeries    . Wisdom tooth extraction      reports that she has been smoking Cigarettes.  She has a 20 pack-year smoking history. She has never used smokeless tobacco. She reports that she does not drink alcohol or use illicit drugs. family history includes Cancer in her father and maternal grandmother. No Known Allergies Current Outpatient Prescriptions on File Prior to Visit  Medication Sig Dispense Refill  . levonorgestrel (MIRENA) 20 MCG/24HR IUD 1 each by Intrauterine route once.    . varenicline (CHANTIX CONTINUING MONTH PAK) 1 MG tablet Take 1 tablet (1 mg total) by mouth 2 (two) times daily. (Patient not taking: Reported on 05/30/2015) 60 tablet 3  . varenicline (CHANTIX STARTING MONTH PAK) 0.5 MG X 11 & 1 MG X  42 tablet Take one 0.5 mg tablet by mouth once daily for 3 days, then increase to one 0.5 mg tablet twice daily for 4 days, then increase to one 1 mg tablet twice daily. (Patient not taking: Reported on 05/30/2015) 53 tablet 0   No current facility-administered medications on file prior to visit.   Review of Systems   Constitutional: Negative for unusual diaphoresis or night sweats HENT: Negative for ringing in ear or discharge Eyes: Negative for double vision or worsening visual disturbance.  Respiratory: Negative for choking and stridor.   Gastrointestinal: Negative for vomiting or other signifcant bowel change Genitourinary: Negative for hematuria or change in urine volume.  Musculoskeletal: Negative for other MSK pain or swelling Skin: Negative for color change and worsening wound.  Neurological: Negative for tremors and numbness other than noted  Psychiatric/Behavioral: Negative for decreased concentration or agitation other than above       Objective:   Physical Exam BP 114/74 mmHg  Pulse 73  Temp(Src) 98.4 F (36.9 C) (Oral)  Ht  (1.651 m)  Wt 171 lb (77.565 kg)  BMI 28.46 kg/m2  SpO2 98% VS noted,  Constitutional: Pt appears in no significant distress HENT: Head: NCAT.  Right Ear: External ear normal.  Left Ear: External ear normal.  Eyes: . Pupils are equal, round, and reactive to light. Conjunctivae and EOM are normal Neck: Normal range of motion. Neck supple.  Cardiovascular: Normal rate and regular rhythm.   Pulmonary/Chest: Effort normal and  breath sounds without rales or wheezing.  Abd:  Soft, NT, ND, + BS MSK:  Tender left trapezoid area without swelling, rash or skin change Neurological: Pt is alert. Not confused , motor grossly intact Skin: Skin is warm. No rash, no LE edema Psychiatric: Pt behavior is normal. No agitation. mild nervous    Assessment & Plan:

## 2015-05-30 NOTE — Assessment & Plan Note (Signed)
Urged to quit 

## 2015-05-30 NOTE — Assessment & Plan Note (Addendum)
ECG reviewed as per emr - no evidence for pericarditis, o/w atypical, will have CXR due to the pleuritic type nature, but suspect is referred pain from upper back

## 2015-05-30 NOTE — Progress Notes (Signed)
Pre visit review using our clinic review tool, if applicable. No additional management support is needed unless otherwise documented below in the visit note. 

## 2015-05-30 NOTE — Assessment & Plan Note (Signed)
?   Clinical significance, ok for repeat cbc, urged to quit smoking,  to f/u any worsening symptoms or concerns

## 2015-06-05 ENCOUNTER — Ambulatory Visit: Payer: BLUE CROSS/BLUE SHIELD | Admitting: Family Medicine

## 2015-07-05 ENCOUNTER — Encounter: Payer: Self-pay | Admitting: Internal Medicine

## 2015-10-08 ENCOUNTER — Encounter: Payer: Self-pay | Admitting: Internal Medicine

## 2015-10-08 ENCOUNTER — Ambulatory Visit (INDEPENDENT_AMBULATORY_CARE_PROVIDER_SITE_OTHER): Payer: BLUE CROSS/BLUE SHIELD | Admitting: Family Medicine

## 2015-10-08 ENCOUNTER — Encounter: Payer: Self-pay | Admitting: Family Medicine

## 2015-10-08 ENCOUNTER — Ambulatory Visit (INDEPENDENT_AMBULATORY_CARE_PROVIDER_SITE_OTHER): Payer: BLUE CROSS/BLUE SHIELD | Admitting: Internal Medicine

## 2015-10-08 VITALS — BP 100/80 | HR 74 | Temp 98.0°F | Resp 16 | Ht 65.0 in | Wt 180.0 lb

## 2015-10-08 VITALS — BP 104/72 | HR 86 | Ht 65.0 in | Wt 179.0 lb

## 2015-10-08 DIAGNOSIS — J302 Other seasonal allergic rhinitis: Secondary | ICD-10-CM | POA: Diagnosis not present

## 2015-10-08 DIAGNOSIS — F329 Major depressive disorder, single episode, unspecified: Secondary | ICD-10-CM | POA: Diagnosis not present

## 2015-10-08 DIAGNOSIS — M9901 Segmental and somatic dysfunction of cervical region: Secondary | ICD-10-CM

## 2015-10-08 DIAGNOSIS — F450 Somatization disorder: Secondary | ICD-10-CM | POA: Diagnosis not present

## 2015-10-08 DIAGNOSIS — M9903 Segmental and somatic dysfunction of lumbar region: Secondary | ICD-10-CM | POA: Diagnosis not present

## 2015-10-08 DIAGNOSIS — J309 Allergic rhinitis, unspecified: Secondary | ICD-10-CM | POA: Insufficient documentation

## 2015-10-08 DIAGNOSIS — M9902 Segmental and somatic dysfunction of thoracic region: Secondary | ICD-10-CM

## 2015-10-08 DIAGNOSIS — M549 Dorsalgia, unspecified: Secondary | ICD-10-CM

## 2015-10-08 DIAGNOSIS — M999 Biomechanical lesion, unspecified: Secondary | ICD-10-CM

## 2015-10-08 DIAGNOSIS — F32A Depression, unspecified: Secondary | ICD-10-CM | POA: Insufficient documentation

## 2015-10-08 DIAGNOSIS — M546 Pain in thoracic spine: Secondary | ICD-10-CM

## 2015-10-08 MED ORDER — VORTIOXETINE HBR 5 MG PO TABS: 1.0000 | ORAL_TABLET | Freq: Every day | ORAL | Status: DC

## 2015-10-08 MED ORDER — MOMETASONE FUROATE 50 MCG/ACT NA SUSP: 4.0000 | Freq: Every day | NASAL | Status: DC

## 2015-10-08 NOTE — Assessment & Plan Note (Signed)
Dictating for some of this pain is secondary to patient having more of a muscle discomfort with her increasing her exercise. We discussed proper lifting mechanics. We discussed postural control exercises. Patient is going to change some of her exercises and modify them accordingly. We discussed different think she can do throughout the day to decrease the pain as well. Patient and will come back and see me again in 3-4 weeks for further evaluation and treatment.

## 2015-10-08 NOTE — Assessment & Plan Note (Signed)
Decision today to treat with OMT was based on Physical Exam  After verbal consent patient was treated with HVLA, ME techniques in Cervical, thoracic, lumbnar and sacral areas  Patient tolerated the procedure well with improvement in symptoms  Patient given exercises, stretches and lifestyle modifications  See medications in patient instructions if given  Patient will follow up in 4 weeks  

## 2015-10-08 NOTE — Patient Instructions (Signed)
Major Depressive Disorder Major depressive disorder is a mental illness. It also may be called clinical depression or unipolar depression. Major depressive disorder usually causes feelings of sadness, hopelessness, or helplessness. Some people with this disorder do not feel particularly sad but lose interest in doing things they used to enjoy (anhedonia). Major depressive disorder also can cause physical symptoms. It can interfere with work, school, relationships, and other normal everyday activities. The disorder varies in severity but is longer lasting and more serious than the sadness we all feel from time to time in our lives. Major depressive disorder often is triggered by stressful life events or major life changes. Examples of these triggers include divorce, loss of your job or home, a move, and the death of a family member or close friend. Sometimes this disorder occurs for no obvious reason at all. People who have family members with major depressive disorder or bipolar disorder are at higher risk for developing this disorder, with or without life stressors. Major depressive disorder can occur at any age. It may occur just once in your life (single episode major depressive disorder). It may occur multiple times (recurrent major depressive disorder). SYMPTOMS People with major depressive disorder have either anhedonia or depressed mood on nearly a daily basis for at least 2 weeks or longer. Symptoms of depressed mood include:  Feelings of sadness (blue or down in the dumps) or emptiness.  Feelings of hopelessness or helplessness.  Tearfulness or episodes of crying (may be observed by others).  Irritability (children and adolescents). In addition to depressed mood or anhedonia or both, people with this disorder have at least four of the following symptoms:  Difficulty sleeping or sleeping too much.   Significant change (increase or decrease) in appetite or weight.   Lack of energy or  motivation.  Feelings of guilt and worthlessness.   Difficulty concentrating, remembering, or making decisions.  Unusually slow movement (psychomotor retardation) or restlessness (as observed by others).   Recurrent wishes for death, recurrent thoughts of self-harm (suicide), or a suicide attempt. People with major depressive disorder commonly have persistent negative thoughts about themselves, other people, and the world. People with severe major depressive disorder may experiencedistorted beliefs or perceptions about the world (psychotic delusions). They also may see or hear things that are not real (psychotic hallucinations). DIAGNOSIS Major depressive disorder is diagnosed through an assessment by your health care provider. Your health care provider will ask aboutaspects of your daily life, such as mood,sleep, and appetite, to see if you have the diagnostic symptoms of major depressive disorder. Your health care provider may ask about your medical history and use of alcohol or drugs, including prescription medicines. Your health care provider also may do a physical exam and blood work. This is because certain medical conditions and the use of certain substances can cause major depressive disorder-like symptoms (secondary depression). Your health care provider also may refer you to a mental health specialist for further evaluation and treatment. TREATMENT It is important to recognize the symptoms of major depressive disorder and seek treatment. The following treatments can be prescribed for this disorder:   Medicine. Antidepressant medicines usually are prescribed. Antidepressant medicines are thought to correct chemical imbalances in the brain that are commonly associated with major depressive disorder. Other types of medicine may be added if the symptoms do not respond to antidepressant medicines alone or if psychotic delusions or hallucinations occur.  Talk therapy. Talk therapy can be  helpful in treating major depressive disorder by providing   support, education, and guidance. Certain types of talk therapy also can help with negative thinking (cognitive behavioral therapy) and with relationship issues that trigger this disorder (interpersonal therapy). A mental health specialist can help determine which treatment is best for you. Most people with major depressive disorder do well with a combination of medicine and talk therapy. Treatments involving electrical stimulation of the brain can be used in situations with extremely severe symptoms or when medicine and talk therapy do not work over time. These treatments include electroconvulsive therapy, transcranial magnetic stimulation, and vagal nerve stimulation.   This information is not intended to replace advice given to you by your health care provider. Make sure you discuss any questions you have with your health care provider.   Document Released: 01/09/2013 Document Revised: 10/05/2014 Document Reviewed: 01/09/2013 Elsevier Interactive Patient Education 2016 [employer]  

## 2015-10-08 NOTE — Progress Notes (Signed)
Subjective:  Patient ID: Laura Guerra, female    DOB: 08-30-1986  Age: 30 y.o. MRN: [id number]  CC: Depression and Allergic Rhinitis    HPI Laura Guerra Devan presents for depression and runny nose. She complains for years of worsening fatigue, insomnia, sadness, crying spells, irritability, and weight gain. She also complains of chronic runny nose with nasal congestion and wants to try a nasal spray. She has been taking Zyrtec-D with moderate symptom relief.  Outpatient Prescriptions Prior to Visit  Medication Sig Dispense Refill  . diclofenac (VOLTAREN) 75 MG EC tablet Take 1 tablet (75 mg total) by mouth 2 (two) times daily. 30 tablet 0  . levonorgestrel (MIRENA) 20 MCG/24HR IUD 1 each by Intrauterine route once.    . cyclobenzaprine (FLEXERIL) 5 MG tablet Take 1 tablet (5 mg total) by mouth 3 (three) times daily as needed for muscle spasms. 60 tablet 1   No facility-administered medications prior to visit.    ROS Review of Systems  Constitutional: Negative.  Negative for fever, chills, diaphoresis, activity change, appetite change, fatigue and unexpected weight change.  HENT: Positive for congestion, postnasal drip, rhinorrhea and sneezing. Negative for facial swelling, nosebleeds, sinus pressure, sore throat, tinnitus, trouble swallowing and voice change.   Eyes: Negative.   Respiratory: Negative.  Negative for cough, choking, shortness of breath and stridor.   Cardiovascular: Negative.  Negative for chest pain, palpitations and leg swelling.  Gastrointestinal: Negative.  Negative for nausea, vomiting, abdominal pain, diarrhea and constipation.  Endocrine: Negative.   Genitourinary: Negative.   Musculoskeletal: Negative.   Skin: Negative.   Allergic/Immunologic: Negative.   Neurological: Negative.  Negative for dizziness.  Hematological: Negative.  Negative for adenopathy. Does not bruise/bleed easily.  Psychiatric/Behavioral: Positive for sleep disturbance and  dysphoric mood. Negative for suicidal ideas, hallucinations, behavioral problems, confusion, self-injury, decreased concentration and agitation. The patient is nervous/anxious. The patient is not hyperactive.     Objective:  BP 100/80 mmHg  Pulse 74  Temp(Src) 98 F (36.7 C) (Oral)  Resp 16  Ht 5\' 5"  (1.651 m)  Wt 180 lb (81.647 kg)  BMI 29.95 kg/m2  SpO2 97%  BP Readings from Last 3 Encounters:  10/08/15 100/80  10/08/15 104/72  05/30/15 114/74    Wt Readings from Last 3 Encounters:  10/08/15 180 lb (81.647 kg)  10/08/15 179 lb (81.194 kg)  05/30/15 171 lb (77.565 kg)    Physical Exam  Psychiatric: Her behavior is normal. Judgment and thought content normal. Her mood appears not anxious. Her affect is not angry, not blunt, not labile and not inappropriate. Her speech is not rapid and/or pressured, not delayed and not tangential. She is not agitated, not slowed and not withdrawn. Cognition and memory are normal. She exhibits a depressed mood. She expresses no homicidal and no suicidal ideation. She expresses no suicidal plans and no homicidal plans.  She is crying intermittently today She is attentive.    Lab Results  Component Value Date   WBC 7.3 05/30/2015   HGB 15.0 05/30/2015   HCT 44.3 05/30/2015   PLT 229.0 05/30/2015   GLUCOSE 70 05/30/2015   CHOL 143 03/11/2015   TRIG 174.0* 03/11/2015   HDL 40.90 03/11/2015   LDLCALC 67 03/11/2015   ALT 8 05/30/2015   AST 11 05/30/2015   NA 141 05/30/2015   K 4.2 05/30/2015   CL 107 05/30/2015   CREATININE 0.72 05/30/2015   BUN 12 05/30/2015   CO2 28 05/30/2015   TSH 2.56  03/11/2015    Dg Chest 2 View  05/30/2015  CLINICAL DATA:  Upper left-sided chest pain radiating to the back for the past 2 days, history of tobacco use EXAM: CHEST  2 VIEW COMPARISON:  None. FINDINGS: The lungs are mildly hyperinflated with hemidiaphragm flattening. There is no focal infiltrate. There is no pleural effusion. The heart and pulmonary  vascularity are normal. The mediastinum is normal in width. The bony thorax is unremarkable. IMPRESSION: COPD.  There is no active cardiopulmonary disease. Electronically Signed   By: David  SwazilandJordan M.D.   On: 05/30/2015 10:55    Assessment & Plan:   Laura Guerra was seen today for depression and allergic rhinitis .  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 -     Vortioxetine HBr (TRINTELLIX) 5 MG TABS; Take 1 tablet (5 mg total) by mouth daily.  Other seasonal allergic rhinitis -     mometasone (NASONEX) 50 MCG/ACT nasal spray; Place 4 sprays into the nose daily.   I have discontinued Ms. Kubota's cyclobenzaprine. I am also having her start on Vortioxetine HBr and mometasone. Additionally, I am having her maintain her levonorgestrel and diclofenac.  Meds ordered this encounter  Medications  . Vortioxetine HBr (TRINTELLIX) 5 MG TABS    Sig: Take 1 tablet (5 mg total) by mouth daily.    Dispense:  28 tablet    Refill:  0  . mometasone (NASONEX) 50 MCG/ACT nasal spray    Sig: Place 4 sprays into the nose daily.    Dispense:  17 g    Refill:  12     Follow-up: Return in about 4 weeks (around 11/05/2015).  Sanda Lingerhomas Prophet Renwick, MD

## 2015-10-08 NOTE — Progress Notes (Signed)
Pre visit review using our clinic review tool, if applicable. No additional management support is needed unless otherwise documented below in the visit note. 

## 2015-10-08 NOTE — Progress Notes (Signed)
Laura Guerra [address] Phone: [phone] Subjective:    I'm seeing this patient by the request  of:  Laura Linger, MD   CC: Back pain follow-up  UJW:[id number] RAVONDA Guerra is a 30 y.o. female coming in with complaint of Back pain. Patient was seen previously and is having more of an upper neck pain. Patient states that she has been working on a more regular basis. Patient was to continue with that she is having increasing neck pain. Seems to be in the bilateral shoulders. No radiation down the arms  Past Medical History  Diagnosis Date  . No pertinent past medical history   . Seasonal depression [facility])    Past Surgical History  Procedure Laterality Date  . No past surgeries    . Wisdom tooth extraction     Social History  Substance Use Topics  . Smoking status: Current Every Day Smoker -- 1.00 packs/day for 20 years    Types: Cigarettes  . Smokeless tobacco: Never Used  . Alcohol Use: No     Comment: occasionally   No Known Allergies Family History  Problem Relation Age of Onset  . Cancer Father   . Cancer Maternal Grandmother         Past medical history, social, surgical and family history all reviewed in electronic medical record.   Review of Systems: No headache, visual changes, nausea, vomiting, diarrhea, constipation, dizziness, abdominal pain, skin rash, fevers, chills, night sweats, weight loss, swollen lymph nodes, body aches, joint swelling, muscle aches, chest pain, shortness of breath, mood changes.   Objective Blood pressure 104/72, pulse 86, height 5\' 5"  (1.651 m), weight 179 lb (81.194 kg), SpO2 97 %.  General: No apparent distress alert and oriented x3 mood and affect normal, dressed appropriately.  HEENT: Pupils equal, extraocular movements intact  Respiratory: Patient's speak in full sentences and does not appear short of breath  Cardiovascular: No lower extremity edema, non tender, no erythema    Skin: Warm dry intact with no signs of infection or rash on extremities or on axial skeleton.  Abdomen: Soft nontender  Neuro: Cranial nerves II through XII are intact, neurovascularly intact in all extremities with 2+ DTRs and 2+ pulses.  Lymph: No lymphadenopathy of posterior or anterior cervical chain or axillae bilaterally.  Gait normal with good balance and coordination.  MSK:  Non tender with full range of motion and good stability and symmetric strength and tone of shoulders, elbows, wrist, hip, knee and ankles bilaterally.  Back Exam:  Inspection: Unremarkable  Motion: Flexion 35 deg, Extension 25 deg, Side Bending to 25 deg bilaterally,  Rotation to 45 deg bilaterally  SLR laying: Negative  XSLR laying: Negative  Palpable tenderness: or tenderness to palpation around the scapular region medially. FABER: Positive right side Sensory change: Gross sensation intact to all lumbar and sacral dermatomes.  Reflexes: 2+ at both patellar tendons, 2+ at achilles tendons, Babinski's downgoing.  Strength at foot  Plantar-flexion: 5/5 Dorsi-flexion: 5/5 Eversion: 4/5 Inversion: 5/5  Leg strength  Quad: 5/5 Hamstring: 5/5 Hip flexor: 4/5 Hip abductors: 4/5  Gait unremarkable. Neck: Inspection unremarkable. No palpable stepoffs. Negative Spurling's maneuver. Mild limitation with side bending to the right side bending to the left Grip strength and sensation normal in bilateral hands Strength good C4 to T1 distribution No sensory change to C4 to T1 Negative Hoffman sign bilaterally Reflexes normal  Osteopathic findings C2 flexed rotated and side bent right C4 flexed  rotated and side bent left T1 extended rotated and side bent left with elevated first rib T5 E RS RIGHT L2 F RS left Sacrum right on right       Impression and Recommendations:     This case required medical decision making of moderate complexity.

## 2015-10-08 NOTE — Patient Instructions (Signed)
Good to see you I am glad you are working out Show that husband what you have been doing ;) Stretches for the inside of the legs, message can help See me again in 3-4 weeks

## 2015-10-09 ENCOUNTER — Ambulatory Visit: Payer: BLUE CROSS/BLUE SHIELD | Admitting: Internal Medicine

## 2015-10-28 ENCOUNTER — Encounter: Payer: Self-pay | Admitting: Family Medicine

## 2015-10-28 ENCOUNTER — Ambulatory Visit (INDEPENDENT_AMBULATORY_CARE_PROVIDER_SITE_OTHER): Payer: BLUE CROSS/BLUE SHIELD | Admitting: Family Medicine

## 2015-10-28 VITALS — BP 106/78 | HR 88 | Ht 65.0 in | Wt 180.0 lb

## 2015-10-28 DIAGNOSIS — M999 Biomechanical lesion, unspecified: Secondary | ICD-10-CM | POA: Insufficient documentation

## 2015-10-28 DIAGNOSIS — M9903 Segmental and somatic dysfunction of lumbar region: Secondary | ICD-10-CM | POA: Diagnosis not present

## 2015-10-28 DIAGNOSIS — M9904 Segmental and somatic dysfunction of sacral region: Secondary | ICD-10-CM

## 2015-10-28 DIAGNOSIS — M545 Low back pain, unspecified: Secondary | ICD-10-CM

## 2015-10-28 DIAGNOSIS — M9902 Segmental and somatic dysfunction of thoracic region: Secondary | ICD-10-CM | POA: Diagnosis not present

## 2015-10-28 NOTE — Assessment & Plan Note (Signed)
Patient continues to have some mild low back pain. Still secondary to the weakness and core and the muscle imbalances. Encourage her to continue to do certain exercises and we discussed which ones to potentially avoid. Patient has a trial of topical anti-inflammatories and we need to consider given her prescription if she continues to have difficulty. Patient and will come back and see me again in 3-4 weeks for further evaluation and treatment.

## 2015-10-28 NOTE — Progress Notes (Signed)
Pre visit review using our clinic review tool, if applicable. No additional management support is needed unless otherwise documented below in the visit note. 

## 2015-10-28 NOTE — Patient Instructions (Signed)
Good to see you and keep it up! Ice is your friend 2 glasses of water in the AM immediately  Try to have water before every meal Eat every 2 hours (about 100 calories) Protein about 100grams daily  Workout then eat before bed, not the reverse.  Limit carbs before bed as well.  See me again in 3-4 weeks.

## 2015-10-28 NOTE — Progress Notes (Signed)
[name] [address] Phone: [phone] Subjective:    I'm seeing this patient by the request  of:  Sanda Linger, MD   CC: Back pain follow-up  UJW:JXBJYNWGNF TASHEENA WAMBOLT is a 30 y.o. female coming in with complaint of Back pain. Patient was seen previously and is having more of an upper neck pain. Patient states that she has been working on a more regular basis. Patient was to continue with that she is having increasing neck pain. Extremity more low back pain. General work on a more regular basis. Patient denies any radiation of pain. Patient is frustrated because she has not lost any significant weight. Continues to try to stay active so.  Past Medical History  Diagnosis Date  . No pertinent past medical history   . Seasonal depression [facility])    Past Surgical History  Procedure Laterality Date  . No past surgeries    . Wisdom tooth extraction     Social History  Substance Use Topics  . Smoking status: Current Every Day Smoker -- 1.00 packs/day for 20 years    Types: Cigarettes  . Smokeless tobacco: Never Used  . Alcohol Use: No     Comment: occasionally   No Known Allergies Family History  Problem Relation Age of Onset  . Cancer Father   . Cancer Maternal Grandmother         Past medical history, social, surgical and family history all reviewed in electronic medical record.   Review of Systems: No headache, visual changes, nausea, vomiting, diarrhea, constipation, dizziness, abdominal pain, skin rash, fevers, chills, night sweats, weight loss, swollen lymph nodes, body aches, joint swelling, muscle aches, chest pain, shortness of breath, mood changes.   Objective Blood pressure 106/78, pulse 88, height  (1.651 m), weight 180 lb (81.647 kg), SpO2 96 %.  General: No apparent distress alert and oriented x3 mood and affect normal, dressed appropriately.  HEENT: Pupils equal, extraocular movements intact    Respiratory: Patient's speak in full sentences and does not appear short of breath  Cardiovascular: No lower extremity edema, non tender, no erythema  Skin: Warm dry intact with no signs of infection or rash on extremities or on axial skeleton.  Abdomen: Soft nontender  Neuro: Cranial nerves II through XII are intact, neurovascularly intact in all extremities with 2+ DTRs and 2+ pulses.  Lymph: No lymphadenopathy of posterior or anterior cervical chain or axillae bilaterally.  Gait normal with good balance and coordination.  MSK:  Non tender with full range of motion and good stability and symmetric strength and tone of shoulders, elbows, wrist, hip, knee and ankles bilaterally.  Back Exam:  Inspection: Unremarkable  Motion: Flexion 35 deg, Extension 25 deg, Side Bending to 25 deg bilaterally,  Rotation to 45 deg bilaterally  SLR laying: Negative  XSLR laying: Negative  Palpable tenderness: Tenderness of the lumbar spine today. FABER: Positive right side Sensory change: Gross sensation intact to all lumbar and sacral dermatomes.  Reflexes: 2+ at both patellar tendons, 2+ at achilles tendons, Babinski's downgoing.  Strength at foot  Plantar-flexion: 5/5 Dorsi-flexion: 5/5 Eversion: 4/5 Inversion: 5/5  Leg strength  Quad: 5/5 Hamstring: 5/5 Hip flexor: 4/5 Hip abductors: 4/5  Gait unremarkable. Neck: Inspection unremarkable. No palpable stepoffs. Negative Spurling's maneuver. Mild limitation with side bending to the right side bending to the left Grip strength and sensation normal in bilateral hands Strength good C4 to T1 distribution No sensory change to  C4 to T1 Negative Hoffman sign bilaterally Reflexes normal  Osteopathic findings C2 flexed rotated and side bent right C4 flexed rotated and side bent left T1 extended rotated and side bent left with elevated first rib T5 E RS RIGHT L2 F RS left Sacrum right on right  Pelvic shear noted      Impression and  Recommendations:     This case required medical decision making of moderate complexity.

## 2015-10-28 NOTE — Assessment & Plan Note (Signed)
Decision today to treat with OMT was based on Physical Exam  After verbal consent patient was treated with HVLA, ME techniques in Cervical, thoracic, lumbnar and sacral areas  Patient tolerated the procedure well with improvement in symptoms  Patient given exercises, stretches and lifestyle modifications  See medications in patient instructions if given  Patient will follow up in 4 weeks  

## 2015-11-11 ENCOUNTER — Telehealth: Payer: Self-pay | Admitting: Internal Medicine

## 2015-11-11 DIAGNOSIS — F32A Depression, unspecified: Secondary | ICD-10-CM

## 2015-11-11 DIAGNOSIS — F450 Somatization disorder: Principal | ICD-10-CM

## 2015-11-11 DIAGNOSIS — F329 Major depressive disorder, single episode, unspecified: Secondary | ICD-10-CM

## 2015-11-11 MED ORDER — VORTIOXETINE HBR 5 MG PO TABS: 1.0000 | ORAL_TABLET | Freq: Every day | ORAL | Status: DC

## 2015-11-11 NOTE — Telephone Encounter (Signed)
Refill request for trintellix  to be sent to [facility] on [address]

## 2015-11-11 NOTE — Telephone Encounter (Signed)
done

## 2015-11-26 ENCOUNTER — Telehealth: Payer: Self-pay

## 2015-11-26 DIAGNOSIS — F17200 Nicotine dependence, unspecified, uncomplicated: Secondary | ICD-10-CM

## 2015-11-26 DIAGNOSIS — F32A Depression, unspecified: Secondary | ICD-10-CM

## 2015-11-26 DIAGNOSIS — F329 Major depressive disorder, single episode, unspecified: Secondary | ICD-10-CM

## 2015-11-26 DIAGNOSIS — F450 Somatization disorder: Secondary | ICD-10-CM

## 2015-11-26 MED ORDER — VORTIOXETINE HBR 10 MG PO TABS: 1.0000 | ORAL_TABLET | Freq: Every day | ORAL | Status: DC

## 2015-11-26 NOTE — Telephone Encounter (Signed)
Pt has been doubling 5 mg of trintellix would like to increase to 10 mg. Please advise

## 2015-11-26 NOTE — Telephone Encounter (Signed)
done

## 2016-01-04 ENCOUNTER — Other Ambulatory Visit: Payer: Self-pay | Admitting: Internal Medicine

## 2016-01-13 ENCOUNTER — Encounter: Payer: Self-pay | Admitting: Family Medicine

## 2016-01-13 ENCOUNTER — Ambulatory Visit (INDEPENDENT_AMBULATORY_CARE_PROVIDER_SITE_OTHER): Payer: BLUE CROSS/BLUE SHIELD | Admitting: Family Medicine

## 2016-01-13 VITALS — BP 122/80 | HR 72 | Wt 181.0 lb

## 2016-01-13 DIAGNOSIS — M999 Biomechanical lesion, unspecified: Secondary | ICD-10-CM

## 2016-01-13 DIAGNOSIS — M9902 Segmental and somatic dysfunction of thoracic region: Secondary | ICD-10-CM

## 2016-01-13 DIAGNOSIS — M545 Low back pain, unspecified: Secondary | ICD-10-CM

## 2016-01-13 DIAGNOSIS — M9903 Segmental and somatic dysfunction of lumbar region: Secondary | ICD-10-CM

## 2016-01-13 DIAGNOSIS — M9904 Segmental and somatic dysfunction of sacral region: Secondary | ICD-10-CM

## 2016-01-13 NOTE — Assessment & Plan Note (Signed)
Continues to have an poor core strength. I think patient and exams range the lumbar lordosis which then unfortunate but more stress on the upper back as well. I do think the patient's impression and cemented sensation can also contribute. Encourage her to do more the postural training. Discussed ergonomics at work. Discussed icing and patient will come back and see me again in 4-6 weeks.

## 2016-01-13 NOTE — Assessment & Plan Note (Signed)
Decision today to treat with OMT was based on Physical Exam  After verbal consent patient was treated with HVLA, ME techniques in Cervical, thoracic, lumbnar and sacral areas  Patient tolerated the procedure well with improvement in symptoms  Patient given exercises, stretches and lifestyle modifications  See medications in patient instructions if given  Patient will follow up in 4-8 weeks

## 2016-01-13 NOTE — Patient Instructions (Addendum)
Good to see you Laura Guerra is your friend  Try to continue to work on posture.  On wall with heels, butt shoulder and head touching for a goal of 5 minutes daily  Keep working on the weight Laura Guerra when you need it pennsaid pinkie amount topically 2 times daily as needed.  See me again in 6-8 weeks.

## 2016-01-13 NOTE — Progress Notes (Signed)
[name] [address] Phone: [phone] Subjective:    I'm seeing this patient by the request  of:  Sanda Linger, MD   CC: Back pain follow-up shoulder pain   [id number] JOLYNN BAJOREK is a 30 y.o. female coming in with complaint of Back pain. Patient was seen previously and is having more of an upper neck and back pain. Patient has responded fairly well to osteopathic manipulation. Continues to have a dull throbbing aching pain even in the low back. Patient does do a lot of lifting at work. No radiation down the legs. No numbness. Seems to alter stress in her shoulder she states.  Patient is also complaining of shoulder pain.states that it is right-sided. Seems to worsen when she is doing repetitive activity at work. Gets better when she is on the weekends. Has been working out with her daughter recently that has been more phone. Patient has tried to lose weight. Has lost 4 pounds since previous visit.  Past Medical History  Diagnosis Date  . No pertinent past medical history   . Seasonal depression [facility])    Past Surgical History  Procedure Laterality Date  . No past surgeries    . Wisdom tooth extraction     Social History  Substance Use Topics  . Smoking status: Current Every Day Smoker -- 1.00 packs/day for 20 years    Types: Cigarettes  . Smokeless tobacco: Never Used  . Alcohol Use: No     Comment: occasionally   No Known Allergies Family History  Problem Relation Age of Onset  . Cancer Father   . Cancer Maternal Grandmother         Past medical history, social, surgical and family history all reviewed in electronic medical record.   Review of Systems: No headache, visual changes, nausea, vomiting, diarrhea, constipation, dizziness, abdominal pain, skin rash, fevers, chills, night sweats, weight loss, swollen lymph nodes, body aches, joint swelling, muscle aches, chest pain, shortness of breath, mood changes.    Objective There were no vitals taken for this visit.  General: No apparent distress alert and oriented x3 mood and affect normal, dressed appropriately.  HEENT: Pupils equal, extraocular movements intact  Respiratory: Patient's speak in full sentences and does not appear short of breath  Cardiovascular: No lower extremity edema, non tender, no erythema  Skin: Warm dry intact with no signs of infection or rash on extremities or on axial skeleton.  Abdomen: Soft nontender  Neuro: Cranial nerves II through XII are intact, neurovascularly intact in all extremities with 2+ DTRs and 2+ pulses.  Lymph: No lymphadenopathy of posterior or anterior cervical chain or axillae bilaterally.  Gait normal with good balance and coordination.  MSK:  Non tender with full range of motion and good stability and symmetric strength and tone of  elbows, wrist, hip, knee and ankles bilaterally. Shoulder:right Inspection reveals no abnormalities, atrophy or asymmetry. Palpation is normal with no tenderness over AC joint or bicipital groove. ROM is full in all planes. Rotator cuff strength normal throughout. Mild impingement signs Speeds and Yergason's tests normal. No labral pathology noted with negative Obrien's, negative clunk and good stability. Normal scapular function observed. No painful arc and no drop arm sign. No apprehension sign Contralateral shoulder unremarkable   Back Exam:  Inspection: Unremarkable  Motion: Flexion 35 deg, Extension 25 deg, Side Bending to 25 deg bilaterally,  Rotation to 45 deg bilaterally  SLR laying: Negative  XSLR laying: Negative  Palpable tenderness: only minorly tender over the right sacroiliac joint FABER: Positive right side Sensory change: Gross sensation intact to all lumbar and sacral dermatomes.  Reflexes: 2+ at both patellar tendons, 2+ at achilles tendons, Babinski's downgoing.  Strength at foot  Plantar-flexion: 5/5 Dorsi-flexion: 5/5 Eversion: 4/5  Inversion: 5/5  Leg strength  Quad: 5/5 Hamstring: 5/5 Hip flexor: 4/5 Hip abductors: 4/5  Gait unremarkable. Neck: Inspection unremarkable. No palpable stepoffs. Negative Spurling's maneuver. Full range of motion which is an improvement Grip strength and sensation normal in bilateral hands Strength good C4 to T1 distribution No sensory change to C4 to T1 Negative Hoffman sign bilaterally Reflexes normal  Osteopathic findings C2 flexed rotated and side bent right C4 flexed rotated and side bent left T1 extended rotated and side bent left with elevated first rib T5 E RS RIGHT L2 F RS left Sacrum right on right  Improvement from previous exam      Impression and Recommendations:     This case required medical decision making of moderate complexity.

## 2016-02-10 ENCOUNTER — Encounter: Payer: Self-pay | Admitting: Family Medicine

## 2016-02-10 ENCOUNTER — Other Ambulatory Visit: Payer: Self-pay

## 2016-02-10 ENCOUNTER — Ambulatory Visit (INDEPENDENT_AMBULATORY_CARE_PROVIDER_SITE_OTHER): Payer: BLUE CROSS/BLUE SHIELD | Admitting: Family Medicine

## 2016-02-10 VITALS — BP 120/80 | HR 84 | Ht 65.0 in | Wt 181.0 lb

## 2016-02-10 DIAGNOSIS — M9902 Segmental and somatic dysfunction of thoracic region: Secondary | ICD-10-CM

## 2016-02-10 DIAGNOSIS — M999 Biomechanical lesion, unspecified: Secondary | ICD-10-CM

## 2016-02-10 DIAGNOSIS — M9904 Segmental and somatic dysfunction of sacral region: Secondary | ICD-10-CM

## 2016-02-10 DIAGNOSIS — M545 Low back pain, unspecified: Secondary | ICD-10-CM

## 2016-02-10 DIAGNOSIS — M25511 Pain in right shoulder: Secondary | ICD-10-CM

## 2016-02-10 DIAGNOSIS — M9903 Segmental and somatic dysfunction of lumbar region: Secondary | ICD-10-CM

## 2016-02-10 DIAGNOSIS — M7551 Bursitis of right shoulder: Secondary | ICD-10-CM | POA: Diagnosis not present

## 2016-02-10 MED ORDER — MELOXICAM 15 MG PO TABS: 15.0000 mg | ORAL_TABLET | Freq: Every day | ORAL | Status: DC

## 2016-02-10 MED ORDER — DICLOFENAC SODIUM 2 % TD SOLN: 2.0000 "application " | Freq: Two times a day (BID) | TRANSDERMAL | Status: DC

## 2016-02-10 NOTE — Assessment & Plan Note (Signed)
Decision today to treat with OMT was based on Physical Exam  After verbal consent patient was treated with HVLA, ME techniques in Cervical, thoracic, lumbnar and sacral areas  Patient tolerated the procedure well with improvement in symptoms  Patient given exercises, stretches and lifestyle modifications  See medications in patient instructions if given  Patient will follow up in 3-4 weeks

## 2016-02-10 NOTE — Assessment & Plan Note (Signed)
Shoulder Exercises that included:  Basic scapular stabilization to include adduction and depression of scapula Scaption, focusing on proper movement and good control Internal and External rotation utilizing a theraband, with elbow tucked at side entire time Rows with theraband  Meloxicam and pennsaid.  RTC in 3 weeks.

## 2016-02-10 NOTE — Assessment & Plan Note (Signed)
Continued tightness secondary to patient's back pain. I do think the core strength still needs to be addressed. Decline formal physical therapy again. We discussed icing. Patient will continue with the topical anti-implant towards and given a prescription anti-inflammatory. Follow up with me again in 3-4 weeks.

## 2016-02-10 NOTE — Patient Instructions (Signed)
Great to see you  Melxoicam daily for 10 days then as needed pennsaid pinkie amount topically 2 times daily as needed.  Ice 20 minutes 2 times daily. Usually after activity and before bed. Exercises 3 times a week.  Keep hands within peripheral vision with lifting.  If shoulder is not better see me again in 3-4 weeks.  If shoulder is worse we may need to consider injection.

## 2016-02-10 NOTE — Progress Notes (Signed)
Laura [facility] [address] Phone: [phone] Subjective:    I'm seeing this patient by the request  of:  Laura Linger, MD   CC: Back pain follow-up shoulder pain   [id number] Laura Guerra is a 30 y.o. female coming in with complaint of Back pain. Patient was seen previously and is having more of an upper neck and back pain. Patient has responded fairly well to osteopathic manipulation.Patient is having more tightness as well. Discuss it as a dull, throbbing aching pain. Denies any numbness. An states that she seems to be worsening of her previous symptoms.  Patient is also complaining of shoulder pain.states that it is right-sided. Seems to worsen when she is doing repetitive activity at work. Gets better when she is on the weekends. States that it can be come severe enough that she has to stop doing the repetitive activities. This seems to be worsening slowly over the course of time.  Past Medical History  Diagnosis Date  . No pertinent past medical history   . Seasonal depression [facility])    Past Surgical History  Procedure Laterality Date  . No past surgeries    . Wisdom tooth extraction     Social History  Substance Use Topics  . Smoking status: Current Every Day Smoker -- 1.00 packs/day for 20 years    Types: Cigarettes  . Smokeless tobacco: Never Used  . Alcohol Use: No     Comment: occasionally   No Known Allergies Family History  Problem Relation Age of Onset  . Cancer Father   . Cancer Maternal Grandmother         Past medical history, social, surgical and family history all reviewed in electronic medical record.   Review of Systems: No headache, visual changes, nausea, vomiting, diarrhea, constipation, dizziness, abdominal pain, skin rash, fevers, chills, night sweats, weight loss, swollen lymph nodes, body aches, joint swelling, muscle aches, chest pain, shortness of breath, mood changes.   Objective Blood  pressure 120/80, pulse 84, height  (1.651 m), weight 181 lb (82.101 kg), SpO2 97 %.  General: No apparent distress alert and oriented x3 mood and affect normal, dressed appropriately.  HEENT: Pupils equal, extraocular movements intact  Respiratory: Patient's speak in full sentences and does not appear short of breath  Cardiovascular: No lower extremity edema, non tender, no erythema  Skin: Warm dry intact with no signs of infection or rash on extremities or on axial skeleton.  Abdomen: Soft nontender  Neuro: Cranial nerves II through XII are intact, neurovascularly intact in all extremities with 2+ DTRs and 2+ pulses.  Lymph: No lymphadenopathy of posterior or anterior cervical chain or axillae bilaterally.  Gait normal with good balance and coordination.  MSK:  Non tender with full range of motion and good stability and symmetric strength and tone of  elbows, wrist, hip, knee and ankles bilaterally. Shoulder: Right Inspection reveals no abnormalities, atrophy or asymmetry. Palpation is normal with no tenderness over AC joint or bicipital groove. ROM is full in all planes passively. Rotator cuff strength normal throughout. signs of impingement with positive Neer and Hawkin's tests, but negative empty can sign. Speeds and Yergason's tests normal. No labral pathology noted with negative Obrien's, negative clunk and good stability. Normal scapular function observed. No painful arc and no drop arm sign. No apprehension sign Contralateral shoulder unremarkable  MSK US performed of: Right This study was ordered, performed, and interpreted by Terrilee Files D.O.  Shoulder:  Supraspinatus:  Appears normal on long and transverse views, Bursal bulge seen with shoulder abduction on impingement view. Infraspinatus:  Appears normal on long and transverse views. Significant increase in Doppler flow Subscapularis:  Appears normal on long and transverse views. Positive bursa Teres Minor:  Appears  normal on long and transverse views. AC joint:  Capsule undistended, no geyser sign. Glenohumeral Joint:  Appears normal without effusion. Glenoid Labrum:  Intact without visualized tears. Biceps Tendon:  Appears normal on long and transverse views, no fraying of tendon, tendon located in intertubercular groove, no subluxation with shoulder internal or external rotation.  Impression: Subacromial bursitis rotator cuff syndrome     Back Exam:  Inspection: Unremarkable  Motion: Flexion 35 deg, Extension 25 deg, Side Bending to 25 deg bilaterally,  Rotation to 45 deg bilaterally  SLR laying: Negative  XSLR laying: Negative  Palpable tenderness: Continued tenderness over the right sacroiliac joint patient also has more discomfort over the paraspinal musculature of the thoracic spine today that is different than previously. FABER: Positive right side Sensory change: Gross sensation intact to all lumbar and sacral dermatomes.  Reflexes: 2+ at both patellar tendons, 2+ at achilles tendons, Babinski's downgoing.  Strength at foot  Plantar-flexion: 5/5 Dorsi-flexion: 5/5 Eversion: 4/5 Inversion: 5/5  Leg strength  Quad: 5/5 Hamstring: 5/5 Hip flexor: 4/5 Hip abductors: 4/5  Gait unremarkable. Neck: Inspection unremarkable. No palpable stepoffs. Negative Spurling's maneuver. Patient does have tightness on the right side especially the trapezius muscle. Grip strength and sensation normal in bilateral hands Strength good C4 to T1 distribution No sensory change to C4 to T1 Negative Hoffman sign bilaterally Reflexes normal  Osteopathic findings C2 flexed rotated and side bent right C4 flexed rotated and side bent left T1 extended rotated and side bent left with elevated first rib T5 extended rotated and side bent right L2 F RS left Sacrum right on right       Impression and Recommendations:     This case required medical decision making of moderate complexity.

## 2016-02-10 NOTE — Progress Notes (Signed)
Pre visit review using our clinic review tool, if applicable. No additional management support is needed unless otherwise documented below in the visit note. 

## 2016-03-02 ENCOUNTER — Ambulatory Visit (INDEPENDENT_AMBULATORY_CARE_PROVIDER_SITE_OTHER): Payer: BLUE CROSS/BLUE SHIELD | Admitting: Family Medicine

## 2016-03-02 ENCOUNTER — Encounter: Payer: Self-pay | Admitting: Family Medicine

## 2016-03-02 ENCOUNTER — Ambulatory Visit: Payer: BLUE CROSS/BLUE SHIELD | Admitting: Family Medicine

## 2016-03-02 VITALS — BP 104/70 | HR 72 | Ht 65.0 in | Wt 184.0 lb

## 2016-03-02 DIAGNOSIS — M999 Biomechanical lesion, unspecified: Secondary | ICD-10-CM

## 2016-03-02 DIAGNOSIS — M546 Pain in thoracic spine: Secondary | ICD-10-CM | POA: Diagnosis not present

## 2016-03-02 DIAGNOSIS — M9904 Segmental and somatic dysfunction of sacral region: Secondary | ICD-10-CM

## 2016-03-02 DIAGNOSIS — M9902 Segmental and somatic dysfunction of thoracic region: Secondary | ICD-10-CM | POA: Diagnosis not present

## 2016-03-02 DIAGNOSIS — M9903 Segmental and somatic dysfunction of lumbar region: Secondary | ICD-10-CM

## 2016-03-02 NOTE — Progress Notes (Signed)
Laura [facility] [address] Phone: [phone] Subjective:    I'm seeing this patient by the request  of:  Sanda Linger, MD   CC: Back pain follow-up shoulder pain   [id number] Laura Guerra is a 30 y.o. female coming in with complaint of Back pain. Patient states that her lower back pain seems to have almost completely resolved. Has been running on a more regular basis. Upper back is doing relatively well but still has tightness. More tightness on the left upper back and around the shoulder. Different than her usual pain. Denies any association with food. States she did sleep at a friend's house that could've contributed to it.  Patient was having a right shoulder pain previously and it has completely resolved.  Past Medical History  Diagnosis Date  . No pertinent past medical history   . Seasonal depression [facility])    Past Surgical History  Procedure Laterality Date  . No past surgeries    . Wisdom tooth extraction     Social History  Substance Use Topics  . Smoking status: Current Every Day Smoker -- 1.00 packs/day for 20 years    Types: Cigarettes  . Smokeless tobacco: Never Used  . Alcohol Use: No     Comment: occasionally   No Known Allergies Family History  Problem Relation Age of Onset  . Cancer Father   . Cancer Maternal Grandmother         Past medical history, social, surgical and family history all reviewed in electronic medical record.   Review of Systems: No headache, visual changes, nausea, vomiting, diarrhea, constipation, dizziness, abdominal pain, skin rash, fevers, chills, night sweats, weight loss, swollen lymph nodes, body aches, joint swelling, muscle aches, chest pain, shortness of breath, mood changes.   Objective Blood pressure 104/70, pulse 72, height  (1.651 m), weight 184 lb (83.462 kg), SpO2 97 %.  General: No apparent distress alert and oriented x3 mood and affect normal, dressed  appropriately.  HEENT: Pupils equal, extraocular movements intact  Respiratory: Patient's speak in full sentences and does not appear short of breath  Cardiovascular: No lower extremity edema, non tender, no erythema  Skin: Warm dry intact with no signs of infection or rash on extremities or on axial skeleton.  Abdomen: Soft nontender  Neuro: Cranial nerves II through XII are intact, neurovascularly intact in all extremities with 2+ DTRs and 2+ pulses.  Lymph: No lymphadenopathy of posterior or anterior cervical chain or axillae bilaterally.  Gait normal with good balance and coordination.  MSK:  Non tender with full range of motion and good stability and symmetric strength and tone of  elbows, wrist, hip, knee and ankles bilaterally.   Back Exam:  Inspection: Unremarkable  Motion: Flexion 30 deg, Extension 25 deg, Side Bending to 25 deg bilaterally,  Rotation to 45 deg bilaterally  SLR laying: Negative  XSLR laying: Negative  Palpable tenderness: More tightness in the paraspinal musculature of the thoracic spine on the left side. FABER: Positive right side still present Sensory change: Gross sensation intact to all lumbar and sacral dermatomes.  Reflexes: 2+ at both patellar tendons, 2+ at achilles tendons, Babinski's downgoing.  Strength at foot  Plantar-flexion: 5/5 Dorsi-flexion: 5/5 Eversion: 4/5 Inversion: 5/5  Leg strength  Quad: 5/5 Hamstring: 5/5 Hip flexor: 4/5 Hip abductors: 4/5 with no change or improvement Gait unremarkable. Neck: Inspection unremarkable. No palpable stepoffs. Negative Spurling's maneuver. Tightness of the trapezius bilaterally Grip strength and  sensation normal in bilateral hands Strength good C4 to T1 distribution No sensory change to C4 to T1 Negative Hoffman sign bilaterally Reflexes normal  Osteopathic findings C2 flexed rotated and side bent right C4 flexed rotated and side bent left T1 extended rotated and side bent left with elevated  first rib T3 extended rotated and side bent left with inhaled third rib T5 extended rotated and side bent right L2 F RS left Sacrum right on right       Impression and Recommendations:     This case required medical decision making of moderate complexity.

## 2016-03-02 NOTE — Assessment & Plan Note (Signed)
I do think the patient's pain is secondary to more of a thoracic back pain. I do think that she hold some of her stress as well as anxiety in her shoulders. We discussed posture, self and irritation techniques, icing and topical anti-inflammatory's. Patient will come back and see me again in 6-8 weeks. Continuing to respond well to osteopathic manipulation.

## 2016-03-02 NOTE — Progress Notes (Signed)
Pre visit review using our clinic review tool, if applicable. No additional management support is needed unless otherwise documented below in the visit note. 

## 2016-03-02 NOTE — Assessment & Plan Note (Signed)
Decision today to treat with OMT was based on Physical Exam  After verbal consent patient was treated with HVLA, ME techniques in Cervical, thoracic, lumbnar and sacral areas  Patient tolerated the procedure well with improvement in symptoms  Patient given exercises, stretches and lifestyle modifications  See medications in patient instructions if given  Patient will follow up in 6-8 weeks

## 2016-03-02 NOTE — Patient Instructions (Signed)
Good to see you as always.  Ice when you need it.  On wall with heels, butt shoulder and head touching for a goal of 5 minutes daily  Ice is your friend Try to seat after running See me again in 6-8 weeks.

## 2016-03-09 ENCOUNTER — Other Ambulatory Visit: Payer: Self-pay | Admitting: Family Medicine

## 2016-03-09 NOTE — Telephone Encounter (Signed)
Refill done.  

## 2016-03-16 ENCOUNTER — Other Ambulatory Visit (INDEPENDENT_AMBULATORY_CARE_PROVIDER_SITE_OTHER): Payer: BLUE CROSS/BLUE SHIELD

## 2016-03-16 ENCOUNTER — Encounter: Payer: Self-pay | Admitting: Internal Medicine

## 2016-03-16 ENCOUNTER — Ambulatory Visit (INDEPENDENT_AMBULATORY_CARE_PROVIDER_SITE_OTHER): Payer: BLUE CROSS/BLUE SHIELD | Admitting: Internal Medicine

## 2016-03-16 VITALS — BP 118/80 | HR 67 | Temp 98.1°F | Resp 16 | Ht 65.0 in | Wt 186.0 lb

## 2016-03-16 DIAGNOSIS — Z124 Encounter for screening for malignant neoplasm of cervix: Secondary | ICD-10-CM | POA: Insufficient documentation

## 2016-03-16 DIAGNOSIS — Z0000 Encounter for general adult medical examination without abnormal findings: Secondary | ICD-10-CM

## 2016-03-16 DIAGNOSIS — R7989 Other specified abnormal findings of blood chemistry: Secondary | ICD-10-CM | POA: Diagnosis not present

## 2016-03-16 LAB — COMPREHENSIVE METABOLIC PANEL
ALBUMIN: 4.1 g/dL (ref 3.5–5.2)
ALT: 8 U/L (ref 0–35)
AST: 10 U/L (ref 0–37)
Alkaline Phosphatase: 48 U/L (ref 39–117)
BUN: 13 mg/dL (ref 6–23)
CALCIUM: 9 mg/dL (ref 8.4–10.5)
CHLORIDE: 106 meq/L (ref 96–112)
CO2: 28 mEq/L (ref 19–32)
Creatinine, Ser: 0.78 mg/dL (ref 0.40–1.20)
GFR: 92.13 mL/min (ref >60.00)
Glucose, Bld: 67 mg/dL — ABNORMAL LOW (ref 70–99)
POTASSIUM: 3.8 meq/L (ref 3.5–5.1)
SODIUM: 142 meq/L (ref 135–145)
Total Bilirubin: 1 mg/dL (ref 0.2–1.2)
Total Protein: 6.3 g/dL (ref 6.0–8.3)

## 2016-03-16 LAB — URINALYSIS, ROUTINE W REFLEX MICROSCOPIC
BILIRUBIN URINE: NEGATIVE
KETONES UR: NEGATIVE
Leukocytes, UA: NEGATIVE
NITRITE: NEGATIVE
PH: 5.5 (ref 5.0–8.0)
Specific Gravity, Urine: 1.025 (ref 1.000–1.030)
Total Protein, Urine: NEGATIVE
Urine Glucose: NEGATIVE
Urobilinogen, UA: 0.2 (ref 0.0–1.0)

## 2016-03-16 LAB — CBC WITH DIFFERENTIAL/PLATELET
BASOS PCT: 0.2 % (ref 0.0–3.0)
Basophils Absolute: 0 10*3/uL (ref 0.0–0.1)
EOS PCT: 3.2 % (ref 0.0–5.0)
Eosinophils Absolute: 0.3 10*3/uL (ref 0.0–0.7)
HEMATOCRIT: 43.2 % (ref 36.0–46.0)
HEMOGLOBIN: 14.9 g/dL (ref 12.0–15.0)
LYMPHS PCT: 33.6 % (ref 12.0–46.0)
Lymphs Abs: 3.1 10*3/uL (ref 0.7–4.0)
MCHC: 34.4 g/dL (ref 30.0–36.0)
MCV: 88.1 fl (ref 78.0–100.0)
Monocytes Absolute: 0.9 10*3/uL (ref 0.1–1.0)
Monocytes Relative: 9.5 % (ref 3.0–12.0)
Neutro Abs: 4.9 10*3/uL (ref 1.4–7.7)
Neutrophils Relative %: 53.5 % (ref 43.0–77.0)
Platelets: 224 10*3/uL (ref 150.0–400.0)
RBC: 4.9 Mil/uL (ref 3.87–5.11)
RDW: 13.1 % (ref 11.5–15.5)
WBC: 9.1 10*3/uL (ref 4.0–10.5)

## 2016-03-16 LAB — LIPID PANEL
CHOLESTEROL: 144 mg/dL (ref 0–200)
HDL: 36.1 mg/dL — AB (ref >39.00)
NonHDL: 107.91
Total CHOL/HDL Ratio: 4
Triglycerides: 224 mg/dL — ABNORMAL HIGH (ref 0.0–149.0)
VLDL: 44.8 mg/dL — AB (ref 0.0–40.0)

## 2016-03-16 LAB — LDL CHOLESTEROL, DIRECT: LDL DIRECT: 83 mg/dL

## 2016-03-16 LAB — TSH: TSH: 3.64 u[IU]/mL (ref 0.35–4.50)

## 2016-03-16 NOTE — Progress Notes (Signed)
Pre visit review using our clinic review tool, if applicable. No additional management support is needed unless otherwise documented below in the visit note. 

## 2016-03-16 NOTE — Patient Instructions (Signed)
Preventive Care for Adults, Female A healthy lifestyle and preventive care can promote health and wellness. Preventive health guidelines for women include the following key practices.  A routine yearly physical is a good way to check with your health care provider about your health and preventive screening. It is a chance to share any concerns and updates on your health and to receive a thorough exam.  Visit your dentist for a routine exam and preventive care every 6 months. Brush your teeth twice a day and floss once a day. Good oral hygiene prevents tooth decay and gum disease.  The frequency of eye exams is based on your age, health, family medical history, use of contact lenses, and other factors. Follow your health care provider's recommendations for frequency of eye exams.  Eat a healthy diet. Foods like vegetables, fruits, whole grains, low-fat dairy products, and lean protein foods contain the nutrients you need without too many calories. Decrease your intake of foods high in solid fats, added sugars, and salt. Eat the right amount of calories for you.Get information about a proper diet from your health care provider, if necessary.  Regular physical exercise is one of the most important things you can do for your health. Most adults should get at least 150 minutes of moderate-intensity exercise (any activity that increases your heart rate and causes you to sweat) each week. In addition, most adults need muscle-strengthening exercises on 2 or more days a week.  Maintain a healthy weight. The body mass index (BMI) is a screening tool to identify possible weight problems. It provides an estimate of body fat based on height and weight. Your health care provider can find your BMI and can help you achieve or maintain a healthy weight.For adults 20 years and older:  A BMI below 18.5 is considered underweight.  A BMI of 18.5 to 24.9 is normal.  A BMI of 25 to 29.9 is considered overweight.  A  BMI of 30 and above is considered obese.  Maintain normal blood lipids and cholesterol levels by exercising and minimizing your intake of saturated fat. Eat a balanced diet with plenty of fruit and vegetables. Blood tests for lipids and cholesterol should begin at age 61 and be repeated every 5 years. If your lipid or cholesterol levels are high, you are over 50, or you are at high risk for heart disease, you may need your cholesterol levels checked more frequently.Ongoing high lipid and cholesterol levels should be treated with medicines if diet and exercise are not working.  If you smoke, find out from your health care provider how to quit. If you do not use tobacco, do not start.  Lung cancer screening is recommended for adults aged 26-80 years who are at high risk for developing lung cancer because of a history of smoking. A yearly low-dose CT scan of the lungs is recommended for people who have at least a 30-pack-year history of smoking and are a current smoker or have quit within the past 15 years. A pack year of smoking is smoking an average of 1 pack of cigarettes a day for 1 year (for example: 1 pack a day for 30 years or 2 packs a day for 15 years). Yearly screening should continue until the smoker has stopped smoking for at least 15 years. Yearly screening should be stopped for people who develop a health problem that would prevent them from having lung cancer treatment.  If you are pregnant, do not drink alcohol. If you are  breastfeeding, be very cautious about drinking alcohol. If you are not pregnant and choose to drink alcohol, do not have more than 1 drink per day. One drink is considered to be 12 ounces (355 mL) of beer, 5 ounces (148 mL) of wine, or 1.5 ounces (44 mL) of liquor.  Avoid use of street drugs. Do not share needles with anyone. Ask for help if you need support or instructions about stopping the use of drugs.  High blood pressure causes heart disease and increases the risk  of stroke. Your blood pressure should be checked at least every 1 to 2 years. Ongoing high blood pressure should be treated with medicines if weight loss and exercise do not work.  If you are 55-79 years old, ask your health care provider if you should take aspirin to prevent strokes.  Diabetes screening is done by taking a blood sample to check your blood glucose level after you have not eaten for a certain period of time (fasting). If you are not overweight and you do not have risk factors for diabetes, you should be screened once every 3 years starting at age 45. If you are overweight or obese and you are 40-70 years of age, you should be screened for diabetes every year as part of your cardiovascular risk assessment.  Breast cancer screening is essential preventive care for women. You should practice "breast self-awareness." This means understanding the normal appearance and feel of your breasts and may include breast self-examination. Any changes detected, no matter how small, should be reported to a health care provider. Women in their 20s and 30s should have a clinical breast exam (CBE) by a health care provider as part of a regular health exam every 1 to 3 years. After age 40, women should have a CBE every year. Starting at age 40, women should consider having a mammogram (breast X-ray test) every year. Women who have a family history of breast cancer should talk to their health care provider about genetic screening. Women at a high risk of breast cancer should talk to their health care providers about having an MRI and a mammogram every year.  Breast cancer gene (BRCA)-related cancer risk assessment is recommended for women who have family members with BRCA-related cancers. BRCA-related cancers include breast, ovarian, tubal, and peritoneal cancers. Having family members with these cancers may be associated with an increased risk for harmful changes (mutations) in the breast cancer genes BRCA1 and  BRCA2. Results of the assessment will determine the need for genetic counseling and BRCA1 and BRCA2 testing.  Your health care provider may recommend that you be screened regularly for cancer of the pelvic organs (ovaries, uterus, and vagina). This screening involves a pelvic examination, including checking for microscopic changes to the surface of your cervix (Pap test). You may be encouraged to have this screening done every 3 years, beginning at age 21.  For women ages 30-65, health care providers may recommend pelvic exams and Pap testing every 3 years, or they may recommend the Pap and pelvic exam, combined with testing for human papilloma virus (HPV), every 5 years. Some types of HPV increase your risk of cervical cancer. Testing for HPV may also be done on women of any age with unclear Pap test results.  Other health care providers may not recommend any screening for nonpregnant women who are considered low risk for pelvic cancer and who do not have symptoms. Ask your health care provider if a screening pelvic exam is right for   you.  If you have had past treatment for cervical cancer or a condition that could lead to cancer, you need Pap tests and screening for cancer for at least 20 years after your treatment. If Pap tests have been discontinued, your risk factors (such as having a new sexual partner) need to be reassessed to determine if screening should resume. Some women have medical problems that increase the chance of getting cervical cancer. In these cases, your health care provider may recommend more frequent screening and Pap tests.  Colorectal cancer can be detected and often prevented. Most routine colorectal cancer screening begins at the age of 50 years and continues through age 75 years. However, your health care provider may recommend screening at an earlier age if you have risk factors for colon cancer. On a yearly basis, your health care provider may provide home test kits to check  for hidden blood in the stool. Use of a small camera at the end of a tube, to directly examine the colon (sigmoidoscopy or colonoscopy), can detect the earliest forms of colorectal cancer. Talk to your health care provider about this at age 50, when routine screening begins. Direct exam of the colon should be repeated every 5-10 years through age 75 years, unless early forms of precancerous polyps or small growths are found.  People who are at an increased risk for hepatitis B should be screened for this virus. You are considered at high risk for hepatitis B if:  You were born in a country where hepatitis B occurs often. Talk with your health care provider about which countries are considered high risk.  Your parents were born in a high-risk country and you have not received a shot to protect against hepatitis B (hepatitis B vaccine).  You have HIV or AIDS.  You use needles to inject street drugs.  You live with, or have sex with, someone who has hepatitis B.  You get hemodialysis treatment.  You take certain medicines for conditions like cancer, organ transplantation, and autoimmune conditions.  Hepatitis C blood testing is recommended for all people born from 1945 through 1965 and any individual with known risks for hepatitis C.  Practice safe sex. Use condoms and avoid high-risk sexual practices to reduce the spread of sexually transmitted infections (STIs). STIs include gonorrhea, chlamydia, syphilis, trichomonas, herpes, HPV, and human immunodeficiency virus (HIV). Herpes, HIV, and HPV are viral illnesses that have no cure. They can result in disability, cancer, and death.  You should be screened for sexually transmitted illnesses (STIs) including gonorrhea and chlamydia if:  You are sexually active and are younger than 24 years.  You are older than 24 years and your health care provider tells you that you are at risk for this type of infection.  Your sexual activity has changed  since you were last screened and you are at an increased risk for chlamydia or gonorrhea. Ask your health care provider if you are at risk.  If you are at risk of being infected with HIV, it is recommended that you take a prescription medicine daily to prevent HIV infection. This is called preexposure prophylaxis (PrEP). You are considered at risk if:  You are sexually active and do not regularly use condoms or know the HIV status of your partner(s).  You take drugs by injection.  You are sexually active with a partner who has HIV.  Talk with your health care provider about whether you are at high risk of being infected with HIV. If   you choose to begin PrEP, you should first be tested for HIV. You should then be tested every 3 months for as long as you are taking PrEP.  Osteoporosis is a disease in which the bones lose minerals and strength with aging. This can result in serious bone fractures or breaks. The risk of osteoporosis can be identified using a bone density scan. Women ages 5 years and over and women at risk for fractures or osteoporosis should discuss screening with their health care providers. Ask your health care provider whether you should take a calcium supplement or vitamin D to reduce the rate of osteoporosis.  Menopause can be associated with physical symptoms and risks. Hormone replacement therapy is available to decrease symptoms and risks. You should talk to your health care provider about whether hormone replacement therapy is right for you.  Use sunscreen. Apply sunscreen liberally and repeatedly throughout the day. You should seek shade when your shadow is shorter than you. Protect yourself by wearing long sleeves, pants, a wide-brimmed hat, and sunglasses year round, whenever you are outdoors.  Once a month, do a whole body skin exam, using a mirror to look at the skin on your back. Tell your health care provider of new moles, moles that have irregular borders, moles that  are larger than a pencil eraser, or moles that have changed in shape or color.  Stay current with required vaccines (immunizations).  Influenza vaccine. All adults should be immunized every year.  Tetanus, diphtheria, and acellular pertussis (Td, Tdap) vaccine. Pregnant women should receive 1 dose of Tdap vaccine during each pregnancy. The dose should be obtained regardless of the length of time since the last dose. Immunization is preferred during the 27th-36th week of gestation. An adult who has not previously received Tdap or who does not know her vaccine status should receive 1 dose of Tdap. This initial dose should be followed by tetanus and diphtheria toxoids (Td) booster doses every 10 years. Adults with an unknown or incomplete history of completing a 3-dose immunization series with Td-containing vaccines should begin or complete a primary immunization series including a Tdap dose. Adults should receive a Td booster every 10 years.  Varicella vaccine. An adult without evidence of immunity to varicella should receive 2 doses or a second dose if she has previously received 1 dose. Pregnant females who do not have evidence of immunity should receive the first dose after pregnancy. This first dose should be obtained before leaving the health care facility. The second dose should be obtained 4-8 weeks after the first dose.  Human papillomavirus (HPV) vaccine. Females aged 13-26 years who have not received the vaccine previously should obtain the 3-dose series. The vaccine is not recommended for use in pregnant females. However, pregnancy testing is not needed before receiving a dose. If a female is found to be pregnant after receiving a dose, no treatment is needed. In that case, the remaining doses should be delayed until after the pregnancy. Immunization is recommended for any person with an immunocompromised condition through the age of 68 years if she did not get any or all doses earlier. During the  3-dose series, the second dose should be obtained 4-8 weeks after the first dose. The third dose should be obtained 24 weeks after the first dose and 16 weeks after the second dose.  Zoster vaccine. One dose is recommended for adults aged 14 years or older unless certain conditions are present.  Measles, mumps, and rubella (MMR) vaccine. Adults born  before 1957 generally are considered immune to measles and mumps. Adults born in [birth year] or later should have 1 or more doses of MMR vaccine unless there is a contraindication to the vaccine or there is laboratory evidence of immunity to each of the three diseases. A routine second dose of MMR vaccine should be obtained at least 28 days after the first dose for students attending postsecondary schools, health care workers, or international travelers. People who received inactivated measles vaccine or an unknown type of measles vaccine during 1963-1967 should receive 2 doses of MMR vaccine. People who received inactivated mumps vaccine or an unknown type of mumps vaccine before 1979 and are at high risk for mumps infection should consider immunization with 2 doses of MMR vaccine. For females of childbearing age, rubella immunity should be determined. If there is no evidence of immunity, females who are not pregnant should be vaccinated. If there is no evidence of immunity, females who are pregnant should delay immunization until after pregnancy. Unvaccinated health care workers born before [birth year] who lack laboratory evidence of measles, mumps, or rubella immunity or laboratory confirmation of disease should consider measles and mumps immunization with 2 doses of MMR vaccine or rubella immunization with 1 dose of MMR vaccine.  Pneumococcal 13-valent conjugate (PCV13) vaccine. When indicated, a person who is uncertain of his immunization history and has no record of immunization should receive the PCV13 vaccine. All adults 55 years of age and older should receive this  vaccine. An adult aged 38 years or older who has certain medical conditions and has not been previously immunized should receive 1 dose of PCV13 vaccine. This PCV13 should be followed with a dose of pneumococcal polysaccharide (PPSV23) vaccine. Adults who are at high risk for pneumococcal disease should obtain the PPSV23 vaccine at least 8 weeks after the dose of PCV13 vaccine. Adults older than 30 years of age who have normal immune system function should obtain the PPSV23 vaccine dose at least 1 year after the dose of PCV13 vaccine.  Pneumococcal polysaccharide (PPSV23) vaccine. When PCV13 is also indicated, PCV13 should be obtained first. All adults aged 16 years and older should be immunized. An adult younger than age 65 years who has certain medical conditions should be immunized. Any person who resides in a nursing home or long-term care facility should be immunized. An adult smoker should be immunized. People with an immunocompromised condition and certain other conditions should receive both PCV13 and PPSV23 vaccines. People with human immunodeficiency virus (HIV) infection should be immunized as soon as possible after diagnosis. Immunization during chemotherapy or radiation therapy should be avoided. Routine use of PPSV23 vaccine is not recommended for American Indians, [place] Natives, or people younger than 65 years unless there are medical conditions that require PPSV23 vaccine. When indicated, people who have unknown immunization and have no record of immunization should receive PPSV23 vaccine. One-time revaccination 5 years after the first dose of PPSV23 is recommended for people aged 19-64 years who have chronic kidney failure, nephrotic syndrome, asplenia, or immunocompromised conditions. People who received 1-2 doses of PPSV23 before age 63 years should receive another dose of PPSV23 vaccine at age 49 years or later if at least 5 years have passed since the previous dose. Doses of PPSV23 are not  needed for people immunized with PPSV23 at or after age 8 years.  Meningococcal vaccine. Adults with asplenia or persistent complement component deficiencies should receive 2 doses of quadrivalent meningococcal conjugate (MenACWY-D) vaccine. The doses should be obtained  at least 2 months apart. Microbiologists working with certain meningococcal bacteria, [place] recruits, people at risk during an outbreak, and people who travel to or live in countries with a high rate of meningitis should be immunized. A first-year college student up through age 34 years who is living in a residence hall should receive a dose if she did not receive a dose on or after her 16th birthday. Adults who have certain high-risk conditions should receive one or more doses of vaccine.  Hepatitis A vaccine. Adults who wish to be protected from this disease, have certain high-risk conditions, work with hepatitis A-infected animals, work in hepatitis A research labs, or travel to or work in countries with a high rate of hepatitis A should be immunized. Adults who were previously unvaccinated and who anticipate close contact with an international adoptee during the first 60 days after arrival in the [place] from a country with a high rate of hepatitis A should be immunized.  Hepatitis B vaccine. Adults who wish to be protected from this disease, have certain high-risk conditions, may be exposed to blood or other infectious body fluids, are household contacts or sex partners of hepatitis B positive people, are clients or workers in certain care facilities, or travel to or work in countries with a high rate of hepatitis B should be immunized.  Haemophilus influenzae type b (Hib) vaccine. A previously unvaccinated person with asplenia or sickle cell disease or having a scheduled splenectomy should receive 1 dose of Hib vaccine. Regardless of previous immunization, a recipient of a hematopoietic stem cell transplant should receive a  3-dose series 6-12 months after her successful transplant. Hib vaccine is not recommended for adults with HIV infection. Preventive Services / Frequency Ages 35 to 4 years  Blood pressure check.** / Every 3-5 years.  Lipid and cholesterol check.** / Every 5 years beginning at age 60.  Clinical breast exam.** / Every 3 years for women in their 71s and 10s.  BRCA-related cancer risk assessment.** / For women who have family members with a BRCA-related cancer (breast, ovarian, tubal, or peritoneal cancers).  Pap test.** / Every 2 years from ages 76 through 26. Every 3 years starting at age 61 through age 76 or 93 with a history of 3 consecutive normal Pap tests.  HPV screening.** / Every 3 years from ages 37 through ages 60 to 51 with a history of 3 consecutive normal Pap tests.  Hepatitis C blood test.** / For any individual with known risks for hepatitis C.  Skin self-exam. / Monthly.  Influenza vaccine. / Every year.  Tetanus, diphtheria, and acellular pertussis (Tdap, Td) vaccine.** / Consult your health care provider. Pregnant women should receive 1 dose of Tdap vaccine during each pregnancy. 1 dose of Td every 10 years.  Varicella vaccine.** / Consult your health care provider. Pregnant females who do not have evidence of immunity should receive the first dose after pregnancy.  HPV vaccine. / 3 doses over 6 months, if 93 and younger. The vaccine is not recommended for use in pregnant females. However, pregnancy testing is not needed before receiving a dose.  Measles, mumps, rubella (MMR) vaccine.** / You need at least 1 dose of MMR if you were born in 1957 or later. You may also need a 2nd dose. For females of childbearing age, rubella immunity should be determined. If there is no evidence of immunity, females who are not pregnant should be vaccinated. If there is no evidence of immunity, females who are  pregnant should delay immunization until after pregnancy.  Pneumococcal  13-valent conjugate (PCV13) vaccine.** / Consult your health care provider.  Pneumococcal polysaccharide (PPSV23) vaccine.** / 1 to 2 doses if you smoke cigarettes or if you have certain conditions.  Meningococcal vaccine.** / 1 dose if you are age 68 to 8 years and a Market researcher living in a residence hall, or have one of several medical conditions, you need to get vaccinated against meningococcal disease. You may also need additional booster doses.  Hepatitis A vaccine.** / Consult your health care provider.  Hepatitis B vaccine.** / Consult your health care provider.  Haemophilus influenzae type b (Hib) vaccine.** / Consult your health care provider. Ages 7 to 53 years  Blood pressure check.** / Every year.  Lipid and cholesterol check.** / Every 5 years beginning at age 25 years.  Lung cancer screening. / Every year if you are aged 11-80 years and have a 30-pack-year history of smoking and currently smoke or have quit within the past 15 years. Yearly screening is stopped once you have quit smoking for at least 15 years or develop a health problem that would prevent you from having lung cancer treatment.  Clinical breast exam.** / Every year after age 48 years.  BRCA-related cancer risk assessment.** / For women who have family members with a BRCA-related cancer (breast, ovarian, tubal, or peritoneal cancers).  Mammogram.** / Every year beginning at age 41 years and continuing for as long as you are in good health. Consult with your health care provider.  Pap test.** / Every 3 years starting at age 65 years through age 37 or 70 years with a history of 3 consecutive normal Pap tests.  HPV screening.** / Every 3 years from ages 72 years through ages 60 to 40 years with a history of 3 consecutive normal Pap tests.  Fecal occult blood test (FOBT) of stool. / Every year beginning at age 21 years and continuing until age 5 years. You may not need to do this test if you get  a colonoscopy every 10 years.  Flexible sigmoidoscopy or colonoscopy.** / Every 5 years for a flexible sigmoidoscopy or every 10 years for a colonoscopy beginning at age 35 years and continuing until age 48 years.  Hepatitis C blood test.** / For all people born from [birth year] through 1965 and any individual with known risks for hepatitis C.  Skin self-exam. / Monthly.  Influenza vaccine. / Every year.  Tetanus, diphtheria, and acellular pertussis (Tdap/Td) vaccine.** / Consult your health care provider. Pregnant women should receive 1 dose of Tdap vaccine during each pregnancy. 1 dose of Td every 10 years.  Varicella vaccine.** / Consult your health care provider. Pregnant females who do not have evidence of immunity should receive the first dose after pregnancy.  Zoster vaccine.** / 1 dose for adults aged 30 years or older.  Measles, mumps, rubella (MMR) vaccine.** / You need at least 1 dose of MMR if you were born in 1957 or later. You may also need a second dose. For females of childbearing age, rubella immunity should be determined. If there is no evidence of immunity, females who are not pregnant should be vaccinated. If there is no evidence of immunity, females who are pregnant should delay immunization until after pregnancy.  Pneumococcal 13-valent conjugate (PCV13) vaccine.** / Consult your health care provider.  Pneumococcal polysaccharide (PPSV23) vaccine.** / 1 to 2 doses if you smoke cigarettes or if you have certain conditions.  Meningococcal vaccine.** /  Consult your health care provider.  Hepatitis A vaccine.** / Consult your health care provider.  Hepatitis B vaccine.** / Consult your health care provider.  Haemophilus influenzae type b (Hib) vaccine.** / Consult your health care provider. Ages 61 years and over  Blood pressure check.** / Every year.  Lipid and cholesterol check.** / Every 5 years beginning at age 79 years.  Lung cancer screening. / Every year if you  are aged 79-80 years and have a 30-pack-year history of smoking and currently smoke or have quit within the past 15 years. Yearly screening is stopped once you have quit smoking for at least 15 years or develop a health problem that would prevent you from having lung cancer treatment.  Clinical breast exam.** / Every year after age 44 years.  BRCA-related cancer risk assessment.** / For women who have family members with a BRCA-related cancer (breast, ovarian, tubal, or peritoneal cancers).  Mammogram.** / Every year beginning at age 82 years and continuing for as long as you are in good health. Consult with your health care provider.  Pap test.** / Every 3 years starting at age 59 years through age 54 or 22 years with 3 consecutive normal Pap tests. Testing can be stopped between 65 and 70 years with 3 consecutive normal Pap tests and no abnormal Pap or HPV tests in the past 10 years.  HPV screening.** / Every 3 years from ages 63 years through ages 72 or 84 years with a history of 3 consecutive normal Pap tests. Testing can be stopped between 65 and 70 years with 3 consecutive normal Pap tests and no abnormal Pap or HPV tests in the past 10 years.  Fecal occult blood test (FOBT) of stool. / Every year beginning at age 19 years and continuing until age 41 years. You may not need to do this test if you get a colonoscopy every 10 years.  Flexible sigmoidoscopy or colonoscopy.** / Every 5 years for a flexible sigmoidoscopy or every 10 years for a colonoscopy beginning at age 6 years and continuing until age 10 years.  Hepatitis C blood test.** / For all people born from [birth year] through 1965 and any individual with known risks for hepatitis C.  Osteoporosis screening.** / A one-time screening for women ages 4 years and over and women at risk for fractures or osteoporosis.  Skin self-exam. / Monthly.  Influenza vaccine. / Every year.  Tetanus, diphtheria, and acellular pertussis (Tdap/Td)  vaccine.** / 1 dose of Td every 10 years.  Varicella vaccine.** / Consult your health care provider.  Zoster vaccine.** / 1 dose for adults aged 13 years or older.  Pneumococcal 13-valent conjugate (PCV13) vaccine.** / Consult your health care provider.  Pneumococcal polysaccharide (PPSV23) vaccine.** / 1 dose for all adults aged 2 years and older.  Meningococcal vaccine.** / Consult your health care provider.  Hepatitis A vaccine.** / Consult your health care provider.  Hepatitis B vaccine.** / Consult your health care provider.  Haemophilus influenzae type b (Hib) vaccine.** / Consult your health care provider. ** Family history and personal history of risk and conditions may change your health care provider's recommendations.   This information is not intended to replace advice given to you by your health care provider. Make sure you discuss any questions you have with your health care provider.   Document Released: 11/10/2001 Document Revised: 10/05/2014 Document Reviewed: 02/09/2011 Elsevier Interactive Patient Education [employer].

## 2016-03-16 NOTE — Progress Notes (Signed)
Subjective:  Patient ID: Laura Guerra, female    DOB: [birth year]  Age: 30 y.o. MRN: [id number]  CC: Annual Exam   HPI Laura Guerra presents for a CPX.  She does not feel depressed anymore and has decided to stop taking Trintellix likes. She is seeing sports medicine for various locations of pain including her shoulder and throughout her back. She is taking meloxicam which has provided some symptom relief. Otherwise she feels well and offers no other complaints. She requested referral to a gynecologist for her to have an updated Pap smear performed.  Outpatient Prescriptions Prior to Visit  Medication Sig Dispense Refill  . Diclofenac Sodium (PENNSAID) 2 % SOLN Place 2 application onto the skin 2 (two) times daily. 112 g 3  . levonorgestrel (MIRENA) 20 MCG/24HR IUD 1 each by Intrauterine route once.    . meloxicam (MOBIC) 15 MG tablet TAKE 1 TABLET (15 MG TOTAL) BY MOUTH DAILY. 30 tablet 1  . Vortioxetine HBr (TRINTELLIX) 10 MG TABS Take 1 tablet (10 mg total) by mouth daily. (Patient not taking: Reported on 03/16/2016) 90 tablet 3   No facility-administered medications prior to visit.    ROS Review of Systems  Constitutional: Negative.  Negative for fever, chills, diaphoresis, appetite change and fatigue.  HENT: Negative.   Eyes: Negative.   Respiratory: Negative.  Negative for cough, choking, chest tightness, shortness of breath and stridor.   Cardiovascular: Negative.  Negative for chest pain, palpitations and leg swelling.  Gastrointestinal: Negative.  Negative for nausea, vomiting, abdominal pain, diarrhea and constipation.  Endocrine: Negative.   Genitourinary: Negative.  Negative for difficulty urinating.  Musculoskeletal: Positive for back pain, arthralgias and neck pain. Negative for myalgias.  Skin: Negative.  Negative for color change and rash.  Allergic/Immunologic: Negative.   Neurological: Negative.  Negative for dizziness, tremors, syncope,  light-headedness, numbness and headaches.  Hematological: Negative.  Negative for adenopathy. Does not bruise/bleed easily.  Psychiatric/Behavioral: Negative.     Objective:  BP 118/80 mmHg  Pulse 67  Temp(Src) 98.1 F (36.7 C) (Oral)  Resp 16  Ht  (1.651 m)  Wt 186 lb (84.369 kg)  BMI 30.95 kg/m2  SpO2 97%  BP Readings from Last 3 Encounters:  03/16/16 118/80  03/02/16 104/70  02/10/16 120/80    Wt Readings from Last 3 Encounters:  03/16/16 186 lb (84.369 kg)  03/02/16 184 lb (83.462 kg)  02/10/16 181 lb (82.101 kg)    Physical Exam  Constitutional: She is oriented to person, place, and time. She appears well-developed and well-nourished. No distress.  HENT:  Head: Normocephalic and atraumatic.  Mouth/Throat: Oropharynx is clear and moist. No oropharyngeal exudate.  Eyes: Conjunctivae are normal. Right eye exhibits no discharge. Left eye exhibits no discharge. No scleral icterus.  Neck: Normal range of motion. Neck supple. No JVD present. No tracheal deviation present. No thyromegaly present.  Cardiovascular: Normal rate, regular rhythm, normal heart sounds and intact distal pulses.  Exam reveals no gallop and no friction rub.   No murmur heard. Pulmonary/Chest: Effort normal and breath sounds normal. No stridor. No respiratory distress. She has no wheezes. She has no rales. She exhibits no tenderness.  Abdominal: Soft. Bowel sounds are normal. She exhibits no distension and no mass. There is no tenderness. There is no rebound and no guarding.  Musculoskeletal: Normal range of motion. She exhibits no edema or tenderness.  Lymphadenopathy:    She has no cervical adenopathy.  Neurological: She is oriented to person,  place, and time.  Skin: Skin is warm and dry. No rash noted. She is not diaphoretic. No erythema. No pallor.  Psychiatric: She has a normal mood and affect. Her behavior is normal. Judgment and thought content normal.  Vitals reviewed.   Lab Results    Component Value Date   WBC 9.1 03/16/2016   HGB 14.9 03/16/2016   HCT 43.2 03/16/2016   PLT 224.0 03/16/2016   GLUCOSE 67* 03/16/2016   CHOL 144 03/16/2016   TRIG 224.0* 03/16/2016   HDL 36.10* 03/16/2016   LDLDIRECT 83.0 03/16/2016   LDLCALC 67 03/11/2015   ALT 8 03/16/2016   AST 10 03/16/2016   NA 142 03/16/2016   K 3.8 03/16/2016   CL 106 03/16/2016   CREATININE 0.78 03/16/2016   BUN 13 03/16/2016   CO2 28 03/16/2016   TSH 3.64 03/16/2016    Dg Chest 2 View  05/30/2015  CLINICAL DATA:  Upper left-sided chest pain radiating to the back for the past 2 days, history of tobacco use EXAM: CHEST  2 VIEW COMPARISON:  None. FINDINGS: The lungs are mildly hyperinflated with hemidiaphragm flattening. There is no focal infiltrate. There is no pleural effusion. The heart and pulmonary vascularity are normal. The mediastinum is normal in width. The bony thorax is unremarkable. IMPRESSION: COPD.  There is no active cardiopulmonary disease. Electronically Signed   By: David  SwazilandJordan M.D.   On: 05/30/2015 10:55    Assessment & Plan:   Laura Guerra was seen today for annual exam.  Diagnoses and all orders for this visit:  Routine general medical examination at a health care facility- exam completed, labs ordered and reviewed, she refused a pneumonia vaccine today, will refer to GYN for cervical cancer screening, patient education material was given. -     Lipid panel; Future -     Comprehensive metabolic panel; Future -     CBC with Differential/Platelet; Future -     TSH; Future -     Urinalysis, Routine w reflex microscopic ([facility]); Future  Cervical cancer screening -     Ambulatory referral to Gynecology   I have discontinued Laura Guerra's vortioxetine HBr. I am also having her maintain her levonorgestrel, Diclofenac Sodium, and meloxicam.  No orders of the defined types were placed in this encounter.     Follow-up: Return if symptoms worsen or fail to improve.  Sanda Lingerhomas  Brayton Baumgartner, MD

## 2016-04-02 ENCOUNTER — Telehealth: Payer: Self-pay | Admitting: Obstetrics and Gynecology

## 2016-04-02 NOTE — Telephone Encounter (Signed)
Called and left a message for patient to call back to schedule a new patient doctor referral. °

## 2016-04-17 ENCOUNTER — Ambulatory Visit (INDEPENDENT_AMBULATORY_CARE_PROVIDER_SITE_OTHER): Payer: BLUE CROSS/BLUE SHIELD | Admitting: Obstetrics and Gynecology

## 2016-04-17 ENCOUNTER — Encounter: Payer: Self-pay | Admitting: Obstetrics and Gynecology

## 2016-04-17 VITALS — BP 120/76 | HR 76 | Resp 16 | Ht 65.0 in | Wt 183.0 lb

## 2016-04-17 DIAGNOSIS — R3129 Other microscopic hematuria: Secondary | ICD-10-CM | POA: Diagnosis not present

## 2016-04-17 DIAGNOSIS — F17200 Nicotine dependence, unspecified, uncomplicated: Secondary | ICD-10-CM

## 2016-04-17 DIAGNOSIS — Z0000 Encounter for general adult medical examination without abnormal findings: Secondary | ICD-10-CM

## 2016-04-17 DIAGNOSIS — Z01419 Encounter for gynecological examination (general) (routine) without abnormal findings: Secondary | ICD-10-CM

## 2016-04-17 LAB — POCT URINALYSIS DIPSTICK
BILIRUBIN UA: NEGATIVE
GLUCOSE UA: NEGATIVE
KETONES UA: NEGATIVE
Leukocytes, UA: NEGATIVE
Nitrite, UA: NEGATIVE
Protein, UA: NEGATIVE
Urobilinogen, UA: NEGATIVE
pH, UA: 6

## 2016-04-17 NOTE — Patient Instructions (Addendum)
Health Maintenance, Female Adopting a healthy lifestyle and getting preventive care can go a long way to promote health and wellness. Talk with your health care provider about what schedule of regular examinations is right for you. This is a good chance for you to check in with your provider about disease prevention and staying healthy. In between checkups, there are plenty of things you can do on your own. Experts have done a lot of research about which lifestyle changes and preventive measures are most likely to keep you healthy. Ask your health care provider for more information. WEIGHT AND DIET  Eat a healthy diet  Be sure to include plenty of vegetables, fruits, low-fat dairy products, and lean protein.  Do not eat a lot of foods high in solid fats, added sugars, or salt.  Get regular exercise. This is one of the most important things you can do for your health.  Most adults should exercise for at least 150 minutes each week. The exercise should increase your heart rate and make you sweat (moderate-intensity exercise).  Most adults should also do strengthening exercises at least twice a week. This is in addition to the moderate-intensity exercise.  Maintain a healthy weight  Body mass index (BMI) is a measurement that can be used to identify possible weight problems. It estimates body fat based on height and weight. Your health care provider can help determine your BMI and help you achieve or maintain a healthy weight.  For females 20 years of age and older:   A BMI below 18.5 is considered underweight.  A BMI of 18.5 to 24.9 is normal.  A BMI of 25 to 29.9 is considered overweight.  A BMI of 30 and above is considered obese.  Watch levels of cholesterol and blood lipids  You should start having your blood tested for lipids and cholesterol at 30 years of age, then have this test every 5 years.  You may need to have your cholesterol levels checked more often if:  Your lipid  or cholesterol levels are high.  You are older than 30 years of age.  You are at high risk for heart disease.  CANCER SCREENING   Lung Cancer  Lung cancer screening is recommended for adults 55-80 years old who are at high risk for lung cancer because of a history of smoking.  A yearly low-dose CT scan of the lungs is recommended for people who:  Currently smoke.  Have quit within the past 15 years.  Have at least a 30-pack-year history of smoking. A pack year is smoking an average of one pack of cigarettes a day for 1 year.  Yearly screening should continue until it has been 15 years since you quit.  Yearly screening should stop if you develop a health problem that would prevent you from having lung cancer treatment.  Breast Cancer  Practice breast self-awareness. This means understanding how your breasts normally appear and feel.  It also means doing regular breast self-exams. Let your health care provider know about any changes, no matter how small.  If you are in your 20s or 30s, you should have a clinical breast exam (CBE) by a health care provider every 1-3 years as part of a regular health exam.  If you are 40 or older, have a CBE every year. Also consider having a breast X-ray (mammogram) every year.  If you have a family history of breast cancer, talk to your health care provider about genetic screening.  If you   are at high risk for breast cancer, talk to your health care provider about having an MRI and a mammogram every year.  Breast cancer gene (BRCA) assessment is recommended for women who have family members with BRCA-related cancers. BRCA-related cancers include:  Breast.  Ovarian.  Tubal.  Peritoneal cancers.  Results of the assessment will determine the need for genetic counseling and BRCA1 and BRCA2 testing. Cervical Cancer Your health care provider may recommend that you be screened regularly for cancer of the pelvic organs (ovaries, uterus, and  vagina). This screening involves a pelvic examination, including checking for microscopic changes to the surface of your cervix (Pap test). You may be encouraged to have this screening done every 3 years, beginning at age 21.  For women ages 30-65, health care providers may recommend pelvic exams and Pap testing every 3 years, or they may recommend the Pap and pelvic exam, combined with testing for human papilloma virus (HPV), every 5 years. Some types of HPV increase your risk of cervical cancer. Testing for HPV may also be done on women of any age with unclear Pap test results.  Other health care providers may not recommend any screening for nonpregnant women who are considered low risk for pelvic cancer and who do not have symptoms. Ask your health care provider if a screening pelvic exam is right for you.  If you have had past treatment for cervical cancer or a condition that could lead to cancer, you need Pap tests and screening for cancer for at least 20 years after your treatment. If Pap tests have been discontinued, your risk factors (such as having a new sexual partner) need to be reassessed to determine if screening should resume. Some women have medical problems that increase the chance of getting cervical cancer. In these cases, your health care provider may recommend more frequent screening and Pap tests. Colorectal Cancer  This type of cancer can be detected and often prevented.  Routine colorectal cancer screening usually begins at 30 years of age and continues through 30 years of age.  Your health care provider may recommend screening at an earlier age if you have risk factors for colon cancer.  Your health care provider may also recommend using home test kits to check for hidden blood in the stool.  A small camera at the end of a tube can be used to examine your colon directly (sigmoidoscopy or colonoscopy). This is done to check for the earliest forms of colorectal  cancer.  Routine screening usually begins at age 50.  Direct examination of the colon should be repeated every 5-10 years through 30 years of age. However, you may need to be screened more often if early forms of precancerous polyps or small growths are found. Skin Cancer  Check your skin from head to toe regularly.  Tell your health care provider about any new moles or changes in moles, especially if there is a change in a mole's shape or color.  Also tell your health care provider if you have a mole that is larger than the size of a pencil eraser.  Always use sunscreen. Apply sunscreen liberally and repeatedly throughout the day.  Protect yourself by wearing long sleeves, pants, a wide-brimmed hat, and sunglasses whenever you are outside. HEART DISEASE, DIABETES, AND HIGH BLOOD PRESSURE   High blood pressure causes heart disease and increases the risk of stroke. High blood pressure is more likely to develop in:  People who have blood pressure in the high end   of the normal range (130-139/85-89 mm Hg).  People who are overweight or obese.  People who are African American.  If you are 38-23 years of age, have your blood pressure checked every 3-5 years. If you are 61 years of age or older, have your blood pressure checked every year. You should have your blood pressure measured twice--once when you are at a hospital or clinic, and once when you are not at a hospital or clinic. Record the average of the two measurements. To check your blood pressure when you are not at a hospital or clinic, you can use:  An automated blood pressure machine at a pharmacy.  A home blood pressure monitor.  If you are between 45 years and 39 years old, ask your health care provider if you should take aspirin to prevent strokes.  Have regular diabetes screenings. This involves taking a blood sample to check your fasting blood sugar level.  If you are at a normal weight and have a low risk for diabetes,  have this test once every three years after 30 years of age.  If you are overweight and have a high risk for diabetes, consider being tested at a younger age or more often. PREVENTING INFECTION  Hepatitis B  If you have a higher risk for hepatitis B, you should be screened for this virus. You are considered at high risk for hepatitis B if:  You were born in a country where hepatitis B is common. Ask your health care provider which countries are considered high risk.  Your parents were born in a high-risk country, and you have not been immunized against hepatitis B (hepatitis B vaccine).  You have HIV or AIDS.  You use needles to inject street drugs.  You live with someone who has hepatitis B.  You have had sex with someone who has hepatitis B.  You get hemodialysis treatment.  You take certain medicines for conditions, including cancer, organ transplantation, and autoimmune conditions. Hepatitis C  Blood testing is recommended for:  Everyone born from [birth year] through 1965.  Anyone with known risk factors for hepatitis C. Sexually transmitted infections (STIs)  You should be screened for sexually transmitted infections (STIs) including gonorrhea and chlamydia if:  You are sexually active and are younger than 30 years of age.  You are older than 30 years of age and your health care provider tells you that you are at risk for this type of infection.  Your sexual activity has changed since you were last screened and you are at an increased risk for chlamydia or gonorrhea. Ask your health care provider if you are at risk.  If you do not have HIV, but are at risk, it may be recommended that you take a prescription medicine daily to prevent HIV infection. This is called pre-exposure prophylaxis (PrEP). You are considered at risk if:  You are sexually active and do not regularly use condoms or know the HIV status of your partner(s).  You take drugs by injection.  You are sexually  active with a partner who has HIV. Talk with your health care provider about whether you are at high risk of being infected with HIV. If you choose to begin PrEP, you should first be tested for HIV. You should then be tested every 3 months for as long as you are taking PrEP.  PREGNANCY   If you are premenopausal and you may become pregnant, ask your health care provider about preconception counseling.  If you may  become pregnant, take 400 to 800 micrograms (mcg) of folic acid every day.  If you want to prevent pregnancy, talk to your health care provider about birth control (contraception). OSTEOPOROSIS AND MENOPAUSE   Osteoporosis is a disease in which the bones lose minerals and strength with aging. This can result in serious bone fractures. Your risk for osteoporosis can be identified using a bone density scan.  If you are 60 years of age or older, or if you are at risk for osteoporosis and fractures, ask your health care provider if you should be screened.  Ask your health care provider whether you should take a calcium or vitamin D supplement to lower your risk for osteoporosis.  Menopause may have certain physical symptoms and risks.  Hormone replacement therapy may reduce some of these symptoms and risks. Talk to your health care provider about whether hormone replacement therapy is right for you.  HOME CARE INSTRUCTIONS   Schedule regular health, dental, and eye exams.  Stay current with your immunizations.   Do not use any tobacco products including cigarettes, chewing tobacco, or electronic cigarettes.  If you are pregnant, do not drink alcohol.  If you are breastfeeding, limit how much and how often you drink alcohol.  Limit alcohol intake to no more than 1 drink per day for nonpregnant women. One drink equals 12 ounces of beer, 5 ounces of wine, or 1 ounces of hard liquor.  Do not use street drugs.  Do not share needles.  Ask your health care provider for help if  you need support or information about quitting drugs.  Tell your health care provider if you often feel depressed.  Tell your health care provider if you have ever been abused or do not feel safe at home.   This information is not intended to replace advice given to you by your health care provider. Make sure you discuss any questions you have with your health care provider.   Document Released: 03/30/2011 Document Revised: 10/05/2014 Document Reviewed: 08/16/2013 Elsevier Interactive Patient Education 2016 [employer].  Smoking Cessation, Tips for Success If you are ready to quit smoking, congratulations! You have chosen to help yourself be healthier. Cigarettes bring nicotine, tar, carbon monoxide, and other irritants into your body. Your lungs, heart, and blood vessels will be able to work better without these poisons. There are many different ways to quit smoking. Nicotine gum, nicotine patches, a nicotine inhaler, or nicotine nasal spray can help with physical craving. Hypnosis, support groups, and medicines help break the habit of smoking. WHAT THINGS CAN I DO TO MAKE QUITTING EASIER?  Here are some tips to help you quit for good:  Pick a date when you will quit smoking completely. Tell all of your friends and family about your plan to quit on that date.  Do not try to slowly cut down on the number of cigarettes you are smoking. Pick a quit date and quit smoking completely starting on that day.  Throw away all cigarettes.   Clean and remove all ashtrays from your home, work, and car.  On a card, write down your reasons for quitting. Carry the card with you and read it when you get the urge to smoke.  Cleanse your body of nicotine. Drink enough water and fluids to keep your urine clear or pale yellow. Do this after quitting to flush the nicotine from your body.  Learn to predict your moods. Do not let a bad situation be your excuse to have  a cigarette. Some situations in your life  might tempt you into wanting a cigarette.  Never have "just one" cigarette. It leads to wanting another and another. Remind yourself of your decision to quit.  Change habits associated with smoking. If you smoked while driving or when feeling stressed, try other activities to replace smoking. Stand up when drinking your coffee. Brush your teeth after eating. Sit in a different chair when you read the paper. Avoid alcohol while trying to quit, and try to drink fewer caffeinated beverages. Alcohol and caffeine may urge you to smoke.  Avoid foods and drinks that can trigger a desire to smoke, such as sugary or spicy foods and alcohol.  Ask people who smoke not to smoke around you.  Have something planned to do right after eating or having a cup of coffee. For example, plan to take a walk or exercise.  Try a relaxation exercise to calm you down and decrease your stress. Remember, you may be tense and nervous for the first 2 weeks after you quit, but this will pass.  Find new activities to keep your hands busy. Play with a pen, coin, or rubber band. Doodle or draw things on paper.  Brush your teeth right after eating. This will help cut down on the craving for the taste of tobacco after meals. You can also try mouthwash.   Use oral substitutes in place of cigarettes. Try using lemon drops, carrots, cinnamon sticks, or chewing gum. Keep them handy so they are available when you have the urge to smoke.  When you have the urge to smoke, try deep breathing.  Designate your home as a nonsmoking area.  If you are a heavy smoker, ask your health care provider about a prescription for nicotine chewing gum. It can ease your withdrawal from nicotine.  Reward yourself. Set aside the cigarette money you save and buy yourself something nice.  Look for support from others. Join a support group or smoking cessation program. Ask someone at home or at work to help you with your plan to quit smoking.  Always  ask yourself, "Do I need this cigarette or is this just a reflex?" Tell yourself, "Today, I choose not to smoke," or "I do not want to smoke." You are reminding yourself of your decision to quit.  Do not replace cigarette smoking with electronic cigarettes (commonly called e-cigarettes). The safety of e-cigarettes is unknown, and some may contain harmful chemicals.  If you relapse, do not give up! Plan ahead and think about what you will do the next time you get the urge to smoke. HOW WILL I FEEL WHEN I QUIT SMOKING? You may have symptoms of withdrawal because your body is used to nicotine (the addictive substance in cigarettes). You may crave cigarettes, be irritable, feel very hungry, cough often, get headaches, or have difficulty concentrating. The withdrawal symptoms are only temporary. They are strongest when you first quit but will go away within 10-14 days. When withdrawal symptoms occur, stay in control. Think about your reasons for quitting. Remind yourself that these are signs that your body is healing and getting used to being without cigarettes. Remember that withdrawal symptoms are easier to treat than the major diseases that smoking can cause.  Even after the withdrawal is over, expect periodic urges to smoke. However, these cravings are generally short lived and will go away whether you smoke or not. Do not smoke! WHAT RESOURCES ARE AVAILABLE TO HELP ME QUIT SMOKING? Your health care  provider can direct you to community resources or hospitals for support, which may include:  Group support.  Education.  Hypnosis.  Therapy.   This information is not intended to replace advice given to you by your health care provider. Make sure you discuss any questions you have with your health care provider.   Document Released: 06/12/2004 Document Revised: 10/05/2014 Document Reviewed: 03/02/2013 Elsevier Interactive Patient Education 2016 [employer].   Bupropion sustained-release tablets  (smoking cessation) What is this medicine? BUPROPION (byoo PROE pee on) is used to help people quit smoking. This medicine may be used for other purposes; ask your health care provider or pharmacist if you have questions. What should I tell my health care provider before I take this medicine? They need to know if you have any of these conditions: -an eating disorder, such as anorexia or bulimia -bipolar disorder or psychosis -diabetes or high blood sugar, treated with medication -glaucoma -head injury or brain tumor -heart disease, previous heart attack, or irregular heart beat -high blood pressure -kidney or liver disease -seizures -suicidal thoughts or a previous suicide attempt -Tourette's syndrome -weight loss -an unusual or allergic reaction to bupropion, other medicines, foods, dyes, or preservatives -breast-feeding -pregnant or trying to become pregnant How should I use this medicine? Take this medicine by mouth with a glass of water. Follow the directions on the prescription label. You can take it with or without food. If it upsets your stomach, take it with food. Do not cut, crush or chew this medicine. Take your medicine at regular intervals. If you take this medicine more than once a day, take your second dose at least 8 hours after you take your first dose. To limit difficulty in sleeping, avoid taking this medicine at bedtime. Do not take your medicine more often than directed. Do not stop taking this medicine suddenly except upon the advice of your doctor. Stopping this medicine too quickly may cause serious side effects. A special MedGuide will be given to you by the pharmacist with each prescription and refill. Be sure to read this information carefully each time. Talk to your pediatrician regarding the use of this medicine in children. Special care may be needed. Overdosage: If you think you have taken too much of this medicine contact a poison control center or emergency  room at once. NOTE: This medicine is only for you. Do not share this medicine with others. What if I miss a dose? If you miss a dose, skip the missed dose and take your next tablet at the regular time. There should be at least 8 hours between doses. Do not take double or extra doses. What may interact with this medicine? Do not take this medicine with any of the following medications: -linezolid -MAOIs like Azilect, Carbex, Eldepryl, Marplan, Nardil, and Parnate -methylene blue (injected into a vein) -other medicines that contain bupropion like Wellbutrin This medicine may also interact with the following medications: -alcohol -certain medicines for anxiety or sleep -certain medicines for blood pressure like metoprolol, propranolol -certain medicines for depression or psychotic disturbances -certain medicines for HIV or AIDS like efavirenz, lopinavir, nelfinavir, ritonavir -certain medicines for irregular heart beat like propafenone, flecainide -certain medicines for Parkinson's disease like amantadine, levodopa -certain medicines for seizures like carbamazepine, phenytoin, phenobarbital -cimetidine -clopidogrel -cyclophosphamide -furazolidone -isoniazid -nicotine -orphenadrine -procarbazine -steroid medicines like prednisone or cortisone -stimulant medicines for attention disorders, weight loss, or to stay awake -tamoxifen -theophylline -thiotepa -ticlopidine -tramadol -warfarin This list may not describe all possible interactions.  Give your health care provider a list of all the medicines, herbs, non-prescription drugs, or dietary supplements you use. Also tell them if you smoke, drink alcohol, or use illegal drugs. Some items may interact with your medicine. What should I watch for while using this medicine? Visit your doctor or health care professional for regular checks on your progress. This medicine should be used together with a patient support program. It is important to  participate in a behavioral program, counseling, or other support program that is recommended by your health care professional. Patients and their families should watch out for new or worsening thoughts of suicide or depression. Also watch out for sudden changes in feelings such as feeling anxious, agitated, panicky, irritable, hostile, aggressive, impulsive, severely restless, overly excited and hyperactive, or not being able to sleep. If this happens, especially at the beginning of treatment or after a change in dose, call your health care professional. Avoid alcoholic drinks while taking this medicine. Drinking excessive alcoholic beverages, using sleeping or anxiety medicines, or quickly stopping the use of these agents while taking this medicine may increase your risk for a seizure. Do not drive or use heavy machinery until you know how this medicine affects you. This medicine can impair your ability to perform these tasks. Do not take this medicine close to bedtime. It may prevent you from sleeping. Your mouth may get dry. Chewing sugarless gum or sucking hard candy, and drinking plenty of water may help. Contact your doctor if the problem does not go away or is severe. Do not use nicotine patches or chewing gum without the advice of your doctor or health care professional while taking this medicine. You may need to have your blood pressure taken regularly if your doctor recommends that you use both nicotine and this medicine together. What side effects may I notice from receiving this medicine? Side effects that you should report to your doctor or health care professional as soon as possible: -allergic reactions like skin rash, itching or hives, swelling of the face, lips, or tongue -breathing problems -changes in vision -confusion -fast or irregular heartbeat -hallucinations -increased blood pressure -redness, blistering, peeling or loosening of the skin, including inside the  mouth -seizures -suicidal thoughts or other mood changes -unusually weak or tired -vomiting Side effects that usually do not require medical attention (report to your doctor or health care professional if they continue or are bothersome): -change in sex drive or performance -constipation -headache -loss of appetite -nausea -tremors -weight loss This list may not describe all possible side effects. Call your doctor for medical advice about side effects. You may report side effects to FDA at 1-800-FDA-1088. Where should I keep my medicine? Keep out of the reach of children. Store at room temperature between 20 and 25 degrees C (68 and 77 degrees F). Protect from light. Keep container tightly closed. Throw away any unused medicine after the expiration date. NOTE: This sheet is a summary. It may not cover all possible information. If you have questions about this medicine, talk to your doctor, pharmacist, or health care provider.    2016, Elsevier/Gold Standard. (2013-05-12 10:55:10)

## 2016-04-17 NOTE — Progress Notes (Signed)
30 y.o. G80P2002  Married Caucasian female here for annual exam.    Has a Mirena IUD. Placed in Oct. 30, 2013. Very light menses occasionally.  No significant cramping.  Husband may get a vasectomy.   Hx of kidney infection.  No dysuria but left LLQ pressure.  Thought she had some blood in her urine today.  No hx of renal stones.  No nausea or vomiting.   Recent trial of medication from her PCP for depression which was not helpful so it was stopped.  Using Voltaren gel for shoulder pain.   Tobacco use.  Works for [employer].   PCP: Dr. Yetta Barre    No LMP recorded. Patient is not currently having periods (Reason: IUD).           Sexually active: Yes.    The current method of family planning is IUD.    Exercising: Yes.    Run, P90X, Core Strenght Training Smoker:  yes  Health Maintenance: Pap:  02/04/12 WNL History of abnormal Pap:  no TDaP:  09/2014 HIV: 2007 Screening Labs:  Hb today: , Urine today: ++ Blood, 6.0 pH.    reports that she has been smoking Cigarettes.  She has a 20 pack-year smoking history. She has never used smokeless tobacco. She reports that she drinks alcohol. She reports that she uses illicit drugs (Marijuana) about once per week.  Past Medical History  Diagnosis Date  . No pertinent past medical history   . Seasonal depression [facility])     Past Surgical History  Procedure Laterality Date  . No past surgeries    . Wisdom tooth extraction      Current Outpatient Prescriptions  Medication Sig Dispense Refill  . Diclofenac Sodium (PENNSAID) 2 % SOLN Place 2 application onto the skin 2 (two) times daily. 112 g 3  . levonorgestrel (MIRENA) 20 MCG/24HR IUD 1 each by Intrauterine route once.     No current facility-administered medications for this visit.    Family History  Problem Relation Age of Onset  . Cancer Father   . Cancer Maternal Grandmother   . Breast cancer Maternal Grandmother   . Cancer Paternal Uncle    Father had head and neck cancer.   Uncle had unknown cancer.   ROS:  Pertinent items are noted in HPI.  Otherwise, a comprehensive ROS was negative.  Exam:   BP 120/76 mmHg  Pulse 76  Resp 16  Ht  (1.651 m)  Wt 183 lb (83.008 kg)  BMI 30.45 kg/m2    General appearance: alert, cooperative and appears stated age Head: Normocephalic, without obvious abnormality, atraumatic Neck: no adenopathy, supple, symmetrical, trachea midline and thyroid normal to inspection and palpation Lungs: clear to auscultation bilaterally Breasts: normal appearance, no masses or tenderness, Inspection negative, No nipple retraction or dimpling, No nipple discharge or bleeding, No axillary or supraclavicular adenopathy Heart: regular rate and rhythm Abdomen: incisions:  No.    , soft, non-tender; no masses, no organomegaly Extremities: extremities normal, atraumatic, no cyanosis or edema Skin: Skin color, texture, turgor normal. No rashes or lesions Lymph nodes: Cervical, supraclavicular, and axillary nodes normal. No abnormal inguinal nodes palpated Neurologic: Grossly normal  Pelvic: External genitalia:  no lesions              Urethra:  normal appearing urethra with no masses, tenderness or lesions              Bartholins and Skenes: normal  Vagina: normal appearing vagina with normal color and discharge, no lesions              Cervix: no lesions.  IUD strings seen.               Pap taken: Yes.   Bimanual Exam:  Uterus:  normal size, contour, position, consistency, mobility, non-tender              Adnexa: normal adnexa and no mass, fullness, tenderness              Rectal exam: No..  Confirms.              Anus:  normal sphincter tone, no lesions  Chaperone was present for exam.  Assessment:   Well woman visit with normal exam. Smoker.   Seasonal affective disorder.  Mirena IUD patient.  Microscopic hematuria.   Plan: Yearly mammogram recommended after age 30.  Recommended self breast exam.  Pap and HR  HPV as above. Discussed Calcium, Vitamin D, regular exercise program including cardiovascular and weight bearing exercise. Labs performed.   Urine micro and culture.  Prescription medication(s) given.  No..  See orders. Referred to [facility] Smoking Cessation program and discussion of Wellbutrin for control of desire to smoke.  Patient very appreciative of the above information.  She will contact her PCP to determine if the Wellbutrin is appropriate for her depression and seasonal affective disorder so she can treat more than one issue at the same time.  Follow up annually and prn.       After visit summary provided.

## 2016-04-18 LAB — URINALYSIS, MICROSCOPIC ONLY
BACTERIA UA: NONE SEEN [HPF]
Casts: NONE SEEN [LPF]
Crystals: NONE SEEN [HPF]
RBC / HPF: NONE SEEN RBC/HPF (ref <=2)
WBC UA: NONE SEEN WBC/HPF (ref <=5)
YEAST: NONE SEEN [HPF]

## 2016-04-21 LAB — HM PAP SMEAR

## 2016-04-22 LAB — URINE CULTURE

## 2016-04-22 LAB — IPS PAP TEST WITH HPV

## 2016-04-23 ENCOUNTER — Telehealth: Payer: Self-pay

## 2016-04-23 MED ORDER — AMPICILLIN 250 MG PO CAPS: 250.0000 mg | ORAL_CAPSULE | Freq: Four times a day (QID) | ORAL | 0 refills | Status: DC

## 2016-04-23 MED ORDER — FLUCONAZOLE 150 MG PO TABS: 150.0000 mg | ORAL_TABLET | Freq: Once | ORAL | 0 refills | Status: AC

## 2016-04-23 NOTE — Telephone Encounter (Signed)
Spoke with patient and notified of Pos. Enterococcus on urine culture and will send in Rx for Ampicillin 250mg  #28,1 po q 6hrs and Rx for Diflucan 150mg  #2 if needed for yeast per Dr. Rica Records message. Also notified patient of pap smear results and next pap due in 3 years but AEX due yearly.

## 2016-04-23 NOTE — Telephone Encounter (Signed)
-----   Message from Patton Salles, MD sent at 04/23/2016  5:13 AM EDT ----- Please contact patient with final test results showing UTI with Enterococcus. I am recommending treatment with ampicillin 250 mg po q 6 hours for 7 days. Disp  - 28, RF none. Please also give an Rx for Diflucan 150 mg po x 1 with repeat in 72 hours if needed for possible yeast infection if it occurs due to the antibiotic. Disp - 2, RF none.  Her pap showed ASCUS but no dysplasia.  As the HR HPV test is negative, her next pap is due in 3 years.  (next well woman visit in 1 year) No further treatment or evaluation is needed for this pap result.   Cc- Claudette Laws

## 2016-05-23 NOTE — Progress Notes (Signed)
Tawana ScaleZach Smith D.O. [facility] [address] [place] Phone: [phone] Subjective:    I'm seeing this patient by the request  of:  Sanda Lingerhomas Jones, MD   CC: Back pain follow-up shoulder pain   AOZ:[id number]:Subjective  Aline AugustShannon N Mikel is a 30 y.o. female coming in with complaint of Back pain. Overall has been doing relatively well. States that she is feeling better. He is having some mild depression. We'll be talking to her primary care physician in the near future. Patient denies any suicidal or homicidal ideation. Patient states that the back pain seems to begin some tightness but has been working out more. Trying to lose weight. Eating more healthy which she think has made some improvement with some of her aches and pains.   Past Medical History:  Diagnosis Date  . No pertinent past medical history   . Seasonal depression (HCC)    Past Surgical History:  Procedure Laterality Date  . NO PAST SURGERIES    . WISDOM TOOTH EXTRACTION     Social History  Substance Use Topics  . Smoking status: Current Every Day Smoker    Packs/day: 1.00    Years: 20.00    Types: Cigarettes  . Smokeless tobacco: Never Used  . Alcohol use 0.0 - 0.6 oz/week     Comment: occasionally   No Known Allergies Family History  Problem Relation Age of Onset  . Cancer Father   . Cancer Maternal Grandmother   . Breast cancer Maternal Grandmother   . Cancer Paternal Uncle         Past medical history, social, surgical and family history all reviewed in electronic medical record.   Review of Systems: No headache, visual changes, nausea, vomiting, diarrhea, constipation, dizziness, abdominal pain, skin rash, fevers, chills, night sweats, weight loss, swollen lymph nodes, body aches, joint swelling, muscle aches, chest pain, shortness of breath, mood changes.   Objective  Blood pressure 102/70, pulse 65, weight 181 lb (82.1 kg), SpO2 99 %.  General: No apparent distress alert and oriented x3 mood  and affect normal, dressed appropriately.  HEENT: Pupils equal, extraocular movements intact  Respiratory: Patient's speak in full sentences and does not appear short of breath  Cardiovascular: No lower extremity edema, non tender, no erythema  Skin: Warm dry intact with no signs of infection or rash on extremities or on axial skeleton.  Abdomen: Soft nontender  Neuro: Cranial nerves II through XII are intact, neurovascularly intact in all extremities with 2+ DTRs and 2+ pulses.  Lymph: No lymphadenopathy of posterior or anterior cervical chain or axillae bilaterally.  Gait normal with good balance and coordination.  MSK:  Non tender with full range of motion and good stability and symmetric strength and tone of  elbows, wrist, hip, knee and ankles bilaterally.   Back Exam:  Inspection: Unremarkable  Motion: Flexion 30 deg, Extension 25 deg, Side Bending to 25 deg bilaterally,  Rotation to 45 deg bilaterally  SLR laying: Negative  XSLR laying: Negative  Palpable tenderness: Continued mild tightness in the thoracolumbar juncture FABER: Positive right side still present Sensory change: Gross sensation intact to all lumbar and sacral dermatomes.  Reflexes: 2+ at both patellar tendons, 2+ at achilles tendons, Babinski's downgoing.  Strength at foot  Plantar-flexion: 5/5 Dorsi-flexion: 5/5 Eversion: 4/5 Inversion: 5/5  Leg strength  Quad: 5/5 Hamstring: 5/5 Hip flexor: 4/5 Hip abductors: 4/5 with no change or improvement Gait unremarkable. Neck: Inspection unremarkable. No palpable stepoffs. Negative Spurling's maneuver. Tightness  of the trapezius bilaterally Grip strength and sensation normal in bilateral hands Strength good C4 to T1 distribution No sensory change to C4 to T1 Negative Hoffman sign bilaterally Reflexes normal  Osteopathic findings C2 flexed rotated and side bent right C4 flexed rotated and side bent left T3 extended rotated and side bent left with inhaled third  rib T5 extended rotated and side bent right L2 F RS left Sacrum right on right       Impression and Recommendations:     This case required medical decision making of moderate complexity.

## 2016-05-25 ENCOUNTER — Ambulatory Visit (INDEPENDENT_AMBULATORY_CARE_PROVIDER_SITE_OTHER): Payer: BLUE CROSS/BLUE SHIELD | Admitting: Family Medicine

## 2016-05-25 ENCOUNTER — Encounter: Payer: Self-pay | Admitting: Internal Medicine

## 2016-05-25 ENCOUNTER — Ambulatory Visit (INDEPENDENT_AMBULATORY_CARE_PROVIDER_SITE_OTHER): Payer: BLUE CROSS/BLUE SHIELD | Admitting: Internal Medicine

## 2016-05-25 ENCOUNTER — Encounter: Payer: Self-pay | Admitting: Family Medicine

## 2016-05-25 VITALS — BP 102/70 | HR 65 | Temp 98.6°F | Wt 181.0 lb

## 2016-05-25 VITALS — BP 102/70 | HR 65 | Wt 181.0 lb

## 2016-05-25 DIAGNOSIS — F329 Major depressive disorder, single episode, unspecified: Secondary | ICD-10-CM

## 2016-05-25 DIAGNOSIS — M549 Dorsalgia, unspecified: Secondary | ICD-10-CM | POA: Diagnosis not present

## 2016-05-25 DIAGNOSIS — M9903 Segmental and somatic dysfunction of lumbar region: Secondary | ICD-10-CM

## 2016-05-25 DIAGNOSIS — M999 Biomechanical lesion, unspecified: Secondary | ICD-10-CM

## 2016-05-25 DIAGNOSIS — M9904 Segmental and somatic dysfunction of sacral region: Secondary | ICD-10-CM

## 2016-05-25 DIAGNOSIS — M9902 Segmental and somatic dysfunction of thoracic region: Secondary | ICD-10-CM

## 2016-05-25 DIAGNOSIS — F32A Depression, unspecified: Secondary | ICD-10-CM

## 2016-05-25 DIAGNOSIS — N390 Urinary tract infection, site not specified: Secondary | ICD-10-CM

## 2016-05-25 DIAGNOSIS — F450 Somatization disorder: Secondary | ICD-10-CM | POA: Diagnosis not present

## 2016-05-25 MED ORDER — LEVOFLOXACIN 250 MG PO TABS: 250.0000 mg | ORAL_TABLET | Freq: Every day | ORAL | 0 refills | Status: AC

## 2016-05-25 MED ORDER — FLUCONAZOLE 150 MG PO TABS: 150.0000 mg | ORAL_TABLET | Freq: Once | ORAL | 3 refills | Status: AC

## 2016-05-25 MED ORDER — VENLAFAXINE HCL ER 37.5 MG PO CP24: 37.5000 mg | ORAL_CAPSULE | Freq: Every day | ORAL | 3 refills | Status: DC

## 2016-05-25 MED ORDER — BUPROPION HCL ER (XL) 150 MG PO TB24: 150.0000 mg | ORAL_TABLET | Freq: Every day | ORAL | 3 refills | Status: DC

## 2016-05-25 NOTE — Patient Instructions (Signed)
Major Depressive Disorder Major depressive disorder is a mental illness. It also may be called clinical depression or unipolar depression. Major depressive disorder usually causes feelings of sadness, hopelessness, or helplessness. Some people with this disorder do not feel particularly sad but lose interest in doing things they used to enjoy (anhedonia). Major depressive disorder also can cause physical symptoms. It can interfere with work, school, relationships, and other normal everyday activities. The disorder varies in severity but is longer lasting and more serious than the sadness we all feel from time to time in our lives. Major depressive disorder often is triggered by stressful life events or major life changes. Examples of these triggers include divorce, loss of your job or home, a move, and the death of a family member or close friend. Sometimes this disorder occurs for no obvious reason at all. People who have family members with major depressive disorder or bipolar disorder are at higher risk for developing this disorder, with or without life stressors. Major depressive disorder can occur at any age. It may occur just once in your life (single episode major depressive disorder). It may occur multiple times (recurrent major depressive disorder). SYMPTOMS People with major depressive disorder have either anhedonia or depressed mood on nearly a daily basis for at least 2 weeks or longer. Symptoms of depressed mood include:  Feelings of sadness (blue or down in the dumps) or emptiness.  Feelings of hopelessness or helplessness.  Tearfulness or episodes of crying (may be observed by others).  Irritability (children and adolescents). In addition to depressed mood or anhedonia or both, people with this disorder have at least four of the following symptoms:  Difficulty sleeping or sleeping too much.   Significant change (increase or decrease) in appetite or weight.   Lack of energy or  motivation.  Feelings of guilt and worthlessness.   Difficulty concentrating, remembering, or making decisions.  Unusually slow movement (psychomotor retardation) or restlessness (as observed by others).   Recurrent wishes for death, recurrent thoughts of self-harm (suicide), or a suicide attempt. People with major depressive disorder commonly have persistent negative thoughts about themselves, other people, and the world. People with severe major depressive disorder may experiencedistorted beliefs or perceptions about the world (psychotic delusions). They also may see or hear things that are not real (psychotic hallucinations). DIAGNOSIS Major depressive disorder is diagnosed through an assessment by your health care provider. Your health care provider will ask aboutaspects of your daily life, such as mood,sleep, and appetite, to see if you have the diagnostic symptoms of major depressive disorder. Your health care provider may ask about your medical history and use of alcohol or drugs, including prescription medicines. Your health care provider also may do a physical exam and blood work. This is because certain medical conditions and the use of certain substances can cause major depressive disorder-like symptoms (secondary depression). Your health care provider also may refer you to a mental health specialist for further evaluation and treatment. TREATMENT It is important to recognize the symptoms of major depressive disorder and seek treatment. The following treatments can be prescribed for this disorder:   Medicine. Antidepressant medicines usually are prescribed. Antidepressant medicines are thought to correct chemical imbalances in the brain that are commonly associated with major depressive disorder. Other types of medicine may be added if the symptoms do not respond to antidepressant medicines alone or if psychotic delusions or hallucinations occur.  Talk therapy. Talk therapy can be  helpful in treating major depressive disorder by providing   support, education, and guidance. Certain types of talk therapy also can help with negative thinking (cognitive behavioral therapy) and with relationship issues that trigger this disorder (interpersonal therapy). A mental health specialist can help determine which treatment is best for you. Most people with major depressive disorder do well with a combination of medicine and talk therapy. Treatments involving electrical stimulation of the brain can be used in situations with extremely severe symptoms or when medicine and talk therapy do not work over time. These treatments include electroconvulsive therapy, transcranial magnetic stimulation, and vagal nerve stimulation.   This information is not intended to replace advice given to you by your health care provider. Make sure you discuss any questions you have with your health care provider.   Document Released: 01/09/2013 Document Revised: 10/05/2014 Document Reviewed: 01/09/2013 Elsevier Interactive Patient Education 2016 [employer]  

## 2016-05-25 NOTE — Progress Notes (Signed)
Subjective:  Patient ID: Laura Guerra, female    DOB: [birth year]  Age: 30 y.o. MRN: [id number]  CC: Depression and Urinary Tract Infection   HPI Laura Guerra presents for a request to prescribe bupropion and venlafaxine to treat the combination of nicotine addiction as well as depression with anxiety and insomnia. In addition to bupropion she will use nicotine patches to help her quit smoking.  She is also concerned about some recent results that she got from a GYN visit. Her urine culture was positive for enterococcus and her vaginal specimen was positive for yeast. She tells me she did not take the ampicillin because it was prescribed 4 times a day and she just can't do that. She is requesting that I prescribe a once a day antibiotic and since she is going to take another course of antibiotics she wants another prescription for fluconazole.  Outpatient Medications Prior to Visit  Medication Sig Dispense Refill  . Diclofenac Sodium (PENNSAID) 2 % SOLN Place 2 application onto the skin 2 (two) times daily. 112 g 3  . levonorgestrel (MIRENA) 20 MCG/24HR IUD 1 each by Intrauterine route once.    Marland Kitchen ampicillin (PRINCIPEN) 250 MG capsule Take 1 capsule (250 mg total) by mouth 4 (four) times daily. (Patient not taking: Reported on 05/25/2016) 28 capsule 0   No facility-administered medications prior to visit.     ROS Review of Systems  Constitutional: Negative.  Negative for appetite change, chills, diaphoresis, fatigue and fever.  HENT: Negative.   Eyes: Negative.  Negative for visual disturbance.  Respiratory: Negative for cough, choking, chest tightness, shortness of breath and stridor.   Cardiovascular: Negative.  Negative for chest pain, palpitations and leg swelling.  Gastrointestinal: Negative for abdominal pain, constipation, diarrhea, nausea and vomiting.  Endocrine: Negative.   Genitourinary: Negative.  Negative for decreased urine volume, difficulty urinating,  dysuria, flank pain, frequency, hematuria, urgency and vaginal discharge.  Musculoskeletal: Negative.  Negative for back pain, myalgias and neck pain.  Skin: Negative.   Allergic/Immunologic: Negative.   Neurological: Negative.  Negative for dizziness, tremors, weakness, light-headedness, numbness and headaches.  Hematological: Negative.   Psychiatric/Behavioral: Positive for dysphoric mood and sleep disturbance. Negative for agitation, behavioral problems, confusion, decreased concentration, hallucinations, self-injury and suicidal ideas. The patient is nervous/anxious. The patient is not hyperactive.     Objective:  BP 102/70   Pulse 65   Temp 98.6 F (37 C) (Oral)   Wt 181 lb (82.1 kg)   SpO2 99%   BMI 30.12 kg/m   BP Readings from Last 3 Encounters:  05/25/16 102/70  05/25/16 102/70  04/17/16 120/76    Wt Readings from Last 3 Encounters:  05/25/16 181 lb (82.1 kg)  05/25/16 181 lb (82.1 kg)  04/17/16 183 lb (83 kg)    Physical Exam  Constitutional: She is oriented to person, place, and time. No distress.  HENT:  Mouth/Throat: Oropharynx is clear and moist. No oropharyngeal exudate.  Eyes: Conjunctivae are normal. Right eye exhibits no discharge. Left eye exhibits no discharge. No scleral icterus.  Neck: Normal range of motion. Neck supple. No JVD present. No tracheal deviation present. No thyromegaly present.  Cardiovascular: Normal rate, regular rhythm, normal heart sounds and intact distal pulses.  Exam reveals no gallop and no friction rub.   No murmur heard. Pulmonary/Chest: Effort normal and breath sounds normal. No stridor. No respiratory distress. She has no wheezes. She has no rales. She exhibits no tenderness.  Abdominal: Soft. Bowel sounds  are normal. She exhibits no distension and no mass. There is no tenderness. There is no rebound and no guarding.  Musculoskeletal: Normal range of motion. She exhibits no edema, tenderness or deformity.  Lymphadenopathy:     She has no cervical adenopathy.  Neurological: She is oriented to person, place, and time.  Skin: Skin is warm and dry. No rash noted. She is not diaphoretic. No erythema. No pallor.  Psychiatric: Judgment normal. Her mood appears anxious. Her affect is not angry, not blunt, not labile and not inappropriate. Her speech is not rapid and/or pressured, not delayed and not tangential. She is not aggressive, not slowed, not withdrawn and not combative. Cognition and memory are normal. She exhibits a depressed mood. She expresses no homicidal and no suicidal ideation. She expresses no suicidal plans and no homicidal plans. She is attentive.  Vitals reviewed.   Lab Results  Component Value Date   WBC 9.1 03/16/2016   HGB 14.9 03/16/2016   HCT 43.2 03/16/2016   PLT 224.0 03/16/2016   GLUCOSE 67 (L) 03/16/2016   CHOL 144 03/16/2016   TRIG 224.0 (H) 03/16/2016   HDL 36.10 (L) 03/16/2016   LDLDIRECT 83.0 03/16/2016   LDLCALC 67 03/11/2015   ALT 8 03/16/2016   AST 10 03/16/2016   NA 142 03/16/2016   K 3.8 03/16/2016   CL 106 03/16/2016   CREATININE 0.78 03/16/2016   BUN 13 03/16/2016   CO2 28 03/16/2016   TSH 3.64 03/16/2016    Dg Chest 2 View  Result Date: 05/30/2015 CLINICAL DATA:  Upper left-sided chest pain radiating to the back for the past 2 days, history of tobacco use EXAM: CHEST  2 VIEW COMPARISON:  None. FINDINGS: The lungs are mildly hyperinflated with hemidiaphragm flattening. There is no focal infiltrate. There is no pleural effusion. The heart and pulmonary vascularity are normal. The mediastinum is normal in width. The bony thorax is unremarkable. IMPRESSION: COPD.  There is no active cardiopulmonary disease. Electronically Signed   By: David  [place] M.D.   On: 05/30/2015 10:55    Assessment & Plan:   Jaylani was seen today for depression and urinary tract infection.  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 She will add nicotine patches for smoking cessation. I will monitor her response to these meds at these doses over the next month or 2 and then will increase the dose if indicated. -     buPROPion (WELLBUTRIN XL) 150 MG 24 hr tablet; Take 1 tablet (150 mg total) by mouth daily. -     venlafaxine XR (EFFEXOR-XR) 37.5 MG 24 hr capsule; Take 1 capsule (37.5 mg total) by mouth daily with breakfast.  UTI (lower urinary tract infection)- since she will not take ampicillin 4 times a day I will try a 5 day course of Levaquin and will also offer fluconazole to prevent a vaginal yeast infection. -     levofloxacin (LEVAQUIN) 250 MG tablet; Take 1 tablet (250 mg total) by mouth daily. -     fluconazole (DIFLUCAN) 150 MG tablet; Take 1 tablet (150 mg total) by mouth once.   I have discontinued Ms. Rougeau's ampicillin. I am also having her start on buPROPion, venlafaxine XR, levofloxacin, and fluconazole. Additionally, I am having her maintain her levonorgestrel and Diclofenac Sodium.  Meds ordered this encounter  Medications  . buPROPion (WELLBUTRIN XL) 150 MG 24 hr tablet    Sig: Take  1 tablet (150 mg total) by mouth daily.    Dispense:  90 tablet    Refill:  3  . venlafaxine XR (EFFEXOR-XR) 37.5 MG 24 hr capsule    Sig: Take 1 capsule (37.5 mg total) by mouth daily with breakfast.    Dispense:  90 capsule    Refill:  3  . levofloxacin (LEVAQUIN) 250 MG tablet    Sig: Take 1 tablet (250 mg total) by mouth daily.    Dispense:  5 tablet    Refill:  0  . fluconazole (DIFLUCAN) 150 MG tablet    Sig: Take 1 tablet (150 mg total) by mouth once.    Dispense:  1 tablet    Refill:  3     Follow-up: Return in about 6 weeks (around 07/06/2016).  Sanda Lingerhomas Cristan Scherzer, MD

## 2016-05-25 NOTE — Progress Notes (Signed)
Pre visit review using our clinic review tool, if applicable. No additional management support is needed unless otherwise documented below in the visit note. 

## 2016-05-25 NOTE — Assessment & Plan Note (Signed)
Patient seems to be doing better. 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 Patient also work on posture. I do think that she will do well overall. Has topical anti-inflammatories for breakthrough pain. We discussed icing regimen. Follow-up again in 2-3 months.

## 2016-05-25 NOTE — Assessment & Plan Note (Signed)
Decision today to treat with OMT was based on Physical Exam  After verbal consent patient was treated with HVLA, ME techniques in Cervical, thoracic, lumbnar and sacral areas  Patient tolerated the procedure well with improvement in symptoms  Patient given exercises, stretches and lifestyle modifications  See medications in patient instructions if given  Patient will follow up in 8-12 weeks

## 2016-05-25 NOTE — Assessment & Plan Note (Signed)
Patient feels comfortable with primary care provider and will be discussing with him about different treatment options.

## 2016-05-25 NOTE — Patient Instructions (Addendum)
Good to see you  Laura Guerra is your friend' Keep working on the posture.  Overall I think you are doing well  Ask about wellbutrin, effexor or vibryd.  See me again a in 3-4 weeks.

## 2016-05-29 ENCOUNTER — Encounter: Payer: Self-pay | Admitting: Internal Medicine

## 2016-05-29 ENCOUNTER — Ambulatory Visit (INDEPENDENT_AMBULATORY_CARE_PROVIDER_SITE_OTHER): Payer: BLUE CROSS/BLUE SHIELD | Admitting: Internal Medicine

## 2016-05-29 VITALS — BP 110/76 | HR 68 | Temp 98.1°F | Resp 16 | Ht 65.0 in | Wt 181.0 lb

## 2016-05-29 DIAGNOSIS — M26609 Unspecified temporomandibular joint disorder, unspecified side: Secondary | ICD-10-CM | POA: Diagnosis not present

## 2016-05-29 DIAGNOSIS — N3001 Acute cystitis with hematuria: Secondary | ICD-10-CM

## 2016-05-29 DIAGNOSIS — R300 Dysuria: Secondary | ICD-10-CM | POA: Diagnosis not present

## 2016-05-29 LAB — POCT URINALYSIS DIPSTICK
Bilirubin, UA: NEGATIVE
GLUCOSE UA: NEGATIVE
Nitrite, UA: NEGATIVE
PROTEIN UA: NEGATIVE
Spec Grav, UA: >=1.03
Urobilinogen, UA: NEGATIVE
pH, UA: 6

## 2016-05-29 MED ORDER — SULFAMETHOXAZOLE-TRIMETHOPRIM 800-160 MG PO TABS: 1.0000 | ORAL_TABLET | Freq: Two times a day (BID) | ORAL | 0 refills | Status: DC

## 2016-05-29 NOTE — Assessment & Plan Note (Signed)
Small swelling of the right TMJ, given information and asked to ice. No imaging or medications needed today.

## 2016-05-29 NOTE — Patient Instructions (Addendum)
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 You can ice this area to help and avoid chewing gum to avoid further inflammation.   We have sent in bactrim for the urinary infection. Take 1 pill twice a day for 3 days then stop.  Temporomandibular Joint Syndrome Temporomandibular joint (TMJ) syndrome is a condition that affects the joints between your jaw and your skull. The TMJs are located near your ears and allow your jaw to open and close. These joints and the nearby muscles are involved in all movements of the jaw. People with TMJ syndrome have pain in the area of these joints and muscles. Chewing, biting, or other movements of the jaw can be difficult or painful. TMJ syndrome can be caused by various things. In many cases, the condition is mild and goes away within a few weeks. For some people, the condition can become a long-term problem. CAUSES Possible causes of TMJ syndrome include:  Grinding your teeth or clenching your jaw. Some people do this when they are under stress.  Arthritis.  Injury to the jaw.  Head or neck injury.  Teeth or dentures that are not aligned well. In some cases, the cause of TMJ syndrome may not be known. SIGNS AND SYMPTOMS The most common symptom is an aching pain on the side of the head in the area of the TMJ. Other symptoms may include:  Pain when moving your jaw, such as when chewing or biting.  Being unable to open your jaw all the way.  Making a clicking sound when you open your mouth.  Headache.  Earache.  Neck or shoulder pain. DIAGNOSIS Diagnosis can usually be made based on your symptoms, your medical history, and a physical exam. Your health care provider may check the range of motion of your jaw. Imaging tests, such as X-rays or an MRI, are sometimes done. You may need to see your dentist to determine if your teeth and jaw are lined up correctly. TREATMENT TMJ syndrome often goes away on its own. If treatment is needed, the options may  include:  Eating soft foods and applying ice or heat.  Medicines to relieve pain or inflammation.  Medicines to relax the muscles.  A splint, bite plate, or mouthpiece to prevent teeth grinding or jaw clenching.  Relaxation techniques or counseling to help reduce stress.  Transcutaneous electrical nerve stimulation (TENS). This helps to relieve pain by applying an electrical current through the skin.  Acupuncture. This is sometimes helpful to relieve pain.  Jaw surgery. This is rarely needed. HOME CARE INSTRUCTIONS  Take medicines only as directed by your health care provider.  Eat a soft diet if you are having trouble chewing.  Apply ice to the painful area.  Put ice in a plastic bag.  Place a towel between your skin and the bag.  Leave the ice on for 20 minutes, 2-3 times a day.  Apply a warm compress to the painful area as directed.  Massage your jaw area and perform any jaw stretching exercises as recommended by your health care provider.  If you were given a mouthpiece or bite plate, wear it as directed.  Avoid foods that require a lot of chewing. Do not chew gum.  Keep all follow-up visits as directed by your health care provider. This is important. SEEK MEDICAL CARE IF:  You are having trouble eating.  You have new or worsening symptoms. SEEK IMMEDIATE MEDICAL CARE IF:  Your jaw locks open or closed.   This information is  not intended to replace advice given to you by your health care provider. Make sure you discuss any questions you have with your health care provider.   Document Released: 06/09/2001 Document Revised: 10/05/2014 Document Reviewed: 04/19/2014 Elsevier Interactive Patient Education [employer]2016 [employer]

## 2016-05-29 NOTE — Progress Notes (Signed)
   Subjective:    Patient ID: Laura Guerra, female    DOB: Jan 24, 1986, 30 y.o.   MRN: [id number]  HPI The patient is a 30 YO female coming in for pain in the right jaw with some prominence. She is concerned about the swelling. She denies weight loss. She is a current smoker. No chewing tobacco. Also wants her urine rechecked because she saw a gynecologist and they checked her and treated her with an antibiotic QID (likely keflex) which she did not take as directed.   Review of Systems  Constitutional: Negative.   HENT: Positive for congestion. Negative for mouth sores, nosebleeds, postnasal drip, rhinorrhea and sinus pressure.        Some jaw pain  Eyes: Negative.   Respiratory: Negative.   Cardiovascular: Negative.   Gastrointestinal: Negative.   Genitourinary: Positive for frequency and urgency. Negative for dysuria.  Musculoskeletal: Negative.   Neurological: Negative.       Objective:   Physical Exam  Constitutional: She is oriented to person, place, and time. She appears well-developed and well-nourished.  HENT:  Head: Normocephalic and atraumatic.  Right Ear: External ear normal.  Left Ear: External ear normal.  Mouth/Throat: Oropharynx is clear and moist.  Some mild erythema oropharynx. Mild right TMJ pain with some prominence.   Eyes: EOM are normal.  Neck: Normal range of motion.  Cardiovascular: Normal rate and regular rhythm.   Pulmonary/Chest: Effort normal and breath sounds normal.  Abdominal: Soft. She exhibits no distension. There is no tenderness. There is no rebound.  Lymphadenopathy:    She has no cervical adenopathy.  Neurological: She is alert and oriented to person, place, and time.  Skin: Skin is warm and dry.   Vitals:   05/29/16 1621  BP: 110/76  Pulse: 68  Resp: 16  Temp: 98.1 F (36.7 C)  TempSrc: Oral  SpO2: 99%  Weight: 181 lb (82.1 kg)  Height: 5\' 5"  (1.651 m)      Assessment & Plan:

## 2016-05-29 NOTE — Assessment & Plan Note (Signed)
Rx for bactrim for 3 days to treat the infection. She was not able to take the (presumed) keflex QID.

## 2016-05-29 NOTE — Progress Notes (Signed)
Pre visit review using our clinic review tool, if applicable. No additional management support is needed unless otherwise documented below in the visit note. 

## 2016-10-08 ENCOUNTER — Ambulatory Visit: Payer: BLUE CROSS/BLUE SHIELD | Admitting: Family

## 2016-10-08 ENCOUNTER — Ambulatory Visit (INDEPENDENT_AMBULATORY_CARE_PROVIDER_SITE_OTHER): Payer: BLUE CROSS/BLUE SHIELD | Admitting: Internal Medicine

## 2016-10-08 ENCOUNTER — Encounter: Payer: Self-pay | Admitting: Internal Medicine

## 2016-10-08 DIAGNOSIS — S46812A Strain of other muscles, fascia and tendons at shoulder and upper arm level, left arm, initial encounter: Secondary | ICD-10-CM | POA: Diagnosis not present

## 2016-10-08 MED ORDER — TRAMADOL HCL 50 MG PO TABS: 50.0000 mg | ORAL_TABLET | Freq: Three times a day (TID) | ORAL | 0 refills | Status: DC | PRN

## 2016-10-08 MED ORDER — CYCLOBENZAPRINE HCL 5 MG PO TABS: 5.0000 mg | ORAL_TABLET | Freq: Three times a day (TID) | ORAL | 1 refills | Status: DC | PRN

## 2016-10-08 MED ORDER — MELOXICAM 15 MG PO TABS: 15.0000 mg | ORAL_TABLET | Freq: Every day | ORAL | 1 refills | Status: DC

## 2016-10-08 NOTE — Progress Notes (Signed)
   Subjective:    Patient ID: Laura Guerra, female    DOB: August 11, 1986, 31 y.o.   MRN: [id number]  HPI  Here to f/u with c/o left upper back pain after working out 4 days ago with wts and upper body excercises; then onset constant moderate pain to left upper back with swelling, worse to turn head to the right, and abduct the shoulder but not forward elevation of the shoulder, shoulder also has normal ROM Pt denies chest pain, increased sob or doe, wheezing, orthopnea, PND, increased LE swelling, palpitations, dizziness or syncope.  Pt denies new neurological symptoms such as new headache, or facial or extremity weakness or numbness   Pt denies polydipsia, polyuria   Pt denies fever, wt loss, night sweats, loss of appetite, or other constitutional symptoms No other new history findings Past Medical History:  Diagnosis Date  . No pertinent past medical history   . Seasonal depression (HCC)    Past Surgical History:  Procedure Laterality Date  . NO PAST SURGERIES    . WISDOM TOOTH EXTRACTION      reports that she has been smoking Cigarettes.  She has a 20.00 pack-year smoking history. She has never used smokeless tobacco. She reports that she drinks alcohol. She reports that she uses drugs, including Marijuana, about 1 time per week. family history includes Breast cancer in her maternal grandmother; Cancer in her father, maternal grandmother, and paternal uncle. Allergies  Allergen Reactions  . Pennsaid [Diclofenac Sodium]     Local rash   Current Outpatient Prescriptions on File Prior to Visit  Medication Sig Dispense Refill  . sulfamethoxazole-trimethoprim (BACTRIM DS,SEPTRA DS) 800-160 MG tablet Take 1 tablet by mouth 2 (two) times daily. 6 tablet 0  . buPROPion (WELLBUTRIN XL) 150 MG 24 hr tablet Take 1 tablet (150 mg total) by mouth daily. (Patient not taking: Reported on 10/08/2016) 90 tablet 3  . levonorgestrel (MIRENA) 20 MCG/24HR IUD 1 each by Intrauterine route once.    .  venlafaxine XR (EFFEXOR-XR) 37.5 MG 24 hr capsule Take 1 capsule (37.5 mg total) by mouth daily with breakfast. (Patient not taking: Reported on 10/08/2016) 90 capsule 3   No current facility-administered medications on file prior to visit.    Review of Systems All otherwise neg per pt     Objective:   Physical Exam BP 126/78   Pulse 76   Temp 98 F (36.7 C) (Oral)   Resp 20   SpO2 97%  VS noted,  Constitutional: Pt appears in no apparent distress HENT: Head: NCAT.  Right Ear: External ear normal.  Left Ear: External ear normal.  Eyes: . Pupils are equal, round, and reactive to light. Conjunctivae and EOM are normal Neck: Normal range of motion. Neck supple.  Cardiovascular: Normal rate and regular rhythm.   Pulmonary/Chest: Effort normal and breath sounds without rales or wheezing.  Spine nontender in midline throughout Left trapezoid muscle area with nondiscrete diffuse swelling and tenderness without skin change, ulcer or drainage Neurological: Pt is alert. Not confused , motor grossly intact Skin: Skin is warm. No rash, no LE edema Psychiatric: Pt behavior is normal. No agitation.  No other new exam findings    Assessment & Plan:

## 2016-10-08 NOTE — Patient Instructions (Signed)
Please take all new medication as prescribed - the antiinflammatory, tramadol for pain, and muscle relaxer as needed  You are given the work note  OK to stop the Pennsaid  Please continue all other medications as before, and refills have been done if requested.  Please have the pharmacy call with any other refills you may need.  Please keep your appointments with your specialists as you may have planned

## 2016-10-08 NOTE — Progress Notes (Signed)
Pre visit review using our clinic review tool, if applicable. No additional management support is needed unless otherwise documented below in the visit note. 

## 2016-10-08 NOTE — Progress Notes (Signed)
Letter done

## 2016-10-08 NOTE — Assessment & Plan Note (Signed)
Mild to mod, for pain control, nsaid prn, muscle relaxer prn, would hold on PT during acute injury phase,  to f/u any worsening symptoms or concerns

## 2016-10-30 ENCOUNTER — Encounter: Payer: Self-pay | Admitting: Nurse Practitioner

## 2016-10-30 ENCOUNTER — Ambulatory Visit (INDEPENDENT_AMBULATORY_CARE_PROVIDER_SITE_OTHER): Payer: BLUE CROSS/BLUE SHIELD | Admitting: Nurse Practitioner

## 2016-10-30 VITALS — BP 110/78 | HR 70 | Temp 97.6°F | Wt 199.0 lb

## 2016-10-30 DIAGNOSIS — J069 Acute upper respiratory infection, unspecified: Secondary | ICD-10-CM

## 2016-10-30 LAB — POCT INFLUENZA A/B
INFLUENZA B, POC: NEGATIVE
Influenza A, POC: NEGATIVE

## 2016-10-30 MED ORDER — IPRATROPIUM BROMIDE 0.03 % NA SOLN: 2.0000 | Freq: Two times a day (BID) | NASAL | 0 refills | Status: DC

## 2016-10-30 MED ORDER — HYDROCODONE-HOMATROPINE 5-1.5 MG/5ML PO SYRP: 5.0000 mL | ORAL_SOLUTION | Freq: Every evening | ORAL | 0 refills | Status: DC | PRN

## 2016-10-30 MED ORDER — ALBUTEROL SULFATE HFA 108 (90 BASE) MCG/ACT IN AERS: 2.0000 | INHALATION_SPRAY | Freq: Four times a day (QID) | RESPIRATORY_TRACT | 0 refills | Status: DC | PRN

## 2016-10-30 MED ORDER — DM-GUAIFENESIN ER 30-600 MG PO TB12: 1.0000 | ORAL_TABLET | Freq: Two times a day (BID) | ORAL | 0 refills | Status: DC | PRN

## 2016-10-30 NOTE — Patient Instructions (Addendum)
Sweet oil for itchy ears.  Encourage adequate oral hydration.  Use over-the-counter  "cold" medicines  such as "Tylenol cold" , "Advil cold",  "Mucinex" or" Mucinex D"  for cough and congestion.  Avoid decongestants if you have high blood pressure. Use" Delsym" or" Robitussin" cough syrup varietis for cough.  You can use plain "Tylenol" or "Advi"l for fever, chills and achyness.   "Common cold" symptoms are usually triggered by a virus.  The antibiotics are usually not necessary. On average, a" viral cold" illness would take 4-7 days to resolve. Please, make an appointment if you are not better or if you're worse.

## 2016-10-30 NOTE — Progress Notes (Signed)
Subjective:  Patient ID: Laura Guerra, female    DOB: 09-16-1986  Age: 31 y.o. MRN: [id number]  CC: Cough (coughing,bodyache, ears itches, uvula discolor per pt for 3 days. )   Cough  This is a new problem. The current episode started in the past 7 days. The problem has been unchanged. The problem occurs constantly. The cough is non-productive. Associated symptoms include chills, ear congestion, ear pain, a fever, headaches, myalgias, nasal congestion, postnasal drip, rhinorrhea, a sore throat, shortness of breath and wheezing. Pertinent negatives include no chest pain, hemoptysis, rash, sweats or weight loss. The symptoms are aggravated by lying down and cold air. Risk factors for lung disease include smoking/tobacco exposure. She has tried nothing for the symptoms.    Outpatient Medications Prior to Visit  Medication Sig Dispense Refill  . buPROPion (WELLBUTRIN XL) 150 MG 24 hr tablet Take 1 tablet (150 mg total) by mouth daily. 90 tablet 3  . cyclobenzaprine (FLEXERIL) 5 MG tablet Take 1 tablet (5 mg total) by mouth 3 (three) times daily as needed for muscle spasms. 40 tablet 1  . levonorgestrel (MIRENA) 20 MCG/24HR IUD 1 each by Intrauterine route once.    . meloxicam (MOBIC) 15 MG tablet Take 1 tablet (15 mg total) by mouth daily. As needed for pain 30 tablet 1  . traMADol (ULTRAM) 50 MG tablet Take 1 tablet (50 mg total) by mouth every 8 (eight) hours as needed. 40 tablet 0  . sulfamethoxazole-trimethoprim (BACTRIM DS,SEPTRA DS) 800-160 MG tablet Take 1 tablet by mouth 2 (two) times daily. (Patient not taking: Reported on 10/30/2016) 6 tablet 0  . venlafaxine XR (EFFEXOR-XR) 37.5 MG 24 hr capsule Take 1 capsule (37.5 mg total) by mouth daily with breakfast. (Patient not taking: Reported on 10/08/2016) 90 capsule 3   No facility-administered medications prior to visit.     ROS See HPI  Objective:  BP 110/78   Pulse 70   Temp 97.6 F (36.4 C)   Wt 199 lb (90.3 kg)   SpO2  97%   BMI 33.12 kg/m   BP Readings from Last 3 Encounters:  10/30/16 110/78  10/08/16 126/78  05/29/16 110/76    Wt Readings from Last 3 Encounters:  10/30/16 199 lb (90.3 kg)  05/29/16 181 lb (82.1 kg)  05/25/16 181 lb (82.1 kg)    Physical Exam  Constitutional: She is oriented to person, place, and time.  HENT:  Right Ear: Tympanic membrane, external ear and ear canal normal.  Left Ear: Tympanic membrane, external ear and ear canal normal.  Nose: Mucosal edema and rhinorrhea present. Right sinus exhibits maxillary sinus tenderness. Right sinus exhibits no frontal sinus tenderness. Left sinus exhibits maxillary sinus tenderness. Left sinus exhibits no frontal sinus tenderness.  Mouth/Throat: Uvula is midline. No trismus in the jaw. Posterior oropharyngeal erythema present. No oropharyngeal exudate.  Eyes: No scleral icterus.  Neck: Normal range of motion. Neck supple.  Cardiovascular: Normal rate and normal heart sounds.   Pulmonary/Chest: Effort normal. She has wheezes. She has no rales.  Musculoskeletal: She exhibits no edema.  Lymphadenopathy:    She has cervical adenopathy.  Neurological: She is alert and oriented to person, place, and time.  Vitals reviewed.   Lab Results  Component Value Date   WBC 9.1 03/16/2016   HGB 14.9 03/16/2016   HCT 43.2 03/16/2016   PLT 224.0 03/16/2016   GLUCOSE 67 (L) 03/16/2016   CHOL 144 03/16/2016   TRIG 224.0 (H) 03/16/2016   HDL  36.10 (L) 03/16/2016   LDLDIRECT 83.0 03/16/2016   LDLCALC 67 03/11/2015   ALT 8 03/16/2016   AST 10 03/16/2016   NA 142 03/16/2016   K 3.8 03/16/2016   CL 106 03/16/2016   CREATININE 0.78 03/16/2016   BUN 13 03/16/2016   CO2 28 03/16/2016   TSH 3.64 03/16/2016    Dg Chest 2 View  Result Date: 05/30/2015 CLINICAL DATA:  Upper left-sided chest pain radiating to the back for the past 2 days, history of tobacco use EXAM: CHEST  2 VIEW COMPARISON:  None. FINDINGS: The lungs are mildly hyperinflated  with hemidiaphragm flattening. There is no focal infiltrate. There is no pleural effusion. The heart and pulmonary vascularity are normal. The mediastinum is normal in width. The bony thorax is unremarkable. IMPRESSION: COPD.  There is no active cardiopulmonary disease. Electronically Signed   By: David  [place] M.D.   On: 05/30/2015 10:55    Assessment & Plan:   Teva was seen today for cough.  Diagnoses and all orders for this visit:  Acute URI -     POCT Influenza A/B -     ipratropium (ATROVENT) 0.03 % nasal spray; Place 2 sprays into both nostrils 2 (two) times daily. Do not use for more than 5days. -     dextromethorphan-guaiFENesin (MUCINEX DM) 30-600 MG 12hr tablet; Take 1 tablet by mouth 2 (two) times daily as needed for cough. -     HYDROcodone-homatropine (HYCODAN) 5-1.5 MG/5ML syrup; Take 5 mLs by mouth at bedtime as needed for cough. -     albuterol (PROVENTIL HFA;VENTOLIN HFA) 108 (90 Base) MCG/ACT inhaler; Inhale 2 puffs into the lungs every 6 (six) hours as needed for wheezing or shortness of breath.   I have discontinued Ms. Biby's venlafaxine XR and sulfamethoxazole-trimethoprim. I am also having her start on ipratropium, dextromethorphan-guaiFENesin, HYDROcodone-homatropine, and albuterol. Additionally, I am having her maintain her levonorgestrel, buPROPion, meloxicam, cyclobenzaprine, traMADol, and TRINTELLIX.  Meds ordered this encounter  Medications  . TRINTELLIX 10 MG TABS    Sig: Take 1 tablet by mouth daily.    Refill:  3  . ipratropium (ATROVENT) 0.03 % nasal spray    Sig: Place 2 sprays into both nostrils 2 (two) times daily. Do not use for more than 5days.    Dispense:  30 mL    Refill:  0    Order Specific Question:   Supervising Provider    Answer:   Tresa Garter [[id number]]  . dextromethorphan-guaiFENesin (MUCINEX DM) 30-600 MG 12hr tablet    Sig: Take 1 tablet by mouth 2 (two) times daily as needed for cough.    Dispense:  14 tablet     Refill:  0    Order Specific Question:   Supervising Provider    Answer:   Tresa Garter [[id number]]  . HYDROcodone-homatropine (HYCODAN) 5-1.5 MG/5ML syrup    Sig: Take 5 mLs by mouth at bedtime as needed for cough.    Dispense:  120 mL    Refill:  0    Order Specific Question:   Supervising Provider    Answer:   Tresa Garter [[id number]]  . albuterol (PROVENTIL HFA;VENTOLIN HFA) 108 (90 Base) MCG/ACT inhaler    Sig: Inhale 2 puffs into the lungs every 6 (six) hours as needed for wheezing or shortness of breath.    Dispense:  1 Inhaler    Refill:  0    Order Specific Question:   Supervising Provider    Answer:  PLOTNIKOV, ALEKSEI V [[id number]]    Follow-up: Return if symptoms worsen or fail to improve.  Wilfred Lacy, NP

## 2016-10-30 NOTE — Progress Notes (Signed)
Pre visit review using our clinic review tool, if applicable. No additional management support is needed unless otherwise documented below in the visit note. 

## 2016-12-17 ENCOUNTER — Other Ambulatory Visit: Payer: Self-pay | Admitting: Internal Medicine

## 2016-12-17 DIAGNOSIS — F329 Major depressive disorder, single episode, unspecified: Secondary | ICD-10-CM

## 2016-12-17 DIAGNOSIS — F32A Depression, unspecified: Secondary | ICD-10-CM

## 2016-12-17 DIAGNOSIS — F450 Somatization disorder: Principal | ICD-10-CM

## 2017-01-08 ENCOUNTER — Other Ambulatory Visit (INDEPENDENT_AMBULATORY_CARE_PROVIDER_SITE_OTHER): Payer: BLUE CROSS/BLUE SHIELD

## 2017-01-08 ENCOUNTER — Ambulatory Visit (INDEPENDENT_AMBULATORY_CARE_PROVIDER_SITE_OTHER): Payer: BLUE CROSS/BLUE SHIELD | Admitting: Nurse Practitioner

## 2017-01-08 ENCOUNTER — Encounter: Payer: Self-pay | Admitting: Nurse Practitioner

## 2017-01-08 VITALS — BP 108/70 | HR 95 | Temp 98.2°F | Ht 65.0 in | Wt 192.0 lb

## 2017-01-08 DIAGNOSIS — L568 Other specified acute skin changes due to ultraviolet radiation: Secondary | ICD-10-CM

## 2017-01-08 DIAGNOSIS — X32XXXA Exposure to sunlight, initial encounter: Secondary | ICD-10-CM

## 2017-01-08 LAB — POCT URINALYSIS DIPSTICK
BILIRUBIN UA: NEGATIVE
Glucose, UA: NEGATIVE
KETONES UA: NEGATIVE
Leukocytes, UA: NEGATIVE
Nitrite, UA: NEGATIVE
PH UA: 6 (ref 5.0–8.0)
PROTEIN UA: NEGATIVE
UROBILINOGEN UA: 0.2 U/dL

## 2017-01-08 LAB — BASIC METABOLIC PANEL
BUN: 10 mg/dL (ref 6–23)
CALCIUM: 9.4 mg/dL (ref 8.4–10.5)
CO2: 29 meq/L (ref 19–32)
Chloride: 104 mEq/L (ref 96–112)
Creatinine, Ser: 0.75 mg/dL (ref 0.40–1.20)
GFR: 95.87 mL/min (ref >60.00)
Glucose, Bld: 78 mg/dL (ref 70–99)
POTASSIUM: 4.2 meq/L (ref 3.5–5.1)
SODIUM: 138 meq/L (ref 135–145)

## 2017-01-08 MED ORDER — TRIAMCINOLONE ACETONIDE 0.1 % EX OINT: 1.0000 "application " | TOPICAL_OINTMENT | Freq: Two times a day (BID) | CUTANEOUS | 0 refills | Status: DC

## 2017-01-08 NOTE — Patient Instructions (Addendum)
Sunburn Sunburn is damage to the skin that is caused by overexposure to ultraviolet (UV) rays. Repeated sun exposure causes early skin aging, such as wrinkles and sun spots. It also increases the risk of skin cancer.  CAUSES Sunburn is caused by getting too much UV radiation from the sun. RISK FACTORS The following factors may make you more likely to develop this condition:  Having a family history of sensitivity to the sun.  Having certain diseases, such as lupus.  Taking certain medicines.  Using certain cosmetics.  Having light-colored skin (light complexion). SYMPTOMS Symptoms of this condition include:  Red or pink skin.  Soreness and swelling of the affected areas.  Pain.  Blisters.  Peeling skin. You may also have a headache, fever, or fatigue if the sunburn covers a large part of your body. DIAGNOSIS This condition is diagnosed with a medical history and physical exam. TREATMENT Treatment focuses on managing your symptoms. Treatment may include:  Medicines to reduce swelling.  Steroid medicines to help with inflammation and itching. These may be applied as creams or taken by mouth (orally).  Antibiotic cream or ointment to apply to any blisters that break open. HOME CARE INSTRUCTIONS Medicines   Take or apply over-the-counter and prescription medicines only as told by your health care provider.  If you were prescribed an antibiotic medicine, use it as told by your health care provider. Do not stop using the antibiotic even if your condition improves. General Instructions   Avoid further exposure to the sun. Protect sunburned skin by wearing clothing that covers the injured skin.  Do not put ice on your sunburn. This can cause further damage. Try taking a cool bath or applying a cool, wet cloth (cool compress) to your skin. This may help with pain.  Drink enough fluid to keep your urine clear or pale yellow.  Try applying aloe vera or a moisturizer that has  soy in it to your sunburn. This may help. Do not apply aloe vera or moisturizer with soy if your sunburn has blisters.  Do not break any blisters that you may have. PREVENTION  Try to avoid the sun between 10:00 a.m. and 2:00 p.m. when it is the strongest.  Apply sunscreen at least 15 minutes before exposure to the sun.  Apply a sunscreen with an SPF of 15 or higher. Consider using an SPF of 30 or higher if you will be exposed to the sun for prolonged periods of time. Use a sunscreen that protects against all of the sun's rays (broad-spectrum) and is water-resistant.  Reapply sunscreen:  About every two hours during sun exposure.  More often when sweating a lot while out in the sun.  After getting wet from swimming or playing in water.  Wear long sleeves, a hat, and sunglasses that block UV light when you are outside.  Talk with your health care provider about medicines, herbs, and foods that can make you more sensitive to light. Avoid these, if possible.  Do not use tanning beds. SEEK MEDICAL CARE IF:  You have a fever.  Your symptoms do not improve with treatment.  Your pain is not controlled with medicine.  Your burn becomes more painful and swollen. SEEK IMMEDIATE MEDICAL CARE IF:  You start to vomit or have diarrhea.  You feel faint or you pass out.  You have a headache and you feel confused.  You develop severe blistering.  You have pus or fluid coming from the blisters. This information is not intended  to replace advice given to you by your health care provider. Make sure you discuss any questions you have with your health care provider. Document Released: 06/24/2005 Document Revised: 01/06/2016 Document Reviewed: 03/18/2015 Elsevier Interactive Patient Education  2017 [employer]   Rash A rash is a change in the color of the skin. A rash can also change the way your skin feels. There are many different conditions and factors that can cause a rash. Follow  these instructions at home: Pay attention to any changes in your symptoms. Follow these instructions to help with your condition: Medicine  Take or apply over-the-counter and prescription medicines only as told by your health care provider. These may include:  Corticosteroid cream.  Anti-itch lotions.  Oral antihistamines. Skin Care   Apply cool compresses to the affected areas.  Try taking a bath with:  Epsom salts. Follow the instructions on the packaging. You can get these at your local pharmacy or grocery store.  Baking soda. Pour a small amount into the bath as told by your health care provider.  Colloidal oatmeal. Follow the instructions on the packaging. You can get this at your local pharmacy or grocery store.  Try applying baking soda paste to your skin. Stir water into baking soda until it reaches a paste-like consistency.  Do not scratch or rub your skin.  Avoid covering the rash. Make sure the rash is exposed to air as much as possible. General instructions   Avoid hot showers or baths, which can make itching worse. A cold shower may help.  Avoid scented soaps, detergents, and perfumes. Use gentle soaps, detergents, perfumes, and other cosmetic products.  Avoid any substance that causes your rash. Keep a journal to help track what causes your rash. Write down:  What you eat.  What cosmetic products you use.  What you drink.  What you wear. This includes jewelry.  Keep all follow-up visits as told by your health care provider. This is important. Contact a health care provider if:  You sweat at night.  You lose weight.  You urinate more than normal.  You feel weak.  You vomit.  Your skin or the whites of your eyes look yellow (jaundice).  Your skin:  Tingles.  Is numb.  Your rash:  Does not go away after several days.  Gets worse.  You are:  Unusually thirsty.  More tired than normal.  You have:  New symptoms.  Pain in your  abdomen.  A fever.  Diarrhea. Get help right away if:  You develop a rash that covers all or most of your body. The rash may or may not be painful.  You develop blisters that:  Are on top of the rash.  Grow larger or grow together.  Are painful.  Are inside your nose or mouth.  You develop a rash that:  Looks like purple pinprick-sized spots all over your body.  Has a "bull's eye" or looks like a target.  Is not related to sun exposure, is red and painful, and causes your skin to peel. This information is not intended to replace advice given to you by your health care provider. Make sure you discuss any questions you have with your health care provider. Document Released: 09/04/2002 Document Revised: 02/18/2016 Document Reviewed: 01/30/2015 Elsevier Interactive Patient Education  2017 [employer].

## 2017-01-08 NOTE — Progress Notes (Signed)
Subjective:  Patient ID: Laura Guerra, female    DOB: [birth year]  Age: 31 y.o. MRN: [id number]  CC: Rash (rash on arms,legs and foot. going on for 1 wk. itching)   Rash  This is a new problem. The current episode started in the past 7 days. The problem is unchanged. The affected locations include the chest, left arm, right arm and neck. The rash is characterized by itchiness and redness. Associated with: prolonged sun exposure while in [place]  Pertinent negatives include no anorexia, congestion, cough, diarrhea, eye pain, facial edema, fatigue, fever, joint pain, rhinorrhea, shortness of breath or sore throat. Past treatments include nothing. Her past medical history is significant for allergies.    Outpatient Medications Prior to Visit  Medication Sig Dispense Refill  . cyclobenzaprine (FLEXERIL) 5 MG tablet Take 1 tablet (5 mg total) by mouth 3 (three) times daily as needed for muscle spasms. 40 tablet 1  . dextromethorphan-guaiFENesin (MUCINEX DM) 30-600 MG 12hr tablet Take 1 tablet by mouth 2 (two) times daily as needed for cough. 14 tablet 0  . ipratropium (ATROVENT) 0.03 % nasal spray Place 2 sprays into both nostrils 2 (two) times daily. Do not use for more than 5days. 30 mL 0  . levonorgestrel (MIRENA) 20 MCG/24HR IUD 1 each by Intrauterine route once.    . vortioxetine HBr (TRINTELLIX) 10 MG TABS Take 1 tablet (10 mg total) by mouth daily. Yearly physical w/labs due in June must see MD for refills 90 tablet 0  . TRINTELLIX 10 MG TABS Take 1 tablet by mouth daily.  3  . albuterol (PROVENTIL HFA;VENTOLIN HFA) 108 (90 Base) MCG/ACT inhaler Inhale 2 puffs into the lungs every 6 (six) hours as needed for wheezing or shortness of breath. (Patient not taking: Reported on 01/08/2017) 1 Inhaler 0  . buPROPion (WELLBUTRIN XL) 150 MG 24 hr tablet Take 1 tablet (150 mg total) by mouth daily. (Patient not taking: Reported on 01/08/2017) 90 tablet 3  . HYDROcodone-homatropine (HYCODAN) 5-1.5  MG/5ML syrup Take 5 mLs by mouth at bedtime as needed for cough. (Patient not taking: Reported on 01/08/2017) 120 mL 0  . meloxicam (MOBIC) 15 MG tablet Take 1 tablet (15 mg total) by mouth daily. As needed for pain (Patient not taking: Reported on 01/08/2017) 30 tablet 1  . traMADol (ULTRAM) 50 MG tablet Take 1 tablet (50 mg total) by mouth every 8 (eight) hours as needed. (Patient not taking: Reported on 01/08/2017) 40 tablet 0   No facility-administered medications prior to visit.     ROS See HPI  Objective:  BP 108/70   Pulse 95   Temp 98.2 F (36.8 C)   Ht  (1.651 m)   Wt 192 lb (87.1 kg)   SpO2 98%   BMI 31.95 kg/m   BP Readings from Last 3 Encounters:  01/08/17 108/70  10/30/16 110/78  10/08/16 126/78    Wt Readings from Last 3 Encounters:  01/08/17 192 lb (87.1 kg)  10/30/16 199 lb (90.3 kg)  05/29/16 181 lb (82.1 kg)    Physical Exam  Constitutional: She is oriented to person, place, and time. No distress.  Neck: Normal range of motion. Neck supple.  Cardiovascular: Normal rate.   Pulmonary/Chest: Effort normal.  Neurological: She is alert and oriented to person, place, and time.  Skin: Skin is warm and dry. Rash noted. Rash is maculopapular. There is erythema.     Vitals reviewed.   Lab Results  Component Value Date  WBC 9.1 03/16/2016   HGB 14.9 03/16/2016   HCT 43.2 03/16/2016   PLT 224.0 03/16/2016   GLUCOSE 78 01/08/2017   CHOL 144 03/16/2016   TRIG 224.0 (H) 03/16/2016   HDL 36.10 (L) 03/16/2016   LDLDIRECT 83.0 03/16/2016   LDLCALC 67 03/11/2015   ALT 8 03/16/2016   AST 10 03/16/2016   NA 138 01/08/2017   K 4.2 01/08/2017   CL 104 01/08/2017   CREATININE 0.75 01/08/2017   BUN 10 01/08/2017   CO2 29 01/08/2017   TSH 3.64 03/16/2016    Dg Chest 2 View  Result Date: 05/30/2015 CLINICAL DATA:  Upper left-sided chest pain radiating to the back for the past 2 days, history of tobacco use EXAM: CHEST  2 VIEW COMPARISON:  None. FINDINGS:  The lungs are mildly hyperinflated with hemidiaphragm flattening. There is no focal infiltrate. There is no pleural effusion. The heart and pulmonary vascularity are normal. The mediastinum is normal in width. The bony thorax is unremarkable. IMPRESSION: COPD.  There is no active cardiopulmonary disease. Electronically Signed   By: David  [place] M.D.   On: 05/30/2015 10:55    Assessment & Plan:   Archita was seen today for rash.  Diagnoses and all orders for this visit:  Photodermatitis due to sun -     triamcinolone ointment (KENALOG) 0.1 %; Apply 1 application topically 2 (two) times daily.  Excess sun exposure -     POCT urinalysis dipstick -     Basic metabolic panel; Future   I have discontinued Ms. Urey's buPROPion, meloxicam, traMADol, HYDROcodone-homatropine, and albuterol. I am also having her start on triamcinolone ointment. Additionally, I am having her maintain her levonorgestrel, cyclobenzaprine, TRINTELLIX, ipratropium, dextromethorphan-guaiFENesin, and vortioxetine HBr.  Meds ordered this encounter  Medications  . triamcinolone ointment (KENALOG) 0.1 %    Sig: Apply 1 application topically 2 (two) times daily.    Dispense:  30 g    Refill:  0    Order Specific Question:   Supervising Provider    Answer:   Tresa Garter [[id number]]    Follow-up: Return if symptoms worsen or fail to improve.  Alysia Penna, NP

## 2017-01-08 NOTE — Progress Notes (Signed)
Pre visit review using our clinic review tool, if applicable. No additional management support is needed unless otherwise documented below in the visit note. 

## 2017-03-02 ENCOUNTER — Other Ambulatory Visit: Payer: Self-pay | Admitting: Internal Medicine

## 2017-03-02 ENCOUNTER — Encounter: Payer: Self-pay | Admitting: Nurse Practitioner

## 2017-03-02 ENCOUNTER — Telehealth: Payer: Self-pay | Admitting: Internal Medicine

## 2017-03-02 ENCOUNTER — Ambulatory Visit (INDEPENDENT_AMBULATORY_CARE_PROVIDER_SITE_OTHER): Payer: BLUE CROSS/BLUE SHIELD | Admitting: Nurse Practitioner

## 2017-03-02 VITALS — BP 120/86 | HR 74 | Temp 98.4°F | Ht 65.0 in | Wt 189.0 lb

## 2017-03-02 DIAGNOSIS — J00 Acute nasopharyngitis [common cold]: Secondary | ICD-10-CM

## 2017-03-02 DIAGNOSIS — F329 Major depressive disorder, single episode, unspecified: Secondary | ICD-10-CM

## 2017-03-02 DIAGNOSIS — F450 Somatization disorder: Principal | ICD-10-CM

## 2017-03-02 DIAGNOSIS — F32A Depression, unspecified: Secondary | ICD-10-CM

## 2017-03-02 LAB — POCT RAPID STREP A (OFFICE): RAPID STREP A SCREEN: NEGATIVE

## 2017-03-02 MED ORDER — FLUTICASONE PROPIONATE 50 MCG/ACT NA SUSP: 2.0000 | Freq: Every day | NASAL | 0 refills | Status: DC

## 2017-03-02 MED ORDER — OXYMETAZOLINE HCL 0.05 % NA SOLN
1.0000 | Freq: Two times a day (BID) | NASAL | 0 refills | Status: DC
Start: 2017-03-02 — End: 2017-04-30

## 2017-03-02 MED ORDER — DM-GUAIFENESIN ER 30-600 MG PO TB12: 1.0000 | ORAL_TABLET | Freq: Two times a day (BID) | ORAL | 0 refills | Status: DC | PRN

## 2017-03-02 MED ORDER — TRINTELLIX 10 MG PO TABS: 1.0000 | ORAL_TABLET | Freq: Every day | ORAL | 11 refills | Status: DC

## 2017-03-02 NOTE — Telephone Encounter (Signed)
RX sent

## 2017-03-02 NOTE — Telephone Encounter (Signed)
Patient would like to know if she could get a refill on trintellix?

## 2017-03-02 NOTE — Progress Notes (Signed)
Subjective:  Patient ID: Laura Guerra, female    DOB: 1986/02/19  Age: 31 y.o. MRN: [id number]  CC: Sore Throat (sore throat,ears pain,bodyache and weak. started yesterday. )   URI   This is a new problem. The current episode started yesterday. The problem has been unchanged. There has been no fever. Associated symptoms include congestion, coughing, headaches, rhinorrhea, sinus pain, sneezing, a sore throat and swollen glands. Pertinent negatives include no abdominal pain, chest pain, diarrhea, dysuria, ear pain, joint pain, joint swelling, nausea, neck pain, plugged ear sensation, rash, vomiting or wheezing. She has tried nothing for the symptoms.    Outpatient Medications Prior to Visit  Medication Sig Dispense Refill  . levonorgestrel (MIRENA) 20 MCG/24HR IUD 1 each by Intrauterine route once.    . TRINTELLIX 10 MG TABS Take 1 tablet (10 mg total) by mouth daily. 30 tablet 11   No facility-administered medications prior to visit.     ROS See HPI  Objective:  BP 120/86   Pulse 74   Temp 98.4 F (36.9 C)   Ht 5\' 5"  (1.651 m)   Wt 189 lb (85.7 kg)   SpO2 96%   BMI 31.45 kg/m   BP Readings from Last 3 Encounters:  03/02/17 120/86  01/08/17 108/70  10/30/16 110/78    Wt Readings from Last 3 Encounters:  03/02/17 189 lb (85.7 kg)  01/08/17 192 lb (87.1 kg)  10/30/16 199 lb (90.3 kg)    Physical Exam  Constitutional: She is oriented to person, place, and time.  HENT:  Right Ear: Tympanic membrane, external ear and ear canal normal.  Left Ear: Tympanic membrane, external ear and ear canal normal.  Nose: Mucosal edema and rhinorrhea present. Right sinus exhibits maxillary sinus tenderness and frontal sinus tenderness. Left sinus exhibits maxillary sinus tenderness and frontal sinus tenderness.  Mouth/Throat: Uvula is midline. No trismus in the jaw. Posterior oropharyngeal erythema present. No oropharyngeal exudate.  Eyes: No scleral icterus.  Neck: Normal range  of motion. Neck supple.  Cardiovascular: Normal rate and normal heart sounds.   Pulmonary/Chest: Effort normal and breath sounds normal.  Musculoskeletal: She exhibits no edema.  Lymphadenopathy:    She has cervical adenopathy.  Neurological: She is alert and oriented to person, place, and time.  Vitals reviewed.   Lab Results  Component Value Date   WBC 9.1 03/16/2016   HGB 14.9 03/16/2016   HCT 43.2 03/16/2016   PLT 224.0 03/16/2016   GLUCOSE 78 01/08/2017   CHOL 144 03/16/2016   TRIG 224.0 (H) 03/16/2016   HDL 36.10 (L) 03/16/2016   LDLDIRECT 83.0 03/16/2016   LDLCALC 67 03/11/2015   ALT 8 03/16/2016   AST 10 03/16/2016   NA 138 01/08/2017   K 4.2 01/08/2017   CL 104 01/08/2017   CREATININE 0.75 01/08/2017   BUN 10 01/08/2017   CO2 29 01/08/2017   TSH 3.64 03/16/2016    Dg Chest 2 View  Result Date: 05/30/2015 CLINICAL DATA:  Upper left-sided chest pain radiating to the back for the past 2 days, history of tobacco use EXAM: CHEST  2 VIEW COMPARISON:  None. FINDINGS: The lungs are mildly hyperinflated with hemidiaphragm flattening. There is no focal infiltrate. There is no pleural effusion. The heart and pulmonary vascularity are normal. The mediastinum is normal in width. The bony thorax is unremarkable. IMPRESSION: COPD.  There is no active cardiopulmonary disease. Electronically Signed   By: David  [place]iland[place] M.D.   On: 05/30/2015 10:55  Assessment & Plan:   Wynn was seen today for sore throat.  Diagnoses and all orders for this visit:  Acute nasopharyngitis -     POCT rapid strep A -     fluticasone (FLONASE) 50 MCG/ACT nasal spray; Place 2 sprays into both nostrils daily. -     oxymetazoline (AFRIN NASAL SPRAY) 0.05 % nasal spray; Place 1 spray into both nostrils 2 (two) times daily. Use only for 3days, then stop -     dextromethorphan-guaiFENesin (MUCINEX DM) 30-600 MG 12hr tablet; Take 1 tablet by mouth 2 (two) times daily as needed for cough.   I am having  Ms. Ruddock start on fluticasone, oxymetazoline, and dextromethorphan-guaiFENesin. I am also having her maintain her levonorgestrel and TRINTELLIX.  Meds ordered this encounter  Medications  . fluticasone (FLONASE) 50 MCG/ACT nasal spray    Sig: Place 2 sprays into both nostrils daily.    Dispense:  16 g    Refill:  0    Order Specific Question:   Supervising Provider    Answer:   Tresa Garter [[id number]]  . oxymetazoline (AFRIN NASAL SPRAY) 0.05 % nasal spray    Sig: Place 1 spray into both nostrils 2 (two) times daily. Use only for 3days, then stop    Dispense:  30 mL    Refill:  0    Order Specific Question:   Supervising Provider    Answer:   Tresa Garter [[id number]]  . dextromethorphan-guaiFENesin (MUCINEX DM) 30-600 MG 12hr tablet    Sig: Take 1 tablet by mouth 2 (two) times daily as needed for cough.    Dispense:  14 tablet    Refill:  0    Order Specific Question:   Supervising Provider    Answer:   Tresa Garter [[id number]]    Follow-up: Return if symptoms worsen or fail to improve.  Alysia Penna, NP

## 2017-03-02 NOTE — Patient Instructions (Signed)
URI Instructions: Flonase and Afrin use: apply 1spray of afrin in each nare, wait 5mins, then apply 2sprays of flonase in each nare. Use both nasal spray consecutively x 3days, then flonase only for at least 14days.  Encourage adequate oral hydration.  Use over-the-counter  "cold" medicines  such as "Tylenol cold" , "Advil cold",  "Mucinex" or" Mucinex D"  for cough and congestion.  Avoid decongestants if you have high blood pressure. Use" Delsym" or" Robitussin" cough syrup varietis for cough.  You can use plain "Tylenol" or "Advi"l for fever, chills and achyness.   "Common cold" symptoms are usually triggered by a virus.  The antibiotics are usually not necessary. On average, a" viral cold" illness would take 4-7 days to resolve. Please, make an appointment if you are not better or if you're worse.  

## 2017-03-02 NOTE — Telephone Encounter (Signed)
Will notify patient at check in today

## 2017-04-23 ENCOUNTER — Encounter: Payer: Self-pay | Admitting: Obstetrics and Gynecology

## 2017-04-23 ENCOUNTER — Ambulatory Visit: Payer: BLUE CROSS/BLUE SHIELD | Admitting: Obstetrics and Gynecology

## 2017-04-23 NOTE — Progress Notes (Deleted)
31 y.o. [id number] Single Caucasian female here for annual exam.    PCP:     No LMP recorded. Patient is not currently having periods (Reason: IUD).           Sexually active: {yes no:314532}  The current method of family planning is {contraception:315051}.    Exercising: {yes no:314532}  {types:19826} Smoker:  yes  Health Maintenance: Pap: 04/17/16 Pap ASCUS but no dysplasia, HR HPV negative   02/04/12 WNL History of abnormal Pap:  No TDaP:  2016 HIV: 2007 Screening Labs:     reports that she has been smoking Cigarettes.  She has a 20.00 pack-year smoking history. She has never used smokeless tobacco. She reports that she drinks alcohol. She reports that she uses drugs, including Marijuana, about 1 time per week.  Past Medical History:  Diagnosis Date  . No pertinent past medical history   . Seasonal depression (HCC)     Past Surgical History:  Procedure Laterality Date  . NO PAST SURGERIES    . WISDOM TOOTH EXTRACTION      Current Outpatient Prescriptions  Medication Sig Dispense Refill  . dextromethorphan-guaiFENesin (MUCINEX DM) 30-600 MG 12hr tablet Take 1 tablet by mouth 2 (two) times daily as needed for cough. 14 tablet 0  . fluticasone (FLONASE) 50 MCG/ACT nasal spray Place 2 sprays into both nostrils daily. 16 g 0  . levonorgestrel (MIRENA) 20 MCG/24HR IUD 1 each by Intrauterine route once.    Marland Kitchen. oxymetazoline (AFRIN NASAL SPRAY) 0.05 % nasal spray Place 1 spray into both nostrils 2 (two) times daily. Use only for 3days, then stop 30 mL 0  . TRINTELLIX 10 MG TABS Take 1 tablet (10 mg total) by mouth daily. 30 tablet 11   No current facility-administered medications for this visit.     Family History  Problem Relation Age of Onset  . Cancer Father   . Cancer Maternal Grandmother   . Breast cancer Maternal Grandmother   . Cancer Paternal Uncle     ROS:  Pertinent items are noted in HPI.  Otherwise, a comprehensive ROS was negative.  Exam:   There were no vitals  taken for this visit.    General appearance: alert, cooperative and appears stated age Head: Normocephalic, without obvious abnormality, atraumatic Neck: no adenopathy, supple, symmetrical, trachea midline and thyroid normal to inspection and palpation Lungs: clear to auscultation bilaterally Breasts: normal appearance, no masses or tenderness, No nipple retraction or dimpling, No nipple discharge or bleeding, No axillary or supraclavicular adenopathy Heart: regular rate and rhythm Abdomen: soft, non-tender; no masses, no organomegaly Extremities: extremities normal, atraumatic, no cyanosis or edema Skin: Skin color, texture, turgor normal. No rashes or lesions Lymph nodes: Cervical, supraclavicular, and axillary nodes normal. No abnormal inguinal nodes palpated Neurologic: Grossly normal  Pelvic: External genitalia:  no lesions              Urethra:  normal appearing urethra with no masses, tenderness or lesions              Bartholins and Skenes: normal                 Vagina: normal appearing vagina with normal color and discharge, no lesions              Cervix: no lesions              Pap taken: {yes no:314532} Bimanual Exam:  Uterus:  normal size, contour, position, consistency, mobility, non-tender  Adnexa: no mass, fullness, tenderness              Rectal exam: {yes no:314532}.  Confirms.              Anus:  normal sphincter tone, no lesions  Chaperone was present for exam.  Assessment:   Well woman visit with normal exam.   Plan: Mammogram screening discussed. Recommended self breast awareness. Pap and HR HPV as above. Guidelines for Calcium, Vitamin D, regular exercise program including cardiovascular and weight bearing exercise.   Follow up annually and prn.   Additional counseling given.  {yes [id number]. _______ minutes face to face time of which over 50% was spent in counseling.    After visit summary provided.

## 2017-04-30 ENCOUNTER — Encounter: Payer: Self-pay | Admitting: Nurse Practitioner

## 2017-04-30 ENCOUNTER — Ambulatory Visit (INDEPENDENT_AMBULATORY_CARE_PROVIDER_SITE_OTHER)
Admission: RE | Admit: 2017-04-30 | Discharge: 2017-04-30 | Disposition: A | Discharge status: Home / Self Care | Payer: BLUE CROSS/BLUE SHIELD | Source: Ambulatory Visit | Attending: Nurse Practitioner | Admitting: Nurse Practitioner

## 2017-04-30 ENCOUNTER — Other Ambulatory Visit (INDEPENDENT_AMBULATORY_CARE_PROVIDER_SITE_OTHER): Payer: BLUE CROSS/BLUE SHIELD

## 2017-04-30 ENCOUNTER — Ambulatory Visit (INDEPENDENT_AMBULATORY_CARE_PROVIDER_SITE_OTHER): Payer: BLUE CROSS/BLUE SHIELD | Admitting: Nurse Practitioner

## 2017-04-30 VITALS — BP 110/76 | HR 90 | Temp 97.8°F | Ht 65.0 in | Wt 185.0 lb

## 2017-04-30 DIAGNOSIS — K5900 Constipation, unspecified: Secondary | ICD-10-CM

## 2017-04-30 DIAGNOSIS — R1011 Right upper quadrant pain: Secondary | ICD-10-CM

## 2017-04-30 DIAGNOSIS — R319 Hematuria, unspecified: Secondary | ICD-10-CM | POA: Diagnosis not present

## 2017-04-30 LAB — HEPATIC FUNCTION PANEL
ALK PHOS: 50 U/L (ref 39–117)
ALT: 10 U/L (ref 0–35)
AST: 12 U/L (ref 0–37)
Albumin: 4.7 g/dL (ref 3.5–5.2)
BILIRUBIN DIRECT: 0.1 mg/dL (ref 0.0–0.3)
BILIRUBIN TOTAL: 0.8 mg/dL (ref 0.2–1.2)
TOTAL PROTEIN: 6.9 g/dL (ref 6.0–8.3)

## 2017-04-30 LAB — POCT URINALYSIS DIPSTICK
Bilirubin, UA: NEGATIVE
Glucose, UA: NEGATIVE
KETONES UA: NEGATIVE
Leukocytes, UA: NEGATIVE
Nitrite, UA: NEGATIVE
PH UA: 6 (ref 5.0–8.0)
PROTEIN UA: NEGATIVE
SPEC GRAV UA: 1.01 (ref 1.010–1.025)
UROBILINOGEN UA: 0.2 U/dL

## 2017-04-30 LAB — AMYLASE: AMYLASE: 37 U/L (ref 27–131)

## 2017-04-30 LAB — LIPASE: LIPASE: 22 U/L (ref 11.0–59.0)

## 2017-04-30 MED ORDER — LACTULOSE 10 GM/15ML PO SOLN: 20.0000 g | Freq: Every day | ORAL | 0 refills | Status: DC | PRN

## 2017-04-30 NOTE — Progress Notes (Signed)
Subjective:  Patient ID: Laura Guerra, female    DOB: [birth year]  Age: 31 y.o. MRN: [id number]  CC: Pain (pain right abd-shoulder pain--48 hrs, on and off--changed diet 1 mo--constipation going on for a little)   Abdominal Pain  This is a new problem. The current episode started yesterday. The onset quality is sudden. The problem occurs constantly. The problem has been unchanged. The pain is located in the RUQ. The quality of the pain is colicky and a sensation of fullness. The abdominal pain radiates to the right shoulder. Associated symptoms include constipation. Pertinent negatives include no anorexia, dysuria, fever, frequency, hematuria, melena, myalgias, nausea or vomiting. The pain is aggravated by palpation. The pain is relieved by being still. She has tried nothing for the symptoms.    Outpatient Medications Prior to Visit  Medication Sig Dispense Refill  . levonorgestrel (MIRENA) 20 MCG/24HR IUD 1 each by Intrauterine route once.    Marland Kitchen. dextromethorphan-guaiFENesin (MUCINEX DM) 30-600 MG 12hr tablet Take 1 tablet by mouth 2 (two) times daily as needed for cough. (Patient not taking: Reported on 04/30/2017) 14 tablet 0  . fluticasone (FLONASE) 50 MCG/ACT nasal spray Place 2 sprays into both nostrils daily. (Patient not taking: Reported on 04/30/2017) 16 g 0  . oxymetazoline (AFRIN NASAL SPRAY) 0.05 % nasal spray Place 1 spray into both nostrils 2 (two) times daily. Use only for 3days, then stop (Patient not taking: Reported on 04/30/2017) 30 mL 0  . TRINTELLIX 10 MG TABS Take 1 tablet (10 mg total) by mouth daily. (Patient not taking: Reported on 04/30/2017) 30 tablet 11   No facility-administered medications prior to visit.     ROS See HPI  Objective:  BP 110/76   Pulse 90   Temp 97.8 F (36.6 C)   Ht 5\' 5"  (1.651 m)   Wt 185 lb (83.9 kg)   SpO2 99%   BMI 30.79 kg/m   BP Readings from Last 3 Encounters:  04/30/17 110/76  03/02/17 120/86  01/08/17 108/70    Wt  Readings from Last 3 Encounters:  04/30/17 185 lb (83.9 kg)  03/02/17 189 lb (85.7 kg)  01/08/17 192 lb (87.1 kg)    Physical Exam  Constitutional: She is oriented to person, place, and time. No distress.  Neck: Normal range of motion. Neck supple.  Cardiovascular: Normal rate and regular rhythm.   Pulmonary/Chest: Effort normal and breath sounds normal.  Abdominal: Soft. Bowel sounds are normal. She exhibits no distension. There is no tenderness. There is no rebound and no guarding.  Negative CVA tenderness.  Musculoskeletal: Normal range of motion. She exhibits no edema or tenderness.  Neurological: She is alert and oriented to person, place, and time.  Skin: Skin is warm and dry. No rash noted. No erythema.  Vitals reviewed.   Lab Results  Component Value Date   WBC 9.1 03/16/2016   HGB 14.9 03/16/2016   HCT 43.2 03/16/2016   PLT 224.0 03/16/2016   GLUCOSE 78 01/08/2017   CHOL 144 03/16/2016   TRIG 224.0 (H) 03/16/2016   HDL 36.10 (L) 03/16/2016   LDLDIRECT 83.0 03/16/2016   LDLCALC 67 03/11/2015   ALT 10 04/30/2017   AST 12 04/30/2017   NA 138 01/08/2017   K 4.2 01/08/2017   CL 104 01/08/2017   CREATININE 0.75 01/08/2017   BUN 10 01/08/2017   CO2 29 01/08/2017   TSH 3.64 03/16/2016    Dg Chest 2 View  Result Date: 05/30/2015 CLINICAL DATA:  Upper  left-sided chest pain radiating to the back for the past 2 days, history of tobacco use EXAM: CHEST  2 VIEW COMPARISON:  None. FINDINGS: The lungs are mildly hyperinflated with hemidiaphragm flattening. There is no focal infiltrate. There is no pleural effusion. The heart and pulmonary vascularity are normal. The mediastinum is normal in width. The bony thorax is unremarkable. IMPRESSION: COPD.  There is no active cardiopulmonary disease. Electronically Signed   By: David  Sw[place]ilandJordan M.D.   On: 05/30/2015 10:55    Assessment & Plan:   Laura HerterShannon was seen today for pain.  Diagnoses and all orders for this visit:  RUQ abdominal  pain -     POCT urinalysis dipstick -     DG Abd 1 View; Future -     Amylase; Future -     Lipase; Future -     Hepatic function panel; Future -     lactulose (CHRONULAC) 10 GM/15ML solution; Take 30 mLs (20 g total) by mouth daily as needed for moderate constipation.  Hematuria, unspecified type  Constipation, unspecified constipation type -     lactulose (CHRONULAC) 10 GM/15ML solution; Take 30 mLs (20 g total) by mouth daily as needed for moderate constipation.   I have discontinued Laura Guerra's TRINTELLIX, fluticasone, oxymetazoline, and dextromethorphan-guaiFENesin. I am also having her start on lactulose. Additionally, I am having her maintain her levonorgestrel.  Meds ordered this encounter  Medications  . lactulose (CHRONULAC) 10 GM/15ML solution    Sig: Take 30 mLs (20 g total) by mouth daily as needed for moderate constipation.    Dispense:  240 mL    Refill:  0    Order Specific Question:   Supervising Provider    Answer:   Tresa GarterPLOTNIKOV, ALEKSEI V [[id number]]    Follow-up: Return if symptoms worsen or fail to improve.  Alysia Pennaharlotte Chee Kinslow, NP

## 2017-04-30 NOTE — Patient Instructions (Addendum)
ABD x-ray indicates constipation. Urinalysis indicates microscopic hematuria (chronic per previous urinalysis). Normal hepatic function and pancreatic enzymes.  Treated for constipation.

## 2017-06-15 ENCOUNTER — Telehealth: Payer: Self-pay | Admitting: Obstetrics and Gynecology

## 2017-06-15 NOTE — Telephone Encounter (Signed)
Called and left patient a message to call back about a missed appointment °

## 2017-06-25 ENCOUNTER — Encounter: Payer: Self-pay | Admitting: Internal Medicine

## 2017-06-25 ENCOUNTER — Ambulatory Visit (INDEPENDENT_AMBULATORY_CARE_PROVIDER_SITE_OTHER): Payer: BLUE CROSS/BLUE SHIELD | Admitting: Internal Medicine

## 2017-06-25 VITALS — BP 122/84 | HR 85 | Temp 97.8°F | Ht 65.0 in | Wt 181.0 lb

## 2017-06-25 DIAGNOSIS — M5412 Radiculopathy, cervical region: Secondary | ICD-10-CM

## 2017-06-25 MED ORDER — TRAMADOL HCL 50 MG PO TABS: 50.0000 mg | ORAL_TABLET | Freq: Three times a day (TID) | ORAL | 1 refills | Status: DC | PRN

## 2017-06-25 MED ORDER — PREDNISONE 10 MG PO TABS: ORAL_TABLET | ORAL | 0 refills | Status: DC

## 2017-06-25 NOTE — Patient Instructions (Signed)
Please take all new medication as prescribed - the pain medication, and prednisone  You are given the work note  Please return Mon Oct 1 for reevaluation and consideration for possible MRI  Please continue all other medications as before, and refills have been done if requested.  Please have the pharmacy call with any other refills you may need.  Please keep your appointments with your specialists as you may have planned

## 2017-06-25 NOTE — Progress Notes (Signed)
   Subjective:    Patient ID: Laura Guerra, female    DOB: 20-Nov-1985, 31 y.o.   MRN: [id number]  HPI  Here with c/o 2 wks onset midline low cervical pain and burning often severe pain to the upper back, shoulder and left upper arm. Has been at least mild constant, but worse with motion such as full head and neck flexion.  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  Pt denies bowel or bladder change, fever, wt loss,  Worsening UE or LE weakness, gait change or falls.   Past Medical History:  Diagnosis Date  . No pertinent past medical history   . Seasonal depression (HCC)    Past Surgical History:  Procedure Laterality Date  . NO PAST SURGERIES    . WISDOM TOOTH EXTRACTION      reports that she has been smoking Cigarettes.  She has a 20.00 pack-year smoking history. She has never used smokeless tobacco. She reports that she drinks alcohol. She reports that she uses drugs, including Marijuana, about 1 time per week. family history includes Breast cancer in her maternal grandmother; Cancer in her father, maternal grandmother, and paternal uncle. Allergies  Allergen Reactions  . Pennsaid [Diclofenac Sodium]     Local rash   Current Outpatient Prescriptions on File Prior to Visit  Medication Sig Dispense Refill  . lactulose (CHRONULAC) 10 GM/15ML solution Take 30 mLs (20 g total) by mouth daily as needed for moderate constipation. 240 mL 0  . levonorgestrel (MIRENA) 20 MCG/24HR IUD 1 each by Intrauterine route once.     No current facility-administered medications on file prior to visit.    Review of Systems  All otherwise neg per pt    Objective:   Physical Exam BP 122/84   Pulse 85   Temp 97.8 F (36.6 C) (Oral)   Ht  (1.651 m)   Wt 181 lb (82.1 kg)   SpO2 100%   BMI 30.12 kg/m  VS noted,  Constitutional: Pt appears in NAD HENT: Head: NCAT.  Right Ear: External ear  normal.  Left Ear: External ear normal.  Eyes: . Pupils are equal, round, and reactive to light. Conjunctivae and EOM are normal Nose: without d/c or deformity Neck: Neck supple. Gross normal ROM but has midline low cervical mild tenderness with nondiscrete vague left post cervical swelling, but no trapezoid tender bilat, shoulder NT with FROM Cardiovascular: Normal rate and regular rhythm.   Pulmonary/Chest: Effort normal and breath sounds without rales or wheezing.  Neurological: Pt is alert. At baseline orientation, motor 5/5 intact to all extremities, sens intact, grip strength normal Skin: Skin is warm. No rashes, other new lesions, no LE edema Psychiatric: Pt behavior is normal without agitation ., mild nervous No other exam findings    Assessment & Plan:

## 2017-06-25 NOTE — Assessment & Plan Note (Signed)
With mod to severe pain at times but fortunately no neuro changes; suspect significant cervical disc dz or DJD with nerve root irritation, for tramadol prn, predpac asd, consider gabapentin trial if no improved, consider MRI for persistent or worsening symptoms, gave off work note for tonight, and pt asks to f/u with me on Oct 1 to reassess; would like to avoid MRI if possible due to cost

## 2017-07-03 NOTE — Progress Notes (Signed)
[name] [address] [place], [place] [address] Phone: [phone] Subjective:    I'm seeing this patient by the request  of:    CC:   [id number]  Laura Guerra is a 31 y.o. female coming in with complaint of left shoulder pain. She has been having pain for 5 weeks. She has been using anti inflammatories and pain killers. Her pain does from the cervical spine to the elbow. She has a what she describes as a constant ache.  Patient states in the muscle relaxer she has hasn't seemed to be helping either. Did respond previously to osteopathic manipulation. Has had this pain off and on for quite some time.     Past Medical History:  Diagnosis Date  . No pertinent past medical history   . Seasonal depression (HCC)    Past Surgical History:  Procedure Laterality Date  . NO PAST SURGERIES    . WISDOM TOOTH EXTRACTION     Social History   Social History  . Marital status: Single    Spouse name: N/A  . Number of children: 2  . Years of education: 12   Occupational History  . Academic librarian    Social History Main Topics  . Smoking status: Current Every Day Smoker    Packs/day: 1.00    Years: 20.00    Types: Cigarettes  . Smokeless tobacco: Never Used  . Alcohol use 0.0 - 0.6 oz/week     Comment: occasionally  . Drug use: Yes    Frequency: 1.0 time per week    Types: Marijuana     Comment: Rarely  . Sexual activity: Yes    Partners: Male    Birth control/ protection: IUD   Other Topics Concern  . Not on file   Social History Narrative  . No narrative on file   Allergies  Allergen Reactions  . Pennsaid [Diclofenac Sodium]     Local rash   Family History  Problem Relation Age of Onset  . Cancer Father   . Cancer Maternal Grandmother   . Breast cancer Maternal Grandmother   . Cancer Paternal Uncle      Past medical history, social, surgical and family history all reviewed in electronic medical record.  No  pertanent information unless stated regarding to the chief complaint.   Review of Systems:Review of systems updated and as accurate as of 07/03/17  No headache, visual changes, nausea, vomiting, diarrhea, constipation, dizziness, abdominal pain, skin rash, fevers, chills, night sweats, weight loss, swollen lymph nodes, body aches, joint swelling, muscle aches, chest pain, shortness of breath, mood changes.   Objective  There were no vitals taken for this visit. Systems examined below as of 07/03/17   General: No apparent distress alert and oriented x3 mood and affect normal, dressed appropriately.  HEENT: Pupils equal, extraocular movements intact  Respiratory: Patient's speak in full sentences and does not appear short of breath  Cardiovascular: No lower extremity edema, non tender, no erythema  Skin: Warm dry intact with no signs of infection or rash on extremities or on axial skeleton.  Abdomen: Soft nontender  Neuro: Cranial nerves II through XII are intact, neurovascularly intact in all extremities with 2+ DTRs and 2+ pulses.  Lymph: No lymphadenopathy of posterior or anterior cervical chain or axillae bilaterally.  Gait normal with good balance and coordination.  MSK:  Non tender with full range of motion and good stability and symmetric strength and tone of shoulders, elbows,  wrist, hip, knee and ankles bilaterally.  Neck: Inspection loss of lordosis. No palpable stepoffs. Negative Spurling's maneuver. Mild hypermobility Grip strength and sensation normal in bilateral hands Strength good C4 to T1 distribution No sensory change to C4 to T1 Negative Hoffman sign bilaterally Reflexes normal Significant tightness of the left trapezius.  Osteopathic findings C4 flexed rotated and side bent left C6 flexed rotated and side bent left T3 extended rotated and side bent left inhaled third rib T11 extended rotated and side bent left L2 flexed rotated and side bent right Sacrum right  on right       Impression and Recommendations:     This case required medical decision making of moderate complexity.      Note: This dictation was prepared with Dragon dictation along with smaller phrase technology. Any transcriptional errors that result from this process are unintentional.

## 2017-07-05 ENCOUNTER — Ambulatory Visit (INDEPENDENT_AMBULATORY_CARE_PROVIDER_SITE_OTHER)
Admission: RE | Admit: 2017-07-05 | Discharge: 2017-07-05 | Disposition: A | Discharge status: Home / Self Care | Payer: BLUE CROSS/BLUE SHIELD | Source: Ambulatory Visit | Attending: Family Medicine | Admitting: Family Medicine

## 2017-07-05 ENCOUNTER — Ambulatory Visit: Payer: Self-pay

## 2017-07-05 ENCOUNTER — Encounter: Payer: Self-pay | Admitting: Family Medicine

## 2017-07-05 ENCOUNTER — Ambulatory Visit (INDEPENDENT_AMBULATORY_CARE_PROVIDER_SITE_OTHER): Payer: BLUE CROSS/BLUE SHIELD | Admitting: Family Medicine

## 2017-07-05 VITALS — BP 92/68 | HR 82 | Ht 65.0 in

## 2017-07-05 DIAGNOSIS — M940 Chondrocostal junction syndrome [Tietze]: Secondary | ICD-10-CM | POA: Diagnosis not present

## 2017-07-05 DIAGNOSIS — M25512 Pain in left shoulder: Secondary | ICD-10-CM

## 2017-07-05 DIAGNOSIS — M999 Biomechanical lesion, unspecified: Secondary | ICD-10-CM

## 2017-07-05 DIAGNOSIS — M5412 Radiculopathy, cervical region: Secondary | ICD-10-CM

## 2017-07-05 MED ORDER — GABAPENTIN 100 MG PO CAPS: 200.0000 mg | ORAL_CAPSULE | Freq: Every day | ORAL | 3 refills | Status: DC

## 2017-07-05 NOTE — Assessment & Plan Note (Signed)
Discussed with patient at great length. Patient did have significant difficulty with the scapular dyskinesis. Patient will have neck x-rays to make sure that there is known degenerative disc disease significant be contribute in. We have had a radiculitis previously and will start on gabapentin. Responded well to a supine manipulation.

## 2017-07-05 NOTE — Patient Instructions (Addendum)
Good to see you  Gustavus Bryant is your friend.  On wall with heels, butt shoulder and head touching for a goal of 5 minutes daily  Exercises 3 times a week.  Tried manipulation as well  Start gabapentin  at night See me again in 3-4 weeks.

## 2017-07-05 NOTE — Assessment & Plan Note (Signed)
Gabapentin

## 2017-07-05 NOTE — Assessment & Plan Note (Signed)
Decision today to treat with OMT was based on Physical Exam  After verbal consent patient was treated with HVLA, ME, FPR techniques in cervical, thoracic, rib, lumbar and sacral areas  Patient tolerated the procedure well with improvement in symptoms  Patient given exercises, stretches and lifestyle modifications  See medications in patient instructions if given  Patient will follow up in 4 weeks 

## 2017-07-16 ENCOUNTER — Telehealth: Payer: Self-pay | Admitting: Family Medicine

## 2017-07-16 NOTE — Telephone Encounter (Signed)
Pt called and was given results from 10/8,  She believes Katrinka BlazingSmith was supposed to call her in some anti inflammatory medication, please advise and call back

## 2017-07-20 MED ORDER — MELOXICAM 15 MG PO TABS: 15.0000 mg | ORAL_TABLET | Freq: Every day | ORAL | 0 refills | Status: DC

## 2017-07-20 NOTE — Telephone Encounter (Signed)
I sent in the gabapentin for nighttime pain  If she feels she needs anti-inflammatory I would send in either meloxicam or duexis if she would want.

## 2017-07-20 NOTE — Telephone Encounter (Signed)
meloxicam sent to patient's pharmacy

## 2017-08-03 ENCOUNTER — Telehealth: Payer: Self-pay | Admitting: Obstetrics and Gynecology

## 2017-08-03 DIAGNOSIS — Z30433 Encounter for removal and reinsertion of intrauterine contraceptive device: Secondary | ICD-10-CM

## 2017-08-03 NOTE — Telephone Encounter (Signed)
Patient would like an appointment to have iud removed. Last seen 04/17/16.

## 2017-08-03 NOTE — Telephone Encounter (Signed)
Spoke with patient, advised as seen below per Dr. Edward JollySilva. Patient thankful for return call and verbalizes understanding.   Order placed for Mirena IUD removal and Mirena IUD insertion.  Routing to provider for final review. Patient is agreeable to disposition. Will close encounter.  Cc: Harland DingwallSuzy Dixon

## 2017-08-03 NOTE — Telephone Encounter (Signed)
Spoke with patient. Patient request to have IUD removed and replaced. Mirena IUD placed 07/27/2012. Patient is currently SA. Cycles irregular with IUD, reports spotting on 08/02/17. Advised patient IUD expired. Advised patient UPT may need to be done first, abstain for 2 weeks and UPT day of exchange. Would like to review with Dr. Edward JollySilva prior to scheduling. Patient is due for AEX, last AEX on 04/23/16. Scheduled AEX for 08/04/17 at 8am with Dr. Edward JollySilva. Advised patient to abstain, can discuss plan at AEX. Will also review with Dr. Edward JollySilva and return call with any additional recommendations. Patient verbalizes understanding and is agreeable.  Dr. Edward JollySilva -please review and advise?

## 2017-08-03 NOTE — Telephone Encounter (Signed)
I am happy to see the patient for her annual exam.  She is likely still protected against pregnancy with her recently "expired" Mirena.  That being said, Mirena IUD can fail even during the first five years of use.  I recommend she abstain until her new IUD is placed. We will plan for a serum hCG on the day of her annual exam.  We will proceed with precert of IUD removal and insertion of new Mirena.  This may be able to be scheduled as soon as next week pending approval from her insurance.

## 2017-08-04 ENCOUNTER — Encounter: Payer: Self-pay | Admitting: Obstetrics and Gynecology

## 2017-08-04 ENCOUNTER — Other Ambulatory Visit: Payer: Self-pay

## 2017-08-04 ENCOUNTER — Ambulatory Visit: Payer: BLUE CROSS/BLUE SHIELD | Admitting: Obstetrics and Gynecology

## 2017-08-04 VITALS — BP 100/58 | HR 76 | Resp 20 | Ht 65.0 in | Wt 180.6 lb

## 2017-08-04 DIAGNOSIS — N939 Abnormal uterine and vaginal bleeding, unspecified: Secondary | ICD-10-CM

## 2017-08-04 DIAGNOSIS — Z01419 Encounter for gynecological examination (general) (routine) without abnormal findings: Secondary | ICD-10-CM | POA: Diagnosis not present

## 2017-08-04 NOTE — Patient Instructions (Signed)

## 2017-08-04 NOTE — Progress Notes (Signed)
31 y.o. 912P2002 [name] here for annual exam.    Having vaginal spotting for the first time in a long time.  Wants a new Mirena IUD.   Left leg hurts to walk.  Thinks she pulled a muscle at work.  Did heavy lifting.  She is a driver for [employer].  Stating excessive crying.  Feeling sensitive in her age.  Has depression and has taken medication in the past.  Prefers to deal with this through exercise. Not suicidal.    Declines STD testing.  Monogamous relationship.   PCP:  Laura Lingerhomas Jones, MD   Patient's last menstrual period was 08/02/2017 (exact date).     Period Cycle (Days): (no cycles due to Mirena IUD)     Sexually active: Yes.    female The current method of family planning is IUD--Mirena inserted 07-27-12.    Exercising: No.   Smoker:  Yes, 3 packs/week  Health Maintenance: Pap: 04-17-16 ASCUS :Neg HR HPV, 02-04-12 Neg History of abnormal Pap:  no MMG:  n/a Colonoscopy:  n/a BMD:   n/a  Result  n/a TDaP:  09/2014 Gardasil:   Unsure HIV: 03-17-12 NR Hep C: Maybe with Pregnancy Screening Labs:  Hb today: PCP, Urine today: not done   reports that she has been smoking cigarettes.  She has a 20.00 pack-year smoking history. she has never used smokeless tobacco. She reports that she uses drugs. Drug: Marijuana. Frequency: 1.00 time per week. She reports that she does not drink alcohol.  Past Medical History:  Diagnosis Date  . No pertinent past medical history   . Seasonal depression (HCC)     Past Surgical History:  Procedure Laterality Date  . NO PAST SURGERIES    . WISDOM TOOTH EXTRACTION      Current Outpatient Medications  Medication Sig Dispense Refill  . levonorgestrel (MIRENA) 20 MCG/24HR IUD 1 each by Intrauterine route once.     No current facility-administered medications for this visit.     Family History  Problem Relation Age of Onset  . Cancer Father   . Cancer Maternal Grandmother   . Breast cancer Maternal Grandmother   . Cancer  Paternal Uncle     ROS:  Pertinent items are noted in HPI.  Otherwise, a comprehensive ROS was negative.  Exam:   BP (!) 100/58 (BP Location: Right Arm, Patient Position: Sitting, Cuff Size: Normal)   Pulse 76   Resp 20   Ht 5\' 5"  (1.651 m)   Wt 180 lb 9.6 oz (81.9 kg)   LMP 08/02/2017 (Exact Date)   BMI 30.05 kg/m     General appearance: alert, cooperative and appears stated age Head: Normocephalic, without obvious abnormality, atraumatic Neck: no adenopathy, supple, symmetrical, trachea midline and thyroid normal to inspection and palpation Lungs: clear to auscultation bilaterally Breasts: normal appearance, no masses or tenderness, No nipple retraction or dimpling, No nipple discharge or bleeding, No axillary or supraclavicular adenopathy Heart: regular rate and rhythm Abdomen: soft, non-tender; no masses, no organomegaly.  Tender in left groin with no mass. Extremities: extremities normal, atraumatic, no cyanosis or edema Skin: Skin color, texture, turgor normal. No rashes or lesions Lymph nodes: Cervical, supraclavicular, and axillary nodes normal. No abnormal inguinal nodes palpated Neurologic: Grossly normal  Pelvic: External genitalia:  no lesions              Urethra:  normal appearing urethra with no masses, tenderness or lesions  Bartholins and Skenes: normal                 Vagina: normal appearing vagina with normal color and discharge, no lesions              Cervix: no lesions.  Manson PasseyBrown old blood.  IUD strings noted.              Pap taken: No. Bimanual Exam:  Uterus:  normal size, contour, position, consistency, mobility, non-tender              Adnexa: no mass, fullness, tenderness              Chaperone was present for exam.  Assessment:   Well woman visit with normal exam. Expired Mirena IUD.  Excessive crying.  Declines meds. Smoker.  Last pap ASCUS and negative HR HPV.  Muscle pull.  Plan: Mammogram screening discussed. Recommended self  breast awareness. Pap and HR HPV as above. Guidelines for Calcium, Vitamin D, regular exercise program including cardiovascular and weight bearing exercise. Quant beta hCG.  Will abstain until new Mirena IUD placed.  This is in precert.  She declines smoking cessation.  Motrin 600 mg po q 6 hours prn pain.  Heat or cold to area.  Follow up with PCP for continued pain.  I do not recommend a muscle relaxer which can cause sleepiness.   Follow up annually and prn.   After visit summary provided.

## 2017-08-05 LAB — BETA HCG QUANT (REF LAB): hCG Quant: <1 m[IU]/mL

## 2017-08-06 ENCOUNTER — Ambulatory Visit (INDEPENDENT_AMBULATORY_CARE_PROVIDER_SITE_OTHER): Payer: BLUE CROSS/BLUE SHIELD | Admitting: Obstetrics and Gynecology

## 2017-08-06 ENCOUNTER — Telehealth: Payer: Self-pay | Admitting: Obstetrics and Gynecology

## 2017-08-06 ENCOUNTER — Encounter: Payer: Self-pay | Admitting: Obstetrics and Gynecology

## 2017-08-06 DIAGNOSIS — Z30433 Encounter for removal and reinsertion of intrauterine contraceptive device: Secondary | ICD-10-CM

## 2017-08-06 NOTE — Telephone Encounter (Signed)
Patient declined to schedule 2 month recheck appointment at check out. Says she will call to schedule.

## 2017-08-06 NOTE — Progress Notes (Signed)
GYNECOLOGY  VISIT   HPI: 31 y.o.   Single  Caucasian  female   G2P2002 with Patient's last menstrual period was 08/02/2017 (exact date).   here for Mirena IUD exchange.    IUD expired.   Serum hCG negative on 08/04/17.  GYNECOLOGIC HISTORY: Patient's last menstrual period was 08/02/2017 (exact date). Contraception:  Mirena IUD inserted 07-27-12 Menopausal hormone therapy: none Last mammogram:  n/a Last pap smear:  04-17-16 ASCUS :Neg HR HPV, 02-04-12 Neg        OB History    Gravida Para Term Preterm AB Living   2 2 2  0 0 2   SAB TAB Ectopic Multiple Live Births   0 0 0 0 2         Patient Active Problem List   Diagnosis Date Noted  . 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 Radiculitis of left cervical region 06/25/2017  . 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 Back pain 05/25/2016  . Bursitis of right shoulder 02/10/2016  . Nonallopathic lesion of sacral region 10/28/2015  . Nonallopathic lesion of thoracic region 10/28/2015  . 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 Allergic rhinitis 10/08/2015  . Polycythemia 05/30/2015  . Nonallopathic lesion of lumbosacral region 04/09/2015  . Routine general medical examination at a health care facility 03/11/2015  . Tobacco use disorder 02/28/2015  . Encounter for insertion of mirena IUD 07/27/2012    Past Medical History:  Diagnosis Date  . No pertinent past medical history   . Seasonal depression (HCC)     Past Surgical History:  Procedure Laterality Date  . NO PAST SURGERIES    . WISDOM TOOTH EXTRACTION      Current Outpatient Medications  Medication Sig Dispense Refill  . levonorgestrel (MIRENA) 20 MCG/24HR IUD 1 each by Intrauterine route once.     No current facility-administered medications for this visit.      ALLERGIES: Pennsaid [diclofenac sodium]  Family History  Problem Relation Age of Onset  . Cancer Father   . Cancer Maternal Grandmother   . Breast cancer Maternal Grandmother   . Cancer Paternal Uncle      Social History   Socioeconomic History  . Marital status: Single    Spouse name: Not on file  . Number of children: 2  . Years of education: 6112  . Highest education level: Not on file  Social Needs  . Financial resource strain: Not on file  . Food insecurity - worry: Not on file  . Food insecurity - inability: Not on file  . Transportation needs - medical: Not on file  . Transportation needs - non-medical: Not on file  Occupational History  . Occupation: Academic librarianackage Handler  Tobacco Use  . Smoking status: Current Every Day Smoker    Packs/day: 1.00    Years: 20.00    Pack years: 20.00    Types: Cigarettes  . Smokeless tobacco: Never Used  Substance and Sexual Activity  . Alcohol use: No    Frequency: Never    Comment: occasionally  . Drug use: Yes    Frequency: 1.0 times per week    Types: Marijuana    Comment: Rarely  . Sexual activity: Yes    Partners: Male    Birth control/protection: IUD  Other Topics Concern  . Not on file  Social History Narrative  . Not on file    ROS:  Pertinent items are noted in HPI.  PHYSICAL EXAMINATION:    BP 122/68 (BP Location: Right Arm, Patient Position: Sitting,  Cuff Size: Normal)   Pulse 70   Ht 5\' 5"  (1.651 m)   Wt 181 lb 9.6 oz (82.4 kg)   LMP 08/02/2017 (Exact Date)   BMI 30.22 kg/m     General appearance: alert, cooperative and appears stated age    Pelvic: External genitalia:  no lesions              Urethra:  normal appearing urethra with no masses, tenderness or lesions              Bartholins and Skenes: normal                 Vagina: normal appearing vagina with normal color and discharge, no lesions              Cervix: no lesions.  IUD strings noted.                Bimanual Exam:  Uterus:  normal size, contour, position, consistency, mobility, non-tender              Adnexa: no mass, fullness, tenderness           Mirena IUD exchange.  Consent for removal and reinsertion of new Mirena IUD. Mirena lot  number [id number], expiration Apr 2021.  Sterile prep with Hibiclens.  Tenaculum to anterior cervical lip.  Uterus sounded to almost 7.5 cm.  Mirena placed without difficulty.  Strings trimmed to 2.5 cm.  Repeat BM exam - no change.  Minimal EBL.  No complications.   Chaperone was present for exam.  ASSESSMENT  Mirena IUD removal and reinsertion of new Mirena.  PLAN  Instructions and precautions given.  Use back up pregnancy prevention for 1 week.  Follow up in 2 months (per patient preference).    An After Visit Summary was printed and given to the patient.

## 2017-08-06 NOTE — Telephone Encounter (Signed)
No problem that she did not schedule her IUD check.  She indicated it is a very busy time of year at work.   Encounter closed.

## 2017-08-06 NOTE — Telephone Encounter (Signed)
Routing to Dr. Silva FYI, will close encounter.  

## 2017-08-06 NOTE — Patient Instructions (Signed)

## 2017-09-11 ENCOUNTER — Emergency Department (HOSPITAL_COMMUNITY): Payer: BLUE CROSS/BLUE SHIELD

## 2017-09-11 ENCOUNTER — Emergency Department (HOSPITAL_COMMUNITY)
Admission: EM | Admit: 2017-09-11 | Discharge: 2017-09-11 | Disposition: A | Discharge status: Home / Self Care | Payer: BLUE CROSS/BLUE SHIELD | Attending: Emergency Medicine | Admitting: Emergency Medicine

## 2017-09-11 ENCOUNTER — Ambulatory Visit: Payer: BLUE CROSS/BLUE SHIELD | Admitting: Internal Medicine

## 2017-09-11 ENCOUNTER — Encounter (HOSPITAL_COMMUNITY): Payer: Self-pay | Admitting: Emergency Medicine

## 2017-09-11 DIAGNOSIS — J069 Acute upper respiratory infection, unspecified: Secondary | ICD-10-CM | POA: Diagnosis not present

## 2017-09-11 DIAGNOSIS — F17210 Nicotine dependence, cigarettes, uncomplicated: Secondary | ICD-10-CM | POA: Diagnosis not present

## 2017-09-11 DIAGNOSIS — R05 Cough: Secondary | ICD-10-CM | POA: Diagnosis present

## 2017-09-11 MED ORDER — IBUPROFEN 400 MG PO TABS
400.0000 mg | ORAL_TABLET | Freq: Once | ORAL | Status: AC
  Administered 2017-09-11: 400 mg via ORAL

## 2017-09-11 MED ORDER — DOXYCYCLINE HYCLATE 100 MG PO CAPS: 100.0000 mg | ORAL_CAPSULE | Freq: Two times a day (BID) | ORAL | 0 refills | Status: DC

## 2017-09-11 MED ORDER — BENZONATATE 100 MG PO CAPS: 100.0000 mg | ORAL_CAPSULE | Freq: Every evening | ORAL | 0 refills | Status: DC | PRN

## 2017-09-11 NOTE — Discharge Instructions (Signed)
It was my pleasure taking care of you today!   Please take all of your antibiotics until finished!   Tessalon at night as needed for cough. Ibuprofen as needed for body aches.   Rest, drink plenty of fluids to be sure you are staying hydrated.   Please follow up with your primary doctor for discussion of your diagnoses and further evaluation after today's visit if symptoms persist longer than 7 days; Return to the ER for high fevers, difficulty breathing or other concerning symptoms

## 2017-09-11 NOTE — ED Triage Notes (Signed)
Pt reports cough with increased pain over the last two weeks. Pt reports she did have a productive cough and felt warm earlier this week, but these symptoms have resolved and now just has painful dry cough. States she has appointment with PCP at 11AM today but cannot wait. Hx smoking

## 2017-09-11 NOTE — ED Provider Notes (Signed)
[facility] EMERGENCY [facility]PARTMENT Provider Note   CSN: [id number] Arrival date & time: 09/11/17  0343     History   Chief Complaint Chief Complaint  Patient presents with  . Cough    HPI Laura Guerra is a 31 y.o. female.  The history is provided by the patient and medical records. No language interpreter was used.   Laura Guerra is a 31 y.o. female  with a PMH as listed below who presents to the Emergency Department complaining of persistent intermittent productive cough x 1-2 weeks. Associated with nasal congestion, body aches and subjective fever at home (felt warm). OTC cold meds with no improvement. Body aches worse with coughing fits. Current pack per day smoker. No hx of asthma / copd.    Past Medical History:  Diagnosis Date  . No pertinent past medical history   . Seasonal depression [facility](HCC)     Patient Active Problem List   Diagnosis Date Noted  . 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 Radiculitis of left cervical region 06/25/2017  . 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 Back pain 05/25/2016  . Bursitis of right shoulder 02/10/2016  . Nonallopathic lesion of sacral region 10/28/2015  . Nonallopathic lesion of thoracic region 10/28/2015  . 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 Allergic rhinitis 10/08/2015  . Polycythemia 05/30/2015  . Nonallopathic lesion of lumbosacral region 04/09/2015  . Routine general medical examination at a health care facility 03/11/2015  . Tobacco use disorder 02/28/2015  . Encounter for insertion of mirena IUD 07/27/2012    Past Surgical History:  Procedure Laterality Date  . NO PAST SURGERIES    . WISDOM TOOTH EXTRACTION      OB History    Gravida Para Term Preterm AB Living   2 2 2  0 0 2   SAB TAB Ectopic Multiple Live Births   0 0 0 0 2       Home Medications    Prior to Admission medications   Medication Sig Start Date End Date Taking? Authorizing Provider  benzonatate (TESSALON) 100 MG  capsule Take 1 capsule (100 mg total) by mouth at bedtime as needed for cough. 09/11/17   Vashon Riordan, Chase PicketJaime Pilcher, PA-C  doxycycline (VIBRAMYCIN) 100 MG capsule Take 1 capsule (100 mg total) by mouth 2 (two) times daily. 09/11/17   Elgar Scoggins, Chase PicketJaime Pilcher, PA-C  levonorgestrel (MIRENA) 20 MCG/24HR IUD 1 each by Intrauterine route once.    [provider]    Family History Family History  Problem Relation Age of Onset  . Cancer Father   . Cancer Maternal Grandmother   . Breast cancer Maternal Grandmother   . Cancer Paternal Uncle     Social History Social History   Tobacco Use  . Smoking status: Current Every Day Smoker    Packs/day: 1.00    Years: 20.00    Pack years: 20.00    Types: Cigarettes  . Smokeless tobacco: Never Used  Substance Use Topics  . Alcohol use: No    Frequency: Never    Comment: occasionally  . Drug use: Yes    Frequency: 1.0 times per week    Types: Marijuana    Comment: Rarely     Allergies   Pennsaid [diclofenac sodium]   Review of Systems Review of Systems  HENT: Positive for congestion. Negative for sore throat.   Respiratory: Positive for cough. Negative for chest tightness, shortness of breath and wheezing.   All other systems reviewed and are negative.  Physical Exam Updated Vital Signs BP 110/71 (BP Location: Right Arm)   Pulse 71   Temp 98.1 F (36.7 C) (Oral)   Resp 16   Ht 5\' 5"  (1.651 m)   Wt 81.6 kg (180 lb)   SpO2 96%   BMI 29.95 kg/m   Physical Exam  Constitutional: She is oriented to person, place, and time. She appears well-developed and well-nourished. No distress.  HENT:  Head: Normocephalic and atraumatic.  OP with erythema, no exudates or tonsillar hypertrophy. + nasal congestion with mucosal edema. No focal sinus tenderness.  Neck: Normal range of motion. Neck supple.  No meningeal signs.   Cardiovascular: Normal rate, regular rhythm and normal heart sounds.  Pulmonary/Chest: Effort normal.  Lungs are  clear to auscultation bilaterally - no w/r/r  Abdominal: Soft. She exhibits no distension. There is no tenderness.  Musculoskeletal: Normal range of motion.  Neurological: She is alert and oriented to person, place, and time.  Skin: Skin is warm and dry. She is not diaphoretic.  Nursing note and vitals reviewed.    ED Treatments / Results  Labs (all labs ordered are listed, but only abnormal results are displayed) Labs Reviewed - No data to display  EKG  EKG Interpretation None       Radiology Dg Chest 2 View  Result Date: 09/11/2017 CLINICAL DATA:  Subacute onset of productive cough and generalized chest pain. EXAM: CHEST  2 VIEW COMPARISON:  Chest radiograph performed 05/30/2015 FINDINGS: The lungs are well-aerated. Peribronchial thickening is noted. There is no evidence of focal opacification, pleural effusion or pneumothorax. The heart is normal in size; the mediastinal contour is within normal limits. No acute osseous abnormalities are seen. IMPRESSION: Peribronchial thickening noted.  Lungs otherwise clear. Electronically Signed   By: Roanna RaiderJeffery  Chang M.D.   On: 09/11/2017 05:46    Procedures Procedures (including critical care time)  Medications Ordered in ED Medications  ibuprofen (ADVIL,MOTRIN) tablet 400 mg (400 mg Oral Given 09/11/17 0352)     Initial Impression / Assessment and Plan / ED Course  I have reviewed the triage vital signs and the nursing notes.  Pertinent labs & imaging results that were available during my care of the patient were reviewed by me and considered in my medical decision making (see chart for details).     Laura Guerra is a 31 y.o. female who presents to ED for intermittent productive cough x 2 weeks, acutely worsened over the last few days.   On exam, patient is afebrile, non-toxic appearing with a clear lung exam. Mild rhinorrhea and OP with erythema but no exudates or tonsillar hypertrophy.  CXR with peribronchial thickening  noted.   Given prolonged symptoms with acute worsening and current pack per day smoker, will treat with doxycyline.  Symptomatic home care instructions discussed. PCP follow up strongly encouraged if symptoms persist. Reasons to return to ER discussed. All questions answered.      Final Clinical Impressions(s) / ED Diagnoses   Final diagnoses:  Upper respiratory tract infection, unspecified type    ED Discharge Orders        Ordered    doxycycline (VIBRAMYCIN) 100 MG capsule  2 times daily     09/11/17 0753    benzonatate (TESSALON) 100 MG capsule  At bedtime PRN     09/11/17 0753       B[place], Chase PicketJaime Pilcher, PA-C 09/11/17 0759    Lorre NickAllen, Anthony, MD 09/11/17 [id number]

## 2017-12-23 ENCOUNTER — Encounter: Payer: Self-pay | Admitting: Internal Medicine

## 2017-12-23 ENCOUNTER — Ambulatory Visit: Payer: BLUE CROSS/BLUE SHIELD | Admitting: Internal Medicine

## 2017-12-23 ENCOUNTER — Ambulatory Visit (INDEPENDENT_AMBULATORY_CARE_PROVIDER_SITE_OTHER)
Admission: RE | Admit: 2017-12-23 | Discharge: 2017-12-23 | Disposition: A | Discharge status: Home / Self Care | Payer: BLUE CROSS/BLUE SHIELD | Source: Ambulatory Visit | Attending: Internal Medicine | Admitting: Internal Medicine

## 2017-12-23 VITALS — BP 112/74 | HR 91 | Temp 97.8°F | Ht 65.0 in | Wt 188.0 lb

## 2017-12-23 DIAGNOSIS — J069 Acute upper respiratory infection, unspecified: Secondary | ICD-10-CM | POA: Diagnosis not present

## 2017-12-23 DIAGNOSIS — J351 Hypertrophy of tonsils: Secondary | ICD-10-CM

## 2017-12-23 DIAGNOSIS — R071 Chest pain on breathing: Secondary | ICD-10-CM | POA: Diagnosis not present

## 2017-12-23 DIAGNOSIS — R079 Chest pain, unspecified: Secondary | ICD-10-CM | POA: Insufficient documentation

## 2017-12-23 MED ORDER — HYDROCODONE-HOMATROPINE 5-1.5 MG/5ML PO SYRP: 5.0000 mL | ORAL_SOLUTION | Freq: Four times a day (QID) | ORAL | 0 refills | Status: AC | PRN

## 2017-12-23 MED ORDER — LEVOFLOXACIN 500 MG PO TABS: 500.0000 mg | ORAL_TABLET | Freq: Every day | ORAL | 0 refills | Status: AC

## 2017-12-23 NOTE — Progress Notes (Signed)
   Subjective:    Patient ID: Laura Guerra, female    DOB: 15-Aug-1986, 32 y.o.   MRN: [id number]  HPI   Here with 2-3 days acute onset fever, facial pain, pressure, headache, general weakness and malaise, and greenish d/c, with mild ST and cough assoc also with left sided mid anterior sharp pleuritic CP, but pt denies wheezing, increased sob or doe, orthopnea, PND, increased LE swelling, palpitations, dizziness or syncope.  Does have ongoing large tonsils maybe worse in last few days.   Pt denies polydipsia, polyuria.  No other interval hx  Past Medical History:  Diagnosis Date  . No pertinent past medical history   . Seasonal depression (HCC)    Past Surgical History:  Procedure Laterality Date  . NO PAST SURGERIES    . WISDOM TOOTH EXTRACTION      reports that she has been smoking cigarettes.  She has a 20.00 pack-year smoking history. She has never used smokeless tobacco. She reports that she has current or past drug history. Drug: Marijuana. Frequency: 1.00 time per week. She reports that she does not drink alcohol. family history includes Breast cancer in her maternal grandmother; Cancer in her father, maternal grandmother, and paternal uncle. Allergies  Allergen Reactions  . Pennsaid [Diclofenac Sodium]     Local rash   Current Outpatient Medications on File Prior to Visit  Medication Sig Dispense Refill  . benzonatate (TESSALON) 100 MG capsule Take 1 capsule (100 mg total) by mouth at bedtime as needed for cough. 14 capsule 0  . doxycycline (VIBRAMYCIN) 100 MG capsule Take 1 capsule (100 mg total) by mouth 2 (two) times daily. 20 capsule 0  . levonorgestrel (MIRENA) 20 MCG/24HR IUD 1 each by Intrauterine route once.     No current facility-administered medications on file prior to visit.    Review of Systems  Constitutional: Negative for other unusual diaphoresis or sweats HENT: Negative for ear discharge or swelling Eyes: Negative for other worsening visual  disturbances Respiratory: Negative for stridor or other swelling  Gastrointestinal: Negative for worsening distension or other blood Genitourinary: Negative for retention or other urinary change Musculoskeletal: Negative for other MSK pain or swelling Skin: Negative for color change or other new lesions Neurological: Negative for worsening tremors and other numbness  Psychiatric/Behavioral: Negative for worsening agitation or other fatigue All other system neg per pt    Objective:   Physical Exam BP 112/74   Pulse 91   Temp 97.8 F (36.6 C) (Oral)   Ht 5\' 5"  (1.651 m)   Wt 188 lb (85.3 kg)   SpO2 97%   BMI 31.28 kg/m  VS noted, mild ill Constitutional: Pt appears in NAD HENT: Head: NCAT.  Right Ear: External ear normal.  Left Ear: External ear normal.  Bilat tm's with mild erythema.  Max sinus areas mild tender.  Pharynx with mild erythema, no exudate, pharynx with large tonsil and right exudate Eyes: . Pupils are equal, round, and reactive to light. Conjunctivae and EOM are normal Nose: without d/c or deformity Neck: Neck supple. Gross normal ROM Cardiovascular: Normal rate and regular rhythm.   Pulmonary/Chest: Effort normal and breath sounds without rales or wheezing.  Neurological: Pt is alert. At baseline orientation, motor grossly intact Skin: Skin is warm. No rashes, other new lesions, no LE edema Psychiatric: Pt behavior is normal without agitation  No other exam findings     Assessment & Plan:

## 2017-12-23 NOTE — Patient Instructions (Signed)
Please take all new medication as prescribed - the antibiotic, and cough medicine if needed   Please continue all other medications as before, and refills have been done if requested.  Please have the pharmacy call with any other refills you may need.  Please keep your appointments with your specialists as you may have planned  Please go to the XRAY Department in the Basement (go straight as you get off the elevator) for the x-ray testing  You will be contacted by phone if any changes need to be made immediately.  Otherwise, you will receive a letter about your results with an explanation, but please check with MyChart first.  Please remember to sign up for MyChart if you have not done so, as this will be important to you in the future with finding out test results, communicating by private email, and scheduling acute appointments online when needed.    

## 2017-12-25 ENCOUNTER — Encounter: Payer: Self-pay | Admitting: Internal Medicine

## 2017-12-25 DIAGNOSIS — J351 Hypertrophy of tonsils: Secondary | ICD-10-CM | POA: Insufficient documentation

## 2017-12-25 DIAGNOSIS — J069 Acute upper respiratory infection, unspecified: Secondary | ICD-10-CM | POA: Insufficient documentation

## 2017-12-25 NOTE — Assessment & Plan Note (Signed)
C/w pleurisy vs msk, for cxr,  to f/u any worsening symptoms or concerns

## 2017-12-25 NOTE — Assessment & Plan Note (Signed)
Mild to mod, for antibx course,  to f/u any worsening symptoms or concerns 

## 2017-12-25 NOTE — Assessment & Plan Note (Signed)
Acute on chronic, denies snoring or hypersomnolence, for tx as above, consider ENT if not improved

## 2018-04-19 ENCOUNTER — Encounter: Payer: Self-pay | Admitting: Family

## 2018-04-19 ENCOUNTER — Ambulatory Visit: Payer: BLUE CROSS/BLUE SHIELD | Admitting: Family

## 2018-04-19 VITALS — BP 128/78 | HR 72 | Temp 98.3°F | Ht 65.0 in | Wt 185.6 lb

## 2018-04-19 DIAGNOSIS — M5412 Radiculopathy, cervical region: Secondary | ICD-10-CM | POA: Diagnosis not present

## 2018-04-19 MED ORDER — METHOCARBAMOL 500 MG PO TABS: 500.0000 mg | ORAL_TABLET | Freq: Three times a day (TID) | ORAL | 0 refills | Status: DC | PRN

## 2018-04-19 MED ORDER — PREDNISONE 10 MG PO TABS: ORAL_TABLET | ORAL | 0 refills | Status: DC

## 2018-04-19 NOTE — Progress Notes (Signed)
Laura Guerra is a 32 y.o. female with the following history as recorded in EpicCare:  Patient Active Problem List   Diagnosis Date Noted  . Acute upper respiratory infection 12/25/2017  . Enlarged tonsils 12/25/2017  . Chest pain 12/23/2017  . 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 Radiculitis of left cervical region 06/25/2017  . 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 Back pain 05/25/2016  . Bursitis of right shoulder 02/10/2016  . Nonallopathic lesion of sacral region 10/28/2015  . Nonallopathic lesion of thoracic region 10/28/2015  . 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 Allergic rhinitis 10/08/2015  . Polycythemia 05/30/2015  . Nonallopathic lesion of lumbosacral region 04/09/2015  . Routine general medical examination at a health care facility 03/11/2015  . Tobacco use disorder 02/28/2015  . Encounter for insertion of mirena IUD 07/27/2012    Current Outpatient Medications  Medication Sig Dispense Refill  . levonorgestrel (MIRENA) 20 MCG/24HR IUD 1 each by Intrauterine route once.    . methocarbamol (ROBAXIN) 500 MG tablet Take 1 tablet (500 mg total) by mouth every 8 (eight) hours as needed. For muscle spasm 30 tablet 0  . predniSONE (DELTASONE) 10 MG tablet 3 tabs by mouth per day for 3 days,2tabs per day for 3 days,1tab per day for 3 days 18 tablet 0   No current facility-administered medications for this visit.     Allergies: Pennsaid [diclofenac sodium]  Past Medical History:  Diagnosis Date  . No pertinent past medical history   . Seasonal depression (HCC)     Past Surgical History:  Procedure Laterality Date  . NO PAST SURGERIES    . WISDOM TOOTH EXTRACTION      Family History  Problem Relation Age of Onset  . Cancer Father   . Cancer Maternal Grandmother   . Breast cancer Maternal Grandmother   . Cancer Paternal Uncle     Social History   Tobacco Use  . Smoking status: Current Every Day Smoker    Packs/day: 1.00    Years: 20.00    Pack years: 20.00   Types: Cigarettes  . Smokeless tobacco: Never Used  Substance Use Topics  . Alcohol use: No    Frequency: Never    Comment: occasionally    Subjective:  Patient presents with left neck/ shoulder pain x 4 days; feels pain radiating down into hand; symptoms started after pushing heavy dolly at work- works for [employer]; using OTC Tylenol and old muscle relaxers with no benefit; + headache; sleep affected due to pain;  Objective:  Vitals:   04/19/18 1111  BP: 128/78  Pulse: 72  Temp: 98.3 F (36.8 C)  TempSrc: Oral  SpO2: 97%  Weight: 185 lb 9.6 oz (84.2 kg)  Height: 5\' 5"  (1.651 m)    General: Well developed, well nourished, in no acute distress  Skin : Warm and dry.  Head: Normocephalic and atraumatic  Lungs: Respirations unlabored; clear to auscultation bilaterally without wheeze, rales, rhonchi  Musculoskeletal: No deformities; no active joint inflammation  Extremities: No edema, cyanosis, clubbing  Vessels: Symmetric bilaterally  Neurologic: Alert and oriented; speech intact; face symmetrical; moves all extremities well; CNII-XII intact without focal deficit  Assessment:  1. Cervical radiculopathy     Plan:  Rx for Prednisone taper- take as directed; Rx for Robaxin; work note x 3 days as requested; follow-up worse, no better- may need to refer back to sports medicine.   No follow-ups on file.  No orders of the defined types were placed in this encounter.  Requested Prescriptions   Signed Prescriptions Disp Refills  . predniSONE (DELTASONE) 10 MG tablet 18 tablet 0    Sig: 3 tabs by mouth per day for 3 days,2tabs per day for 3 days,1tab per day for 3 days  . methocarbamol (ROBAXIN) 500 MG tablet 30 tablet 0    Sig: Take 1 tablet (500 mg total) by mouth every 8 (eight) hours as needed. For muscle spasm

## 2018-04-22 ENCOUNTER — Telehealth: Payer: Self-pay | Admitting: Internal Medicine

## 2018-04-22 NOTE — Telephone Encounter (Signed)
Laura Guerra was not here this afternoon. I sent note through MyChart instead to patient.

## 2018-04-22 NOTE — Telephone Encounter (Signed)
Copied from CRM [id number]. Topic: Quick Communication - See Telephone Encounter >> Apr 22, 2018  3:10 PM Floria RavelingStovall, Shana A wrote: CRM for notification. See Telephone encounter for: 04/22/18.  Pt called in and stated she was seen on Tuesday by Vernona RiegerLaura.  Pt stated she wrote her out of work for [employer] and wed.  Vernona RiegerLaura told her to call back if she needed an additional woke not to be written out for Friday night for her arm pain.

## 2018-04-22 NOTE — Telephone Encounter (Signed)
If note is done, [facility] per pt request.

## 2018-04-25 NOTE — Telephone Encounter (Signed)
Called and left message for patient that note was completed and sent to her via mychart since Laura Guerra was already gone by the time her note was completed and message was received.

## 2018-05-19 ENCOUNTER — Encounter: Payer: Self-pay | Admitting: Internal Medicine

## 2018-05-19 ENCOUNTER — Ambulatory Visit: Payer: BLUE CROSS/BLUE SHIELD | Admitting: Internal Medicine

## 2018-05-19 DIAGNOSIS — M79644 Pain in right finger(s): Secondary | ICD-10-CM | POA: Diagnosis not present

## 2018-05-19 MED ORDER — CEPHALEXIN 500 MG PO CAPS: 500.0000 mg | ORAL_CAPSULE | Freq: Two times a day (BID) | ORAL | 0 refills | Status: DC

## 2018-05-19 NOTE — Patient Instructions (Signed)
We have sent in keflex to prevent infection. Take 1 pill twice a day for 5 days.

## 2018-05-19 NOTE — Progress Notes (Signed)
   Subjective:    Patient ID: Laura Guerra, female    DOB: 1986/07/19, 32 y.o.   MRN: [id number]  HPI The patient is a 32 YO female coming in for sensation of something in her right thumb. Several weeks ago she had an injury with possible thorn or splinter and tried to get all fragments out. She then has been digging at it some and is having pain. She had a scab forming and used nail clippers to remove this scab so she could get to the area. She is having pain under the nail and is concerned this is where the area could be. Some swelling in the area. Denies fevers or chills. No rash on the skin.   Review of Systems  Constitutional: Negative.   Respiratory: Negative for cough, chest tightness and shortness of breath.   Cardiovascular: Negative for chest pain, palpitations and leg swelling.  Gastrointestinal: Negative for abdominal distention, abdominal pain, constipation, diarrhea, nausea and vomiting.  Musculoskeletal: Negative.   Skin: Positive for color change and wound.  Neurological: Negative.       Objective:   Physical Exam  Constitutional: She is oriented to person, place, and time. She appears well-developed and well-nourished.  HENT:  Head: Normocephalic and atraumatic.  Eyes: EOM are normal.  Neck: Normal range of motion.  Cardiovascular: Normal rate and regular rhythm.  Pulmonary/Chest: Effort normal and breath sounds normal. No respiratory distress. She has no wheezes. She has no rales.  Abdominal: Soft. Bowel sounds are normal. She exhibits no distension. There is no tenderness. There is no rebound.  Musculoskeletal: She exhibits no edema.  Neurological: She is alert and oriented to person, place, and time. Coordination normal.  Skin: Skin is warm and dry.  Sore right thumb with some visible blood vessel endings, no visible foreign body and exploration on exam reveals no fragments. Tenderness with palpation of the wound and nail. No swelling of the joints or pain on  the joints.    Vitals:   05/19/18 1605  BP: 120/84  Pulse: 72  Temp: 98.5 F (36.9 C)  TempSrc: Oral  SpO2: 96%  Height: 5\' 5"  (1.651 m)      Assessment & Plan:

## 2018-05-20 DIAGNOSIS — M79644 Pain in right finger(s): Secondary | ICD-10-CM | POA: Insufficient documentation

## 2018-05-20 NOTE — Assessment & Plan Note (Signed)
Rx for keflex to cover for infection. Could have been introduction with exploration of wound. No foreign body detected but we discussed how this would be extruded by the body and some inflammation can be present from the body's immune system. Call back if worsening.

## 2018-06-08 ENCOUNTER — Encounter: Payer: BLUE CROSS/BLUE SHIELD | Admitting: Internal Medicine

## 2018-12-23 ENCOUNTER — Telehealth: Payer: Self-pay | Admitting: *Deleted

## 2018-12-23 NOTE — Telephone Encounter (Signed)
Called and LVM to inform patient that her physical with Dr. Yetta Barre on 12/26/18 will be cancelled and that she will need to call and reschedule that visit out into the future. It was explained that a virtual visit would not be an option and the phone number was provided for her to call scheduling.

## 2018-12-23 NOTE — Telephone Encounter (Signed)
Called patient and LVM to inform them the nurse needs to either convert their upcoming physical appointment with PCP to a virtual visit or reschedule the visit out into the future due to covid-19 safety measures. Nurse requested that the patient call-back and stated she would call them back at a later date.

## 2018-12-26 ENCOUNTER — Encounter: Payer: BLUE CROSS/BLUE SHIELD | Admitting: Internal Medicine

## 2018-12-28 ENCOUNTER — Other Ambulatory Visit: Payer: Self-pay | Admitting: Internal Medicine

## 2018-12-28 MED ORDER — METHOCARBAMOL 500 MG PO TABS: 500.0000 mg | ORAL_TABLET | Freq: Three times a day (TID) | ORAL | 0 refills | Status: DC | PRN

## 2018-12-28 NOTE — Telephone Encounter (Signed)
Ok to refill 

## 2019-04-02 NOTE — Progress Notes (Deleted)
Laura ScaleZach Telesha Guerra D.O. [facility] [address] [place], [place] [address] Phone: [phone] Subjective:    I'm seeing this patient by the request  of:    CC: Shoulder pain  BJY:[id number]:Subjective  Laura Guerra is a 33 y.o. female coming in with complaint of ***  Onset-  Location Duration-  Character- Aggravating factors- Reliving factors-  Therapies tried-  Severity-     Past Medical History:  Diagnosis Date  . No pertinent past medical history   . Seasonal depression (HCC)    Past Surgical History:  Procedure Laterality Date  . NO PAST SURGERIES    . WISDOM TOOTH EXTRACTION     Social History   Socioeconomic History  . Marital status: Single    Spouse name: Not on file  . Number of children: 2  . Years of education: 412  . Highest education level: Not on file  Occupational History  . Occupation: Academic librarianackage Handler  Social Needs  . Financial resource strain: Not on file  . Food insecurity    Worry: Not on file    Inability: Not on file  . Transportation needs    Medical: Not on file    Non-medical: Not on file  Tobacco Use  . Smoking status: Current Every Day Smoker    Packs/day: 1.00    Years: 20.00    Pack years: 20.00    Types: Cigarettes  . Smokeless tobacco: Never Used  Substance and Sexual Activity  . Alcohol use: No    Frequency: Never    Comment: occasionally  . Drug use: Yes    Frequency: 1.0 times per week    Types: Marijuana    Comment: Rarely  . Sexual activity: Yes    Partners: Male    Birth control/protection: I.U.D.  Lifestyle  . Physical activity    Days per week: Not on file    Minutes per session: Not on file  . Stress: Not on file  Relationships  . Social Musicianconnections    Talks on phone: Not on file    Gets together: Not on file    Attends religious service: Not on file    Active member of club or organization: Not on file    Attends meetings of clubs or organizations: Not on file    Relationship status: Not on  file  Other Topics Concern  . Not on file  Social History Narrative  . Not on file   Allergies  Allergen Reactions  . Pennsaid [Diclofenac Sodium]     Local rash   Family History  Problem Relation Age of Onset  . Cancer Father   . Cancer Maternal Grandmother   . Breast cancer Maternal Grandmother   . Cancer Paternal Uncle     Current Outpatient Medications (Endocrine & Metabolic):  .  levonorgestrel (MIRENA) 20 MCG/24HR IUD, 1 each by Intrauterine route once.      Current Outpatient Medications (Other):  .  cephALEXin (KEFLEX) 500 MG capsule, Take 1 capsule (500 mg total) by mouth 2 (two) times daily. .  methocarbamol (ROBAXIN) 500 MG tablet, Take 1 tablet (500 mg total) by mouth every 8 (eight) hours as needed. For muscle spasm    Past medical history, social, surgical and family history all reviewed in electronic medical record.  No pertanent information unless stated regarding to the chief complaint.   Review of Systems:  No headache, visual changes, nausea, vomiting, diarrhea, constipation, dizziness, abdominal pain, skin rash, fevers, chills, night sweats, weight  loss, swollen lymph nodes, body aches, joint swelling, muscle aches, chest pain, shortness of breath, mood changes.   Objective  There were no vitals taken for this visit. Systems examined below as of    General: No apparent distress alert and oriented x3 mood and affect normal, dressed appropriately.  HEENT: Pupils equal, extraocular movements intact  Respiratory: Patient's speak in full sentences and does not appear short of breath  Cardiovascular: No lower extremity edema, non tender, no erythema  Skin: Warm dry intact with no signs of infection or rash on extremities or on axial skeleton.  Abdomen: Soft nontender  Neuro: Cranial nerves II through XII are intact, neurovascularly intact in all extremities with 2+ DTRs and 2+ pulses.  Lymph: No lymphadenopathy of posterior or anterior cervical chain or  axillae bilaterally.  Gait normal with good balance and coordination.  MSK:  Non tender with full range of motion and good stability and symmetric strength and tone of elbows, wrist, hip, knee and ankles bilaterally.  Shoulder: Inspection reveals no abnormalities, atrophy or asymmetry. Palpation is normal with no tenderness over AC joint or bicipital groove. ROM is full in all planes. Rotator cuff strength normal throughout. No signs of impingement with negative Neer and Hawkin's tests, empty can sign. Speeds and Yergason's tests normal. No labral pathology noted with negative Obrien's, negative clunk and good stability. Normal scapular function observed. No painful arc and no drop arm sign. No apprehension sign Contralateral shoulder unremarkable  Neck: Inspection unremarkable. No palpable stepoffs. Negative Spurling's maneuver. Full neck range of motion Grip strength and sensation normal in bilateral hands Strength good C4 to T1 distribution No sensory change to C4 to T1 Negative Hoffman sign bilaterally Reflexes normal       Impression and Recommendations:     This case required medical decision making of moderate complexity. The above documentation has been reviewed and is accurate and complete Lyndal Pulley, DO       Note: This dictation was prepared with Dragon dictation along with smaller phrase technology. Any transcriptional errors that result from this process are unintentional.

## 2019-04-03 ENCOUNTER — Ambulatory Visit: Payer: BLUE CROSS/BLUE SHIELD | Admitting: Family Medicine

## 2019-04-09 NOTE — Progress Notes (Signed)
Tawana ScaleZach Ailana Cuadrado D.O. [facility] [address] [place], [place] [address] Phone: [phone] Subjective:   [name], [name], am serving as a scribe for Dr. Antoine PrimasZachary Makeba Delcastillo.    CC: Shoulder pain  NGE:[id number]:Subjective  Laura Guerra is a 33 y.o. female coming in with complaint of lower back pain. Was doing martial arts and was slammed down to the mat. Injury one week ago. Stiffness in back area more than pain. Patient is trying to be more active recently.  Patient states is more of a dull, throbbing aching sensation.  Not doing with any exercises as she feels.     Past Medical History:  Diagnosis Date  . No pertinent past medical history   . Seasonal depression (HCC)    Past Surgical History:  Procedure Laterality Date  . NO PAST SURGERIES    . WISDOM TOOTH EXTRACTION     Social History   Socioeconomic History  . Marital status: Single    Spouse name: Not on file  . Number of children: 2  . Years of education: 912  . Highest education level: Not on file  Occupational History  . Occupation: Academic librarianackage Handler  Social Needs  . Financial resource strain: Not on file  . Food insecurity    Worry: Not on file    Inability: Not on file  . Transportation needs    Medical: Not on file    Non-medical: Not on file  Tobacco Use  . Smoking status: Current Every Day Smoker    Packs/day: 1.00    Years: 20.00    Pack years: 20.00    Types: Cigarettes  . Smokeless tobacco: Never Used  Substance and Sexual Activity  . Alcohol use: No    Frequency: Never    Comment: occasionally  . Drug use: Yes    Frequency: 1.0 times per week    Types: Marijuana    Comment: Rarely  . Sexual activity: Yes    Partners: Male    Birth control/protection: I.U.D.  Lifestyle  . Physical activity    Days per week: Not on file    Minutes per session: Not on file  . Stress: Not on file  Relationships  . Social Musicianconnections    Talks on phone: Not on file    Gets together: Not on file   Attends religious service: Not on file    Active member of club or organization: Not on file    Attends meetings of clubs or organizations: Not on file    Relationship status: Not on file  Other Topics Concern  . Not on file  Social History Narrative  . Not on file   Allergies  Allergen Reactions  . Pennsaid [Diclofenac Sodium]     Local rash   Family History  Problem Relation Age of Onset  . Cancer Father   . Cancer Maternal Grandmother   . Breast cancer Maternal Grandmother   . Cancer Paternal Uncle     Current Outpatient Medications (Endocrine & Metabolic):  .  levonorgestrel (MIRENA) 20 MCG/24HR IUD, 1 each by Intrauterine route once.      Current Outpatient Medications (Other):  .  cephALEXin (KEFLEX) 500 MG capsule, Take 1 capsule (500 mg total) by mouth 2 (two) times daily. .  methocarbamol (ROBAXIN) 500 MG tablet, Take 1 tablet (500 mg total) by mouth every 8 (eight) hours as needed. For muscle spasm    Past medical history, social, surgical and family history all reviewed in electronic medical  record.  No pertanent information unless stated regarding to the chief complaint.   Review of Systems:  No headache, visual changes, nausea, vomiting, diarrhea, constipation, dizziness, abdominal pain, skin rash, fevers, chills, night sweats, weight loss, swollen lymph nodes, body aches, joint swelling, , chest pain, shortness of breath, mood changes.  Positive muscle aches  Objective  Blood pressure 110/78, pulse 87, height 5\' 5"  (1.651 m), weight 192 lb (87.1 kg), SpO2 97 %.    General: No apparent distress alert and oriented x3 mood and affect normal, dressed appropriately.  HEENT: Pupils equal, extraocular movements intact  Respiratory: Patient's speak in full sentences and does not appear short of breath  Cardiovascular: No lower extremity edema, non tender, no erythema  Skin: Warm dry intact with no signs of infection or rash on extremities or on axial skeleton.   Abdomen: Soft nontender  Neuro: Cranial nerves II through XII are intact, neurovascularly intact in all extremities with 2+ DTRs and 2+ pulses.  Lymph: No lymphadenopathy of posterior or anterior cervical chain or axillae bilaterally.  Gait normal with good balance and coordination.  MSK:  Non tender with full range of motion and good stability and symmetric strength and tone of elbows, wrist, hip, knee and ankles bilaterally.  Shoulder: left Inspection reveals no abnormalities, atrophy or asymmetry. Palpation is normal with no tenderness over AC joint or bicipital groove. ROM is full in all planes. Rotator cuff strength normal throughout. No signs of impingement with negative Neer and Hawkin's tests, empty can sign. Speeds and Yergason's tests normal. No labral pathology noted with negative Obrien's, negative clunk and good stability. Normal scapular function observed. No painful arc and no drop arm sign. No apprehension sign Contralateral shoulder unremarkable  Neck: Inspection loss of lordosis. No palpable stepoffs. Negative Spurling's maneuver. Full neck range of motion Grip strength and sensation normal in bilateral hands Strength good C4 to T1 distribution No sensory change to C4 to T1 Negative Hoffman sign bilaterally Reflexes normal Tightness of the trapezius noted left greater than right.  Osteopathic findings  C4 flexed rotated and side bent left C6 flexed rotated and side bent left T3 extended rotated and side bent right inhaled third rib T6 extended rotated and side bent left L2 flexed rotated and side bent right Sacrum right on right        Impression and Recommendations:     This case required medical decision making of moderate complexity. The above documentation has been reviewed and is accurate and complete Lyndal Pulley, DO       Note: This dictation was prepared with Dragon dictation along with smaller phrase technology. Any transcriptional  errors that result from this process are unintentional.

## 2019-04-10 ENCOUNTER — Ambulatory Visit (INDEPENDENT_AMBULATORY_CARE_PROVIDER_SITE_OTHER): Payer: BC Managed Care – PPO | Admitting: Family Medicine

## 2019-04-10 ENCOUNTER — Other Ambulatory Visit: Payer: Self-pay

## 2019-04-10 ENCOUNTER — Encounter: Payer: Self-pay | Admitting: Family Medicine

## 2019-04-10 VITALS — BP 110/78 | HR 87 | Ht 65.0 in | Wt 192.0 lb

## 2019-04-10 DIAGNOSIS — M940 Chondrocostal junction syndrome [Tietze]: Secondary | ICD-10-CM | POA: Diagnosis not present

## 2019-04-10 DIAGNOSIS — G8929 Other chronic pain: Secondary | ICD-10-CM

## 2019-04-10 DIAGNOSIS — M999 Biomechanical lesion, unspecified: Secondary | ICD-10-CM

## 2019-04-10 DIAGNOSIS — M25512 Pain in left shoulder: Secondary | ICD-10-CM | POA: Diagnosis not present

## 2019-04-10 NOTE — Assessment & Plan Note (Signed)
Decision today to treat with OMT was based on Physical Exam  After verbal consent patient was treated with HVLA, ME, FPR techniques in cervical, rib thoracic, lumbar and sacral areas  Patient tolerated the procedure well with improvement in symptoms  Patient given exercises, stretches and lifestyle modifications  See medications in patient instructions if given  Patient will follow up in 4-8 weeks 

## 2019-04-10 NOTE — Assessment & Plan Note (Signed)
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  Discussed posture and ergonomics.  Discussed which activities to do which wants to avoid.  Patient given some scapular dyskinesis exercises but I think will be beneficial.  Discussed icing regimen.  Follow-up again in 4 to 8 weeks

## 2019-04-10 NOTE — Patient Instructions (Signed)
Win the matches! Exercises 3x a week See me again in 5-6 weeks

## 2019-05-21 NOTE — Progress Notes (Signed)
[facility] [place], [place] [address] Phone: [phone] Subjective:   Laura Guerra, am serving as a scribe for Dr. Hulan Saas.   CC: Back pain and neck pain follow-up  [id number]  Laura Guerra is a 33 y.o. female coming in with complaint of neck pain and back pain.  Patient states it is still there.  States that the manipulation helps for couple days and then seems to get worse again.  Looking for something to help her with some mechanical regular basis.  Patient denies any pain,     Past Medical History:  Diagnosis Date  . Guerra pertinent past medical history   . Seasonal depression ([place])    Past Surgical History:  Procedure Laterality Date  . Guerra PAST SURGERIES    . WISDOM TOOTH EXTRACTION     Social History   Socioeconomic History  . Marital status: Single    Spouse name: Not on file  . Number of children: 2  . Years of education: 58  . Highest education level: Not on file  Occupational History  . Occupation: Designer, jewellery  Social Needs  . Financial resource strain: Not on file  . Food insecurity    Worry: Not on file    Inability: Not on file  . Transportation needs    Medical: Not on file    Non-medical: Not on file  Tobacco Use  . Smoking status: Current Every Day Smoker    Packs/day: 1.00    Years: 20.00    Pack years: 20.00    Types: Cigarettes  . Smokeless tobacco: Never Used  Substance and Sexual Activity  . Alcohol use: Guerra    Frequency: Never    Comment: occasionally  . Drug use: Yes    Frequency: 1.0 times per week    Types: Marijuana    Comment: Rarely  . Sexual activity: Yes    Partners: Male    Birth control/protection: I.U.D.  Lifestyle  . Physical activity    Days per week: Not on file    Minutes per session: Not on file  . Stress: Not on file  Relationships  . Social Herbalist on phone: Not on file    Gets together: Not on file    Attends religious service:  Not on file    Active member of club or organization: Not on file    Attends meetings of clubs or organizations: Not on file    Relationship status: Not on file  Other Topics Concern  . Not on file  Social History Narrative  . Not on file   Allergies  Allergen Reactions  . Pennsaid [Diclofenac Sodium]     Local rash   Family History  Problem Relation Age of Onset  . Cancer Father   . Cancer Maternal Grandmother   . Breast cancer Maternal Grandmother   . Cancer Paternal Uncle     Current Outpatient Medications (Endocrine & Metabolic):  .  levonorgestrel (MIRENA) 20 MCG/24HR IUD, 1 each by Intrauterine route once.    Current Outpatient Medications (Analgesics):  .  meloxicam (MOBIC) 7.5 MG tablet, Take 1 tablet (7.5 mg total) by mouth daily.   Current Outpatient Medications (Other):  .  cephALEXin (KEFLEX) 500 MG capsule, Take 1 capsule (500 mg total) by mouth 2 (two) times daily. .  methocarbamol (ROBAXIN) 500 MG tablet, Take 1 tablet (500 mg total) by mouth every 8 (eight) hours as needed. For  muscle spasm .  tiZANidine (ZANAFLEX) 4 MG capsule, 1 tablet at night    Past medical history, social, surgical and family history all reviewed in electronic medical record.  Guerra pertanent information unless stated regarding to the chief complaint.   Review of Systems:  Guerra headache, visual changes, nausea, vomiting, diarrhea, constipation, dizziness, abdominal pain, skin rash, fevers, chills, night sweats, weight loss, swollen lymph nodes, body aches, joint swelling,  chest pain, shortness of breath, mood changes.  Positive muscle aches  Objective  Blood pressure 124/84, pulse 80, height 5\' 5"  (1.651 Guerra), weight 195 lb (88.5 kg), SpO2 97 %.    General: Guerra apparent distress alert and oriented x3 mood and affect normal, dressed appropriately.  HEENT: Pupils equal, extraocular movements intact  Respiratory: Patient's speak in full sentences and does not appear short of breath   Cardiovascular: Guerra lower extremity edema, non tender, Guerra erythema  Skin: Warm dry intact with Guerra signs of infection or rash on extremities or on axial skeleton.  Abdomen: Soft nontender  Neuro: Cranial nerves II through XII are intact, neurovascularly intact in all extremities with 2+ DTRs and 2+ pulses.  Lymph: Guerra lymphadenopathy of posterior or anterior cervical chain or axillae bilaterally.  Gait normal with good balance and coordination.  MSK:  Non tender with full range of motion and good stability and symmetric strength and tone of shoulders, elbows, wrist, hip, knee and ankles bilaterally.  Neck: Inspection mild loss of lordosis. Guerra palpable stepoffs. Negative Spurling's maneuver. Full neck range of motion Grip strength and sensation normal in bilateral hands Strength good C4 to T1 distribution Guerra sensory change to C4 to T1 Negative Hoffman sign bilaterally Reflexes normal Tightness in the trapezius  Back Exam:  Inspection: Loss of lordosis Motion: Flexion 45 deg, Extension 25 deg, Side Bending to 35 deg bilaterally,  Rotation to 35 deg bilaterally  SLR laying: Negative  XSLR laying: Negative  Palpable tenderness: Tender palpation in paraspinal musculature lumbar spine right and left. FABER: Positive bilaterally. Sensory change: Gross sensation intact to all lumbar and sacral dermatomes.  Reflexes: 2+ at both patellar tendons, 2+ at achilles tendons, Babinski's downgoing.  Strength at foot  Plantar-flexion: 5/5 Dorsi-flexion: 5/5 Eversion: 5/5 Inversion: 5/5  Leg strength  Quad: 5/5 Hamstring: 5/5 Hip flexor: 5/5 Hip abductors: 5/5  Gait unremarkable.  Osteopathic findings  C4 flexed rotated and side bent left C7 flexed rotated and side bent left T3 extended rotated and side bent right inhaled third rib T8 extended rotated and side bent left L2 flexed rotated and side bent right Sacrum right on right    Impression and Recommendations:     This case required  medical decision making of moderate complexity. The above documentation has been reviewed and is accurate and complete Laura Guerra , DO       Note: This dictation was prepared with Dragon dictation along with smaller phrase technology. Any transcriptional errors that result from this process are unintentional.

## 2019-05-22 ENCOUNTER — Encounter: Payer: Self-pay | Admitting: Family Medicine

## 2019-05-22 ENCOUNTER — Other Ambulatory Visit: Payer: Self-pay

## 2019-05-22 ENCOUNTER — Ambulatory Visit (INDEPENDENT_AMBULATORY_CARE_PROVIDER_SITE_OTHER): Payer: BC Managed Care – PPO | Admitting: Family Medicine

## 2019-05-22 VITALS — BP 124/84 | HR 80 | Ht 65.0 in | Wt 195.0 lb

## 2019-05-22 DIAGNOSIS — M940 Chondrocostal junction syndrome [Tietze]: Secondary | ICD-10-CM

## 2019-05-22 DIAGNOSIS — M999 Biomechanical lesion, unspecified: Secondary | ICD-10-CM

## 2019-05-22 MED ORDER — MELOXICAM 7.5 MG PO TABS: 7.5000 mg | ORAL_TABLET | Freq: Every day | ORAL | 0 refills | Status: DC

## 2019-05-22 MED ORDER — TIZANIDINE HCL 4 MG PO CAPS: ORAL_CAPSULE | ORAL | 0 refills | Status: DC

## 2019-05-22 NOTE — Assessment & Plan Note (Signed)
Discussed with patient in great length.  Patient needs to continue to work on posture and ergonomics.  Patient is having difficulty doing the exercises on a regular basis.  We discussed icing regimen, home exercise, which activities to do which wants to avoid.  Patient will increase activity follow-up in 4 to 6 weeks

## 2019-05-22 NOTE — Patient Instructions (Signed)
Good to see you Medications sent in See me again in 4-6 weeks

## 2019-05-22 NOTE — Assessment & Plan Note (Signed)
Decision today to treat with OMT was based on Physical Exam  After verbal consent patient was treated with HVLA, ME, FPR techniques in cervical, thoracic, rib, lumbar and sacral areas  Patient tolerated the procedure well with improvement in symptoms  Patient given exercises, stretches and lifestyle modifications  See medications in patient instructions if given  Patient will follow up in 4-6 weeks 

## 2019-06-13 ENCOUNTER — Other Ambulatory Visit: Payer: Self-pay | Admitting: Family Medicine

## 2019-06-14 ENCOUNTER — Other Ambulatory Visit: Payer: Self-pay | Admitting: Family Medicine

## 2019-07-02 NOTE — Assessment & Plan Note (Deleted)
Decision today to treat with OMT was based on Physical Exam  After verbal consent patient was treated with HVLA, ME, FPR techniques in cervical, thoracic, rib lumbar and sacral areas  Patient tolerated the procedure well with improvement in symptoms  Patient given exercises, stretches and lifestyle modifications  See medications in patient instructions if given  Patient will follow up in 4-8 weeks 

## 2019-07-02 NOTE — Progress Notes (Deleted)
[facility] [address] [place], [place] [address] Phone: [phone] Subjective:    I'm seeing this patient by the request  of:    CC: Leg pain follow-up  [id number]  Laura Guerra is a 33 y.o. female coming in with complaint of ***  Onset-  Location Duration-  Character- Aggravating factors- Reliving factors-  Therapies tried-  Severity-     Past Medical History:  Diagnosis Date  . No pertinent past medical history   . Seasonal depression ([place])    Past Surgical History:  Procedure Laterality Date  . NO PAST SURGERIES    . WISDOM TOOTH EXTRACTION     Social History   Socioeconomic History  . Marital status: Single    Spouse name: Not on file  . Number of children: 2  . Years of education: 29  . Highest education level: Not on file  Occupational History  . Occupation: Designer, jewellery  Social Needs  . Financial resource strain: Not on file  . Food insecurity    Worry: Not on file    Inability: Not on file  . Transportation needs    Medical: Not on file    Non-medical: Not on file  Tobacco Use  . Smoking status: Current Every Day Smoker    Packs/day: 1.00    Years: 20.00    Pack years: 20.00    Types: Cigarettes  . Smokeless tobacco: Never Used  Substance and Sexual Activity  . Alcohol use: No    Frequency: Never    Comment: occasionally  . Drug use: Yes    Frequency: 1.0 times per week    Types: Marijuana    Comment: Rarely  . Sexual activity: Yes    Partners: Male    Birth control/protection: I.U.D.  Lifestyle  . Physical activity    Days per week: Not on file    Minutes per session: Not on file  . Stress: Not on file  Relationships  . Social Herbalist on phone: Not on file    Gets together: Not on file    Attends religious service: Not on file    Active member of club or organization: Not on file    Attends meetings of clubs or organizations: Not on file    Relationship status: Not  on file  Other Topics Concern  . Not on file  Social History Narrative  . Not on file   Allergies  Allergen Reactions  . Pennsaid [Diclofenac Sodium]     Local rash   Family History  Problem Relation Age of Onset  . Cancer Father   . Cancer Maternal Grandmother   . Breast cancer Maternal Grandmother   . Cancer Paternal Uncle     Current Outpatient Medications (Endocrine & Metabolic):  .  levonorgestrel (MIRENA) 20 MCG/24HR IUD, 1 each by Intrauterine route once.    Current Outpatient Medications (Analgesics):  .  meloxicam (MOBIC) 7.5 MG tablet, TAKE 1 TABLET BY MOUTH EVERY DAY   Current Outpatient Medications (Other):  .  cephALEXin (KEFLEX) 500 MG capsule, Take 1 capsule (500 mg total) by mouth 2 (two) times daily. .  methocarbamol (ROBAXIN) 500 MG tablet, Take 1 tablet (500 mg total) by mouth every 8 (eight) hours as needed. For muscle spasm .  tiZANidine (ZANAFLEX) 4 MG capsule, TAKE 1 CAPSULE EVERY NIGHT    Past medical history, social, surgical and family history all reviewed in electronic medical record.  No pertanent  information unless stated regarding to the chief complaint.   Review of Systems:  No headache, visual changes, nausea, vomiting, diarrhea, constipation, dizziness, abdominal pain, skin rash, fevers, chills, night sweats, weight loss, swollen lymph nodes, body aches, joint swelling,  chest pain, shortness of breath, mood changes.  Positive muscle aches  Objective  There were no vitals taken for this visit.    General: No apparent distress alert and oriented x3 mood and affect normal, dressed appropriately.  HEENT: Pupils equal, extraocular movements intact  Respiratory: Patient's speak in full sentences and does not appear short of breath  Cardiovascular: No lower extremity edema, non tender, no erythema  Skin: Warm dry intact with no signs of infection or rash on extremities or on axial skeleton.  Abdomen: Soft nontender  Neuro: Cranial nerves II  through XII are intact, neurovascularly intact in all extremities with 2+ DTRs and 2+ pulses.  Lymph: No lymphadenopathy of posterior or anterior cervical chain or axillae bilaterally.  Gait normal with good balance and coordination.  MSK:  Non tender with full range of motion and good stability and symmetric strength and tone of shoulders, elbows, wrist, hip, knee and ankles bilaterally.  Back Exam:  Inspection: Unremarkable  Motion: Flexion 45 deg, Extension 45 deg, Side Bending to 45 deg bilaterally,  Rotation to 45 deg bilaterally  SLR laying: Negative  XSLR laying: Negative  Palpable tenderness: None. FABER: negative. Sensory change: Gross sensation intact to all lumbar and sacral dermatomes.  Reflexes: 2+ at both patellar tendons, 2+ at achilles tendons, Babinski's downgoing.  Strength at foot  Plantar-flexion: 5/5 Dorsi-flexion: 5/5 Eversion: 5/5 Inversion: 5/5  Leg strength  Quad: 5/5 Hamstring: 5/5 Hip flexor: 5/5 Hip abductors: 5/5  Gait unremarkable.  Osteopathic findings  C2 flexed rotated and side bent right C4 flexed rotated and side bent left C6 flexed rotated and side bent left T3 extended rotated and side bent right inhaled third rib T9 extended rotated and side bent left L2 flexed rotated and side bent right Sacrum right on right    Impression and Recommendations:     This case required medical decision making of moderate complexity. The above documentation has been reviewed and is accurate and complete Judi Saa, DO       Note: This dictation was prepared with Dragon dictation along with smaller phrase technology. Any transcriptional errors that result from this process are unintentional.

## 2019-07-02 NOTE — Assessment & Plan Note (Deleted)
Continues to have some difficulty.  Discussed posture and ergonomics, discussed which activities to do.  Patient is making some mild progress.  Discussed different medications.  Follow-up again in 4 to 8 weeks

## 2019-07-03 ENCOUNTER — Ambulatory Visit: Payer: BC Managed Care – PPO | Admitting: Family Medicine

## 2019-07-03 DIAGNOSIS — Z0289 Encounter for other administrative examinations: Secondary | ICD-10-CM

## 2019-08-03 ENCOUNTER — Ambulatory Visit (INDEPENDENT_AMBULATORY_CARE_PROVIDER_SITE_OTHER): Payer: BC Managed Care – PPO | Admitting: Internal Medicine

## 2019-08-03 ENCOUNTER — Encounter: Payer: Self-pay | Admitting: Internal Medicine

## 2019-08-03 DIAGNOSIS — J0110 Acute frontal sinusitis, unspecified: Secondary | ICD-10-CM

## 2019-08-03 DIAGNOSIS — J329 Chronic sinusitis, unspecified: Secondary | ICD-10-CM | POA: Insufficient documentation

## 2019-08-03 MED ORDER — FLUCONAZOLE 150 MG PO TABS: 150.0000 mg | ORAL_TABLET | ORAL | 0 refills | Status: DC

## 2019-08-03 MED ORDER — AMOXICILLIN-POT CLAVULANATE 875-125 MG PO TABS: 1.0000 | ORAL_TABLET | Freq: Two times a day (BID) | ORAL | 0 refills | Status: DC

## 2019-08-03 NOTE — Assessment & Plan Note (Signed)
Rx augmentin and diflucan. Advised to get tested for covid-19 and she is not interested at this time. If symptoms not significantly improved Monday needs to get testing and she is in agreement. Stay home until well.

## 2019-08-03 NOTE — Progress Notes (Signed)
Virtual Visit via Video Note  I connected with Laura Guerra on 08/03/19 at  3:40 PM EST by a video enabled telemedicine application and verified that I am speaking with the correct person using two identifiers.  The patient and the provider were at separate locations throughout the entire encounter.   I discussed the limitations of evaluation and management by telemedicine and the availability of in person appointments. The patient expressed understanding and agreed to proceed. The patient and the provider were the only parties present for the visit unless noted in HPI below.  History of Present Illness: The patient is a 33 y.o. female with visit for sinus problems. Started about 1 week ago. Took claritin for it last week and felt it was getting better. Then got worse again 4 days ago. Denies fevers but some chills. Denies SOB but cough. Denies productive cough. Does have headaches. Taking claritin and not helping much. Overall it is worsening.   Observations/Objective: Appearance: normal, breathing appears normal, minimal coughing during visit, casual grooming, abdomen does not appear distended, throat not visualized well, memory normal, mental status is A and O times 3  Assessment and Plan: See problem oriented charting  Follow Up Instructions: offered covid-19 testing which she declines, rx augmentin and diflucan, if no improvement by Monday needs covid-19 testing and out of work until then at least  I discussed the assessment and treatment plan with the patient. The patient was provided an opportunity to ask questions and all were answered. The patient agreed with the plan and demonstrated an understanding of the instructions.   The patient was advised to call back or seek an in-person evaluation if the symptoms worsen or if the condition fails to improve as anticipated.  Hoyt Koch, MD

## 2019-10-23 ENCOUNTER — Ambulatory Visit (INDEPENDENT_AMBULATORY_CARE_PROVIDER_SITE_OTHER): Payer: BC Managed Care – PPO | Admitting: Family

## 2019-10-23 ENCOUNTER — Encounter: Payer: Self-pay | Admitting: Family

## 2019-10-23 DIAGNOSIS — H5711 Ocular pain, right eye: Secondary | ICD-10-CM

## 2019-10-23 MED ORDER — DOXYCYCLINE HYCLATE 100 MG PO TABS: 100.0000 mg | ORAL_TABLET | Freq: Two times a day (BID) | ORAL | 0 refills | Status: DC

## 2019-10-23 MED ORDER — FLUCONAZOLE 150 MG PO TABS: ORAL_TABLET | ORAL | 0 refills | Status: DC

## 2019-10-23 NOTE — Progress Notes (Signed)
Laura Guerra is a 34 y.o. female with the following history as recorded in [employer]:  Patient Active Problem List   Diagnosis Date Noted  . Sinusitis 08/03/2019  . Nonallopathic lesion of cervical region 04/10/2019  . Nonallopathic lesion of rib cage 04/10/2019  . Thumb pain, right 05/20/2018  . Acute upper respiratory infection 12/25/2017  . Enlarged tonsils 12/25/2017  . Chest pain 12/23/2017  . 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 Radiculitis of left cervical region 06/25/2017  . 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 Back pain 05/25/2016  . Bursitis of right shoulder 02/10/2016  . Nonallopathic lesion of sacral region 10/28/2015  . Nonallopathic lesion of thoracic region 10/28/2015  . 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 Allergic rhinitis 10/08/2015  . Polycythemia 05/30/2015  . Nonallopathic lesion of lumbosacral region 04/09/2015  . Routine general medical examination at a health care facility 03/11/2015  . Tobacco use disorder 02/28/2015  . Encounter for insertion of mirena IUD 07/27/2012    Current Outpatient Medications  Medication Sig Dispense Refill  . doxycycline (VIBRA-TABS) 100 MG tablet Take 1 tablet (100 mg total) by mouth 2 (two) times daily. 20 tablet 0  . fluconazole (DIFLUCAN) 150 MG tablet Repeat 72 hours as directed 2 tablet 0  . levonorgestrel (MIRENA) 20 MCG/24HR IUD 1 each by Intrauterine route once.    . meloxicam (MOBIC) 7.5 MG tablet TAKE 1 TABLET BY MOUTH EVERY DAY 30 tablet 0  . methocarbamol (ROBAXIN) 500 MG tablet Take 1 tablet (500 mg total) by mouth every 8 (eight) hours as needed. For muscle spasm 30 tablet 0  . tiZANidine (ZANAFLEX) 4 MG capsule TAKE 1 CAPSULE EVERY NIGHT 30 capsule 0   No current facility-administered medications for this visit.    Allergies: Pennsaid [diclofenac sodium]  Past Medical History:  Diagnosis Date  . No pertinent past medical history   . Seasonal depression ([place])     Past Surgical History:  Procedure  Laterality Date  . NO PAST SURGERIES    . WISDOM TOOTH EXTRACTION      Family History  Problem Relation Age of Onset  . Cancer Father   . Cancer Maternal Grandmother   . Breast cancer Maternal Grandmother   . Cancer Paternal Uncle     Social History   Tobacco Use  . Smoking status: Current Every Day Smoker    Packs/day: 1.00    Years: 20.00    Pack years: 20.00    Types: Cigarettes  . Smokeless tobacco: Never Used  Substance Use Topics  . Alcohol use: No    Comment: occasionally    Subjective:   I connected with Deshawnda Acrey Roland on 10/23/19 at  3:00 PM EST by a video enabled telemedicine application and verified that I am speaking with the correct person using two identifiers.   I discussed the limitations of evaluation and management by telemedicine and the availability of in person appointments. The patient expressed understanding and agreed to proceed.  Provider in office/ patient is at home; provider and patient are only 2 people on video call.   Patient notes that last Thursday she developed a headache behind her right eye. She is not prone to migraines. The headache has persisted and she is complaining of light sensitivity/ redness/ pain in the right eye. She denies any vision changes. She does feel that she is very congested and is suspicious that she has a sinus infection. She has used Benadryl with some benefit and feels that the pain in her  eye has improved with use of Benadryl.     Objective:  There were no vitals filed for this visit.  General: Well developed, well nourished, in no acute distress  Skin : Warm and dry.  Head: Normocephalic and atraumatic  Eyes: Unable to clearly visualize over computer screen Lungs: Respirations unlabored; Neurologic: Alert and oriented; speech intact; face symmetrical;   Assessment:  1. Acute right eye pain     Plan:  Explained to patient that I am concerned with her presentation of eye pain and light sensitivity that  she could have something like iritis; however, due to pain improvement with use of Benadryl and patient's tendency to develop sinus infections, will treat for acute sinusitis; she plans to see her eye doctor tomorrow for follow-up if the eye pain/ light sensitivity persist. Rx for Doxycycline 100 mg bid x 10 days, Diflucan;  Follow-up to be determined.    No follow-ups on file.  No orders of the defined types were placed in this encounter.   Requested Prescriptions   Signed Prescriptions Disp Refills  . fluconazole (DIFLUCAN) 150 MG tablet 2 tablet 0    Sig: Repeat 72 hours as directed  . doxycycline (VIBRA-TABS) 100 MG tablet 20 tablet 0    Sig: Take 1 tablet (100 mg total) by mouth 2 (two) times daily.

## 2019-12-04 ENCOUNTER — Encounter: Payer: BC Managed Care – PPO | Admitting: Internal Medicine

## 2019-12-20 ENCOUNTER — Telehealth: Payer: Self-pay | Admitting: Obstetrics and Gynecology

## 2019-12-20 NOTE — Telephone Encounter (Signed)
Left message to call Saban Heinlen, RN at [facility] [phone].   

## 2019-12-20 NOTE — Telephone Encounter (Signed)
Patient is having breast leakage and her nipples itch. Last seen 08/06/17.

## 2019-12-22 NOTE — Telephone Encounter (Signed)
Patient is calling in regards to breast leakage and nipples itching.

## 2019-12-22 NOTE — Telephone Encounter (Signed)
Spoke to pt. Pt states having itching and nipple discharge for couple weeks. Pt has breast CA as family hx. Pt states really worried. Itching has been going on for a month, but starting having discharge a couple weeks ago. Pt advised to be seen. OV with Dr Edward Jolly scheduled for 12/25/2019 at 1 pm. Pt agreeable and verbalized understanding. Pt denies pain or fever, chills. CPS neg.   Routing to Dr Edward Jolly for review and will close encounter.

## 2019-12-25 ENCOUNTER — Ambulatory Visit: Payer: BC Managed Care – PPO | Admitting: Obstetrics and Gynecology

## 2019-12-25 ENCOUNTER — Other Ambulatory Visit: Payer: Self-pay

## 2019-12-25 ENCOUNTER — Encounter: Payer: Self-pay | Admitting: Obstetrics and Gynecology

## 2019-12-25 VITALS — BP 118/68 | HR 66 | Temp 97.7°F | Ht 65.0 in | Wt 210.4 lb

## 2019-12-25 DIAGNOSIS — Z30432 Encounter for removal of intrauterine contraceptive device: Secondary | ICD-10-CM | POA: Diagnosis not present

## 2019-12-25 DIAGNOSIS — L299 Pruritus, unspecified: Secondary | ICD-10-CM | POA: Diagnosis not present

## 2019-12-25 DIAGNOSIS — N6452 Nipple discharge: Secondary | ICD-10-CM | POA: Diagnosis not present

## 2019-12-25 LAB — POCT URINE PREGNANCY: Preg Test, Ur: NEGATIVE

## 2019-12-25 NOTE — Progress Notes (Signed)
GYNECOLOGY  VISIT   HPI: 34 y.o.   Single  Caucasian  female   G2P2002 with No LMP recorded. (Menstrual status: IUD).   here for bilateral nipple itching and she is having discharge from right nipple.    Itching occurring for a while.  Thought she just had eczema.  She has had itching from the left nipple as well.  When she squeezes her right breast, she has nipple discharge, white in color.  Occurring for one month.  Looks like milk. Not sure if this is occurring with left nipple.   She is having random right breast pain also.   No trauma to the breast.   New bras.   Does not feel a lump.   She has done some changes in detergents at home.   Mirena IUD placed on 08/06/17.  Wants it removed.  Thinking about a future pregnancy.   Has not had Covid vaccination.   UPT negative.   GYNECOLOGIC HISTORY: No LMP recorded. (Menstrual status: IUD). Contraception: Mirena IUD 08-06-17 Menopausal hormone therapy:  none Last mammogram: n/a Last pap smear: 04-17-16 ASCUS :Neg HR HPV, 02-04-12 Neg        OB History    Gravida  2   Para  2   Term  2   Preterm  0   AB  0   Living  2     SAB  0   TAB  0   Ectopic  0   Multiple  0   Live Births  2              Patient Active Problem List   Diagnosis Date Noted  . Sinusitis 08/03/2019  . Nonallopathic lesion of cervical region 04/10/2019  . Nonallopathic lesion of rib cage 04/10/2019  . Thumb pain, right 05/20/2018  . Acute upper respiratory infection 12/25/2017  . Enlarged tonsils 12/25/2017  . Chest pain 12/23/2017  . 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 Radiculitis of left cervical region 06/25/2017  . 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 Back pain 05/25/2016  . Bursitis of right shoulder 02/10/2016  . Nonallopathic lesion of sacral region 10/28/2015  . Nonallopathic lesion of thoracic region 10/28/2015  . 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 Allergic rhinitis 10/08/2015  . Polycythemia 05/30/2015  .  Nonallopathic lesion of lumbosacral region 04/09/2015  . Routine general medical examination at a health care facility 03/11/2015  . Tobacco use disorder 02/28/2015  . Encounter for insertion of mirena IUD 07/27/2012    Past Medical History:  Diagnosis Date  . No pertinent past medical history   . Seasonal depression ([place])     Past Surgical History:  Procedure Laterality Date  . NO PAST SURGERIES    . WISDOM TOOTH EXTRACTION      Current Outpatient Medications  Medication Sig Dispense Refill  . levonorgestrel (MIRENA) 20 MCG/24HR IUD 1 each by Intrauterine route once.    Marland Kitchen tiZANidine (ZANAFLEX) 4 MG capsule TAKE 1 CAPSULE EVERY NIGHT 30 capsule 0   No current facility-administered medications for this visit.     ALLERGIES: Pennsaid [diclofenac sodium]  Family History  Problem Relation Age of Onset  . Cancer Father   . Cancer Maternal Grandmother   . Breast cancer Maternal Grandmother   . Cancer Paternal Uncle     Social History   Socioeconomic History  . Marital status: Single    Spouse name: Not on file  . Number of children: 2  . Years of education: 29  .  Highest education level: Not on file  Occupational History  . Occupation: Academic librarian  Tobacco Use  . Smoking status: Current Every Day Smoker    Packs/day: 1.00    Years: 20.00    Pack years: 20.00    Types: Cigarettes  . Smokeless tobacco: Never Used  Substance and Sexual Activity  . Alcohol use: No    Comment: occasionally  . Drug use: Yes    Frequency: 1.0 times per week    Types: Marijuana    Comment: Rarely  . Sexual activity: Yes    Partners: Male    Birth control/protection: I.U.D.  Other Topics Concern  . Not on file  Social History Narrative  . Not on file   Social Determinants of Health   Financial Resource Strain:   . Difficulty of Paying Living Expenses:   Food Insecurity:   . Worried About Programme researcher, broadcasting/film/video in the Last Year:   . Barista in the Last Year:    Transportation Needs:   . Freight forwarder (Medical):   Marland Kitchen Lack of Transportation (Non-Medical):   Physical Activity:   . Days of Exercise per Week:   . Minutes of Exercise per Session:   Stress:   . Feeling of Stress :   Social Connections:   . Frequency of Communication with Friends and Family:   . Frequency of Social Gatherings with Friends and Family:   . Attends Religious Services:   . Active Member of Clubs or Organizations:   . Attends Banker Meetings:   Marland Kitchen Marital Status:   Intimate Partner Violence:   . Fear of Current or Ex-Partner:   . Emotionally Abused:   Marland Kitchen Physically Abused:   . Sexually Abused:     Review of Systems  All other systems reviewed and are negative.   PHYSICAL EXAMINATION:    BP 118/68 (Cuff Size: Large)   Pulse 66   Temp 97.7 F (36.5 C) (Temporal)   Ht 5\' 5"  (1.651 m)   Wt 210 lb 6.4 oz (95.4 kg)   BMI 35.01 kg/m     General appearance: alert, cooperative and appears stated age   Breasts: normal appearance, no masses or tenderness, No nipple retraction or dimpling, No nipple discharge or bleeding, No axillary or supraclavicular adenopathy   Chaperone was present for exam.  ASSESSMENT  Right breast itching and nipple discharge.   Normal breast exam.  FH MGM with breast cancer.  Mirena IUD.   PLAN  Check TSH and prolactin.  UPT now.  Hydrocortisone cream to breast prn.  Avoid irritants such as new cleaning products.  New bra also contributing to this? Hydrating lotions discussed.  If nipple discharge persists, will do mammogram and possible breast .  Return for annual exam and IUD removal.   Will precert IUD removal.   An After Visit Summary was printed and given to the patient.  __20____ minutes face to face time of which over 50% was spent in counseling.

## 2019-12-26 LAB — PROLACTIN: Prolactin: 9.6 ng/mL (ref 4.8–23.3)

## 2019-12-26 LAB — TSH: TSH: 1.49 u[IU]/mL (ref 0.450–4.500)

## 2019-12-27 ENCOUNTER — Telehealth: Payer: Self-pay | Admitting: Obstetrics and Gynecology

## 2019-12-27 NOTE — Telephone Encounter (Signed)
   1. Call placed to convey benefits for iud removal. Spoke with the patient and conveyed the benefits. Patient understands/agreeable with the benefits. Patient is aware of the cancellation policy. Appointment scheduled 01/29/20.  2. Patient wants to now if her test results are in yet.

## 2019-12-27 NOTE — Telephone Encounter (Signed)
Spoke to pt. Pt wanting to know results from 12/25/2019. Pt informed after Dr Edward Jolly reviews, will return call to pt. Pt agreeable.   Routing to Dr Edward Jolly for review and test results.

## 2019-12-28 NOTE — Telephone Encounter (Signed)
Left detailed message per DPR on lab results. Pt to return call to office if any questions or concerns.  Routing to Dr Edward Jolly for review and will close encounter.

## 2019-12-28 NOTE — Telephone Encounter (Signed)
Please let patient know that her thyroid function and prolactin levels are normal.

## 2020-01-03 ENCOUNTER — Encounter: Payer: Self-pay | Admitting: Internal Medicine

## 2020-01-09 ENCOUNTER — Encounter: Payer: Self-pay | Admitting: Internal Medicine

## 2020-01-09 ENCOUNTER — Ambulatory Visit (INDEPENDENT_AMBULATORY_CARE_PROVIDER_SITE_OTHER): Payer: BC Managed Care – PPO | Admitting: Internal Medicine

## 2020-01-09 DIAGNOSIS — B359 Dermatophytosis, unspecified: Secondary | ICD-10-CM | POA: Diagnosis not present

## 2020-01-09 DIAGNOSIS — R3989 Other symptoms and signs involving the genitourinary system: Secondary | ICD-10-CM | POA: Diagnosis not present

## 2020-01-09 NOTE — Progress Notes (Signed)
Virtual Visit via Video Note  I connected with Laura Guerra on 01/09/20 at  2:45 PM EDT by a video enabled telemedicine application and verified that I am speaking with the correct person using two identifiers.   I discussed the limitations of evaluation and management by telemedicine and the availability of in person appointments. The patient expressed understanding and agreed to proceed.  Present for the visit:  Myself, Dr Cheryll Cockayne, Ermalinda Memos.  The patient is currently at home and I am in the office.    No referring provider.    History of Present Illness: This is an acute visit for ringworm  She has had ringworm in the past, but was years ago and she does recall what it looks like and felt like.  She thinks she has it again.  She has a currently on both hands and both feet.  She has had a lesion on her left wrist for a while and wonders if she spread to the other areas.  There are circular lesions that are darker on the outside.  They are very itchy.  She did put Lotrimin on the one lesion and a does seem to have helped a little, but it is not going away.  She admits that she has not been consistent with putting the cream on.  She just noticed the areas on her feet today so those are new.  She denies any other skin issues.  She was recently at [employer] and was feeding the animals and wondered if that was a potential cause.    She is also noted some pressure on urination.  She states she is fine until the very end when she experiences pressure.  She denies any burning or discoloration.  She does not think that she had the symptoms today.      Social History   Socioeconomic History  . Marital status: Single    Spouse name: Not on file  . Number of children: 2  . Years of education: 87  . Highest education level: Not on file  Occupational History  . Occupation: Academic librarian  Tobacco Use  . Smoking status: Current Every Day Smoker    Packs/day: 1.00   Years: 20.00    Pack years: 20.00    Types: Cigarettes  . Smokeless tobacco: Never Used  Substance and Sexual Activity  . Alcohol use: No    Comment: occasionally  . Drug use: Yes    Frequency: 1.0 times per week    Types: Marijuana    Comment: Rarely  . Sexual activity: Yes    Partners: Male    Birth control/protection: I.U.D.  Other Topics Concern  . Not on file  Social History Narrative  . Not on file   Social Determinants of Health   Financial Resource Strain:   . Difficulty of Paying Living Expenses:   Food Insecurity:   . Worried About Programme researcher, broadcasting/film/video in the Last Year:   . Barista in the Last Year:   Transportation Needs:   . Freight forwarder (Medical):   Marland Kitchen Lack of Transportation (Non-Medical):   Physical Activity:   . Days of Exercise per Week:   . Minutes of Exercise per Session:   Stress:   . Feeling of Stress :   Social Connections:   . Frequency of Communication with Friends and Family:   . Frequency of Social Gatherings with Friends and Family:   . Attends Religious Services:   .  Active Member of Clubs or Organizations:   . Attends Archivist Meetings:   Marland Kitchen Marital Status:      Observations/Objective: Appears well in NAD Skin appears warm and dry.  She did show me a couple of the lesions and you could tell that they were somewhat circular with a darker border-unable to see any detail due to the poor quality of the video.  Assessment and Plan:  Ringworm: Lesions on skin likely ringworm She has been using clotrimazole and wanted something stronger-either a cream or a pill Discussed that topical ointment is the recommended treatment and if the clotrimazole should work, but she needs to use it consistently-twice a day for 2-4 weeks She will continue the clotrimazole and let us know if this does not take care of the lesions  Pressure with urination: She has been able to urinate without difficulty, but the at the end of  urination she does have some pressure No dysuria, change in urine appearance or blood in urine She does not want to come and provide a sample She will try over-the-counter remedies-cranberry juice, Azo, but again does not have any symptoms today Advised to follow-up if her symptoms continue  See Problem List for Assessment and Plan of chronic medical problems.   Follow Up Instructions:    I discussed the assessment and treatment plan with the patient. The patient was provided an opportunity to ask questions and all were answered. The patient agreed with the plan and demonstrated an understanding of the instructions.   The patient was advised to call back or seek an in-person evaluation if the symptoms worsen or if the condition fails to improve as anticipated.    Binnie Rail, MD

## 2020-01-22 ENCOUNTER — Other Ambulatory Visit: Payer: Self-pay

## 2020-01-22 ENCOUNTER — Encounter: Payer: Self-pay | Admitting: Internal Medicine

## 2020-01-22 ENCOUNTER — Ambulatory Visit: Payer: BC Managed Care – PPO | Admitting: Internal Medicine

## 2020-01-22 VITALS — BP 110/70 | HR 88 | Temp 98.4°F | Resp 16 | Ht 65.0 in | Wt 213.0 lb

## 2020-01-22 DIAGNOSIS — Z0000 Encounter for general adult medical examination without abnormal findings: Secondary | ICD-10-CM | POA: Diagnosis not present

## 2020-01-22 DIAGNOSIS — L509 Urticaria, unspecified: Secondary | ICD-10-CM | POA: Diagnosis not present

## 2020-01-22 DIAGNOSIS — R300 Dysuria: Secondary | ICD-10-CM

## 2020-01-22 DIAGNOSIS — M999 Biomechanical lesion, unspecified: Secondary | ICD-10-CM | POA: Diagnosis not present

## 2020-01-22 DIAGNOSIS — B354 Tinea corporis: Secondary | ICD-10-CM | POA: Diagnosis not present

## 2020-01-22 DIAGNOSIS — B9620 Unspecified Escherichia coli [E. coli] as the cause of diseases classified elsewhere: Secondary | ICD-10-CM

## 2020-01-22 DIAGNOSIS — T783XXA Angioneurotic edema, initial encounter: Secondary | ICD-10-CM

## 2020-01-22 DIAGNOSIS — N390 Urinary tract infection, site not specified: Secondary | ICD-10-CM

## 2020-01-22 LAB — COMPREHENSIVE METABOLIC PANEL
ALT: 13 U/L (ref 0–35)
AST: 15 U/L (ref 0–37)
Albumin: 4.6 g/dL (ref 3.5–5.2)
Alkaline Phosphatase: 62 U/L (ref 39–117)
BUN: 11 mg/dL (ref 6–23)
CO2: 31 mEq/L (ref 19–32)
Calcium: 9.6 mg/dL (ref 8.4–10.5)
Chloride: 103 mEq/L (ref 96–112)
Creatinine, Ser: 0.84 mg/dL (ref 0.40–1.20)
GFR: 77.65 mL/min (ref >60.00)
Glucose, Bld: 102 mg/dL — ABNORMAL HIGH (ref 70–99)
Potassium: 4.2 mEq/L (ref 3.5–5.1)
Sodium: 140 mEq/L (ref 135–145)
Total Bilirubin: 0.5 mg/dL (ref 0.2–1.2)
Total Protein: 7.1 g/dL (ref 6.0–8.3)

## 2020-01-22 LAB — CBC WITH DIFFERENTIAL/PLATELET
Basophils Absolute: 0 10*3/uL (ref 0.0–0.1)
Basophils Relative: 0.4 % (ref 0.0–3.0)
Eosinophils Absolute: 0.2 10*3/uL (ref 0.0–0.7)
Eosinophils Relative: 1.9 % (ref 0.0–5.0)
HCT: 45.4 % (ref 36.0–46.0)
Hemoglobin: 15.6 g/dL — ABNORMAL HIGH (ref 12.0–15.0)
Lymphocytes Relative: 24 % (ref 12.0–46.0)
Lymphs Abs: 2.4 10*3/uL (ref 0.7–4.0)
MCHC: 34.4 g/dL (ref 30.0–36.0)
MCV: 89.6 fl (ref 78.0–100.0)
Monocytes Absolute: 0.7 10*3/uL (ref 0.1–1.0)
Monocytes Relative: 7 % (ref 3.0–12.0)
Neutro Abs: 6.6 10*3/uL (ref 1.4–7.7)
Neutrophils Relative %: 66.7 % (ref 43.0–77.0)
Platelets: 257 10*3/uL (ref 150.0–400.0)
RBC: 5.07 Mil/uL (ref 3.87–5.11)
RDW: 13.1 % (ref 11.5–15.5)
WBC: 9.9 10*3/uL (ref 4.0–10.5)

## 2020-01-22 LAB — LIPID PANEL
Cholesterol: 170 mg/dL (ref 0–200)
HDL: 35.6 mg/dL — ABNORMAL LOW (ref >39.00)
LDL Cholesterol: 105 mg/dL — ABNORMAL HIGH (ref 0–99)
NonHDL: 134.25
Total CHOL/HDL Ratio: 5
Triglycerides: 144 mg/dL (ref 0.0–149.0)
VLDL: 28.8 mg/dL (ref 0.0–40.0)

## 2020-01-22 LAB — SEDIMENTATION RATE: Sed Rate: 2 mm/hr (ref 0–20)

## 2020-01-22 MED ORDER — TERBINAFINE HCL 250 MG PO TABS: 250.0000 mg | ORAL_TABLET | Freq: Every day | ORAL | 0 refills | Status: AC

## 2020-01-22 MED ORDER — TIZANIDINE HCL 4 MG PO CAPS: ORAL_CAPSULE | ORAL | 1 refills | Status: DC

## 2020-01-22 MED ORDER — MONTELUKAST SODIUM 10 MG PO TABS: 10.0000 mg | ORAL_TABLET | Freq: Every day | ORAL | 1 refills | Status: DC

## 2020-01-22 MED ORDER — DOXEPIN HCL 10 MG PO CAPS: 10.0000 mg | ORAL_CAPSULE | Freq: Every day | ORAL | 1 refills | Status: DC

## 2020-01-22 NOTE — Patient Instructions (Signed)
Hives Hives (urticaria) are itchy, red, swollen areas on the skin. Hives can appear on any part of the body. Hives often fade within 24 hours (acute hives). Sometimes, new hives appear after old ones fade and the cycle can continue for several days or weeks (chronic hives). Hives do not spread from person to person (are not contagious). Hives come from the body's reaction to something a person is allergic to (allergen), something that causes irritation, or various other triggers. When a person is exposed to a trigger, his or her body releases a chemical (histamine) that causes redness, itching, and swelling. Hives can appear right after exposure to a trigger or hours later. What are the causes? This condition may be caused by:  Allergies to foods or ingredients.  Insect bites or stings.  Exposure to pollen or pets.  Contact with latex or chemicals.  Spending time in sunlight, heat, or cold (exposure).  Exercise.  Stress.  Certain medicines. You can also get hives from other medical conditions and treatments, such as:  Viruses, including the common cold.  Bacterial infections, such as urinary tract infections and strep throat.  Certain medicines.  Allergy shots.  Blood transfusions. Sometimes, the cause of this condition is not known (idiopathic hives). What increases the risk? You are more likely to develop this condition if you:  Are a woman.  Have food allergies, especially to citrus fruits, milk, eggs, peanuts, tree nuts, or shellfish.  Are allergic to: ? Medicines. ? Latex. ? Insects. ? Animals. ? Pollen. What are the signs or symptoms? Common symptoms of this condition include raised, itchy, red or white bumps or patches on your skin. These areas may:  Become large and swollen (welts).  Change in shape and location, quickly and repeatedly.  Be separate hives or connect over a large area of skin.  Sting or become painful.  Turn white when pressed in the  center (blanch). In severe cases, yourhands, feet, and face may also become swollen. This may occur if hives develop deeper in your skin. How is this diagnosed? This condition may be diagnosed by your symptoms, medical history, and physical exam.  Your skin, urine, or blood may be tested to find out what is causing your hives and to rule out other health issues.  Your health care provider may also remove a small sample of skin from the affected area and examine it under a microscope (biopsy). How is this treated? Treatment for this condition depends on the cause and severity of your symptoms. Your health care provider may recommend using cool, wet cloths (cool compresses) or taking cool showers to relieve itching. Treatment may include:  Medicines that help: ? Relieve itching (antihistamines). ? Reduce swelling (corticosteroids). ? Treat infection (antibiotics).  An injectable medicine (omalizumab). Your health care provider may prescribe this if you have chronic idiopathic hives and you continue to have symptoms even after treatment with antihistamines. Severe cases may require an emergency injection of adrenaline (epinephrine) to prevent a life-threatening allergic reaction (anaphylaxis). Follow these instructions at home: Medicines  Take and apply over-the-counter and prescription medicines only as told by your health care provider.  If you were prescribed an antibiotic medicine, take it as told by your health care provider. Do not stop using the antibiotic even if you start to feel better. Skin care  Apply cool compresses to the affected areas.  Do not scratch or rub your skin. General instructions  Do not take hot showers or baths. This can make itching   worse.  Do not wear tight-fitting clothing.  Use sunscreen and wear protective clothing when you are outside.  Avoid any substances that cause your hives. Keep a journal to help track what causes your hives. Write  down: ? What medicines you take. ? What you eat and drink. ? What products you use on your skin.  Keep all follow-up visits as told by your health care provider. This is important. Contact a health care provider if:  Your symptoms are not controlled with medicine.  Your joints are painful or swollen. Get help right away if:  You have a fever.  You have pain in your abdomen.  Your tongue or lips are swollen.  Your eyelids are swollen.  Your chest or throat feels tight.  You have trouble breathing or swallowing. These symptoms may represent a serious problem that is an emergency. Do not wait to see if the symptoms will go away. Get medical help right away. Call your local emergency services (911 in the [place] Do not drive yourself to the hospital. Summary  Hives (urticaria) are itchy, red, swollen areas on your skin. Hives come from the body's reaction to something a person is allergic to (allergen), something that causes irritation, or various other triggers.  Treatment for this condition depends on the cause and severity of your symptoms.  Avoid any substances that cause your hives. Keep a journal to help track what causes your hives.  Take and apply over-the-counter and prescription medicines only as told by your health care provider.  Keep all follow-up visits as told by your health care provider. This is important. This information is not intended to replace advice given to you by your health care provider. Make sure you discuss any questions you have with your health care provider. Document Revised: 03/30/2018 Document Reviewed: 03/30/2018 Elsevier Patient Education  2020 [employer] Inc[employer]  

## 2020-01-22 NOTE — Progress Notes (Signed)
Subjective:  Patient ID: Laura Guerra, female    DOB: [birth year]  Age: 34 y.o. MRN: [id number]  CC: Rash and Annual Exam  This visit occurred during the SARS-CoV-2 public health emergency.  Safety protocols were in place, including screening questions prior to the visit, additional usage of staff PPE, and extensive cleaning of exam room while observing appropriate contact time as indicated for disinfecting solutions.    HPI Laura Guerra presents for a CPX.  Laura Guerra complains of a 1 month history of itchy rash.  The rash comes and goes from different areas including Laura Guerra scalp, ears, hands, lower extremities, and around Laura Guerra eyes.  Laura Guerra did a virtual visit with someone else recently and was told that this was ringworm.  Over the last month Laura Guerra has treated this with alcohol baths as well as multiple, unspecified, topical antifungal agents.  The rash comes and goes from different areas but has not completely resolved.  The only medicine Laura Guerra is taking the last month is Tylenol.  Laura Guerra smokes cigarettes but does not drink alcohol.  Laura Guerra complains of dysuria but denies abdominal pain, nausea, vomiting, hematuria, loss of appetite, fever, or chills.  Laura Guerra complains of chronic neck and back pain and wants a refill of tizanidine.  Outpatient Medications Prior to Visit  Medication Sig Dispense Refill  . levonorgestrel (MIRENA) 20 MCG/24HR IUD 1 each by Intrauterine route once.    Marland Kitchen tiZANidine (ZANAFLEX) 4 MG capsule TAKE 1 CAPSULE EVERY NIGHT 30 capsule 0   No facility-administered medications prior to visit.    ROS Review of Systems  Constitutional: Negative.  Negative for appetite change, chills, diaphoresis, fatigue and fever.  HENT: Negative.  Negative for sore throat and trouble swallowing.   Eyes: Negative.   Respiratory: Negative for cough, chest tightness, shortness of breath and wheezing.   Cardiovascular: Negative for chest pain, palpitations and leg swelling.  Gastrointestinal:  Negative for abdominal pain, diarrhea, nausea and vomiting.  Endocrine: Negative.   Genitourinary: Positive for dysuria. Negative for decreased urine volume, difficulty urinating, hematuria and urgency.  Musculoskeletal: Positive for back pain and neck pain. Negative for arthralgias and myalgias.  Skin: Positive for rash. Negative for color change.  Neurological: Negative.  Negative for dizziness, weakness, light-headedness and headaches.  Hematological: Negative for adenopathy. Does not bruise/bleed easily.  Psychiatric/Behavioral: Negative.     Objective:  BP 110/70   Pulse 88   Temp 98.4 F (36.9 C) (Oral)   Resp 16   Ht 5\' 5"  (1.651 m)   Wt 213 lb (96.6 kg)   SpO2 98%   BMI 35.45 kg/m   BP Readings from Last 3 Encounters:  01/22/20 110/70  12/25/19 118/68  05/22/19 124/84    Wt Readings from Last 3 Encounters:  01/22/20 213 lb (96.6 kg)  12/25/19 210 lb 6.4 oz (95.4 kg)  05/22/19 195 lb (88.5 kg)    Physical Exam Vitals reviewed.  Constitutional:      Appearance: Normal appearance.  HENT:     Nose: Nose normal.     Mouth/Throat:     Mouth: Mucous membranes are moist.  Eyes:     General: No scleral icterus.    Conjunctiva/sclera: Conjunctivae normal.  Cardiovascular:     Rate and Rhythm: Normal rate and regular rhythm.     Heart sounds: No murmur.  Pulmonary:     Effort: Pulmonary effort is normal.     Breath sounds: No stridor. No wheezing, rhonchi or rales.  Abdominal:  General: Abdomen is flat. Bowel sounds are normal. There is no distension.     Palpations: Abdomen is soft. There is no hepatomegaly, splenomegaly or mass.     Tenderness: There is no abdominal tenderness.  Musculoskeletal:        General: Normal range of motion.     Cervical back: Neck supple.     Right lower leg: No edema.     Left lower leg: No edema.  Lymphadenopathy:     Cervical: No cervical adenopathy.  Skin:    General: Skin is warm and dry.     Findings: Rash present.      Comments: There is no rash today.  Laura Guerra has sent pictures that were taken over the last month that show urticarial wheals and a lesion on the dorsum of the left foot that appears to be a serpiginous, roundish lesion with a raised border.  See photos.  Neurological:     General: No focal deficit present.     Mental Status: Laura Guerra is alert and oriented to person, place, and time.  Psychiatric:        Mood and Affect: Mood normal.        Behavior: Behavior normal.                Lab Results  Component Value Date   WBC 9.9 01/22/2020   HGB 15.6 (H) 01/22/2020   HCT 45.4 01/22/2020   PLT 257.0 01/22/2020   GLUCOSE 102 (H) 01/22/2020   CHOL 170 01/22/2020   TRIG 144.0 01/22/2020   HDL 35.60 (L) 01/22/2020   LDLDIRECT 83.0 03/16/2016   LDLCALC 105 (H) 01/22/2020   ALT 13 01/22/2020   AST 15 01/22/2020   NA 140 01/22/2020   K 4.2 01/22/2020   CL 103 01/22/2020   CREATININE 0.84 01/22/2020   BUN 11 01/22/2020   CO2 31 01/22/2020   TSH 1.490 12/25/2019    DG Chest 2 View  Result Date: 12/24/2017 CLINICAL DATA:  Cough and shortness of breath.  Fever. EXAM: CHEST - 2 VIEW COMPARISON:  September 11, 2017 FINDINGS: Lungs are clear. Heart size and pulmonary vascularity are normal. No adenopathy. No pneumothorax. No bone lesions. IMPRESSION: No edema or consolidation. Electronically Signed   By: Bretta Bang III M.D.   On: 12/24/2017 07:57    Assessment & Plan:   Yarielys was seen today for rash and annual exam.  Diagnoses and all orders for this visit:  Routine general medical examination at a health care facility- Exam completed, labs reviewed, vaccines reviewed and updated, Laura Guerra has a Pap smear scheduled soon with Laura Guerra gynecologist, patient education was given. -     Lipid panel; Future -     Lipid panel  Tinea corporis- Laura Guerra has no lesions to see today but there was one lesion on the dorsum of Laura Guerra left foot taken in a photo that looks like tinea.  I have therefore  recommended that Laura Guerra take a 10-day course of terbinafine. -     terbinafine (LAMISIL) 250 MG tablet; Take 1 tablet (250 mg total) by mouth daily for 10 days. -     CBC with Differential/Platelet; Future -     Comprehensive metabolic panel; Future -     Sedimentation rate; Future -     Sedimentation rate -     Comprehensive metabolic panel -     CBC with Differential/Platelet  Urticaria- I feel certain that Laura Guerra rash is consistent with urticaria that was probably triggered  by recent viral infection.  I recommended that Laura Guerra take doxepin and montelukast.  Laura Guerra called 2 days after this visit and told me Laura Guerra was not taking doxepin or montelukast and sent pictures of Laura Guerra eyelids which showed angioedema.  I encouraged Laura Guerra, again, to take the doxepin and montelukast and will also add a 6-day course of methylprednisolone. -     doxepin (SINEQUAN) 10 MG capsule; Take 1 capsule (10 mg total) by mouth at bedtime. -     montelukast (SINGULAIR) 10 MG tablet; Take 1 tablet (10 mg total) by mouth at bedtime. -     CBC with Differential/Platelet; Future -     Comprehensive metabolic panel; Future -     Sedimentation rate; Future -     Sedimentation rate -     Comprehensive metabolic panel -     CBC with Differential/Platelet -     methylPREDNISolone (MEDROL DOSEPAK) 4 MG TBPK tablet; TAKE AS DIRECTED  Nonallopathic lesion of lumbosacral region -     tiZANidine (ZANAFLEX) 4 MG capsule; TAKE 1 CAPSULE EVERY NIGHT  Dysuria-urine culture is positive for E. coli. -     Urinalysis, Routine w reflex microscopic; Future -     CULTURE, URINE COMPREHENSIVE; Future -     Chlamydia/Gonococcus/Trichomonas, NAA; Future  Angioedema, initial encounter -     methylPREDNISolone (MEDROL DOSEPAK) 4 MG TBPK tablet; TAKE AS DIRECTED  E. coli UTI (urinary tract infection) -     nitrofurantoin, macrocrystal-monohydrate, (MACROBID) 100 MG capsule; Take 1 capsule (100 mg total) by mouth 2 (two) times daily for 5 days.   I am  having Laura Guerra. Laura Guerra start on terbinafine, doxepin, montelukast, methylPREDNISolone, and nitrofurantoin (macrocrystal-monohydrate). I am also having Laura Guerra maintain Laura Guerra levonorgestrel and tiZANidine.  Meds ordered this encounter  Medications  . terbinafine (LAMISIL) 250 MG tablet    Sig: Take 1 tablet (250 mg total) by mouth daily for 10 days.    Dispense:  10 tablet    Refill:  0  . doxepin (SINEQUAN) 10 MG capsule    Sig: Take 1 capsule (10 mg total) by mouth at bedtime.    Dispense:  90 capsule    Refill:  1  . montelukast (SINGULAIR) 10 MG tablet    Sig: Take 1 tablet (10 mg total) by mouth at bedtime.    Dispense:  90 tablet    Refill:  1  . tiZANidine (ZANAFLEX) 4 MG capsule    Sig: TAKE 1 CAPSULE EVERY NIGHT    Dispense:  90 capsule    Refill:  1  . methylPREDNISolone (MEDROL DOSEPAK) 4 MG TBPK tablet    Sig: TAKE AS DIRECTED    Dispense:  21 tablet    Refill:  0  . nitrofurantoin, macrocrystal-monohydrate, (MACROBID) 100 MG capsule    Sig: Take 1 capsule (100 mg total) by mouth 2 (two) times daily for 5 days.    Dispense:  10 capsule    Refill:  0   In addition to time spent on CPE, I spent 50 minutes in preparing to see the patient by review of recent labs, imaging and procedures, obtaining and reviewing separately obtained history, communicating with the patient and family or caregiver, ordering medications, tests or procedures, and documenting clinical information in the EHR including the differential Dx, treatment, and any further evaluation and other management of 1. Tinea corporis 2. Urticaria 3. Nonallopathic lesion of lumbosacral region 4. Dysuria 5. Angioedema, initial encounter 6. E. coli UTI (urinary tract infection)  Follow-up: Return in about 4 weeks (around 02/19/2020).  Scarlette Calico, MD

## 2020-01-23 ENCOUNTER — Other Ambulatory Visit: Payer: BC Managed Care – PPO

## 2020-01-23 ENCOUNTER — Encounter: Payer: Self-pay | Admitting: Internal Medicine

## 2020-01-23 DIAGNOSIS — R300 Dysuria: Secondary | ICD-10-CM

## 2020-01-24 ENCOUNTER — Other Ambulatory Visit: Payer: Self-pay | Admitting: Internal Medicine

## 2020-01-24 ENCOUNTER — Encounter: Payer: Self-pay | Admitting: Internal Medicine

## 2020-01-24 LAB — URINALYSIS, ROUTINE W REFLEX MICROSCOPIC
Bilirubin Urine: NEGATIVE
Ketones, ur: NEGATIVE
Leukocytes,Ua: NEGATIVE
Nitrite: POSITIVE — AB
Specific Gravity, Urine: 1.025 (ref 1.000–1.030)
Total Protein, Urine: NEGATIVE
Urine Glucose: NEGATIVE
Urobilinogen, UA: 0.2 (ref 0.0–1.0)
pH: 6 (ref 5.0–8.0)

## 2020-01-24 MED ORDER — METHYLPREDNISOLONE 4 MG PO TBPK: ORAL_TABLET | ORAL | 0 refills | Status: DC

## 2020-01-25 ENCOUNTER — Encounter: Payer: Self-pay | Admitting: Internal Medicine

## 2020-01-25 DIAGNOSIS — B9620 Unspecified Escherichia coli [E. coli] as the cause of diseases classified elsewhere: Secondary | ICD-10-CM | POA: Insufficient documentation

## 2020-01-25 DIAGNOSIS — T783XXA Angioneurotic edema, initial encounter: Secondary | ICD-10-CM | POA: Insufficient documentation

## 2020-01-25 LAB — CULTURE, URINE COMPREHENSIVE

## 2020-01-25 MED ORDER — NITROFURANTOIN MONOHYD MACRO 100 MG PO CAPS: 100.0000 mg | ORAL_CAPSULE | Freq: Two times a day (BID) | ORAL | 0 refills | Status: AC

## 2020-01-26 LAB — CHLAMYDIA/GONOCOCCUS/TRICHOMONAS, NAA
Chlamydia by NAA: NEGATIVE
Gonococcus by NAA: NEGATIVE
Trich vag by NAA: NEGATIVE

## 2020-01-29 ENCOUNTER — Encounter: Payer: Self-pay | Admitting: Obstetrics and Gynecology

## 2020-01-29 ENCOUNTER — Other Ambulatory Visit: Payer: Self-pay

## 2020-01-29 ENCOUNTER — Ambulatory Visit (INDEPENDENT_AMBULATORY_CARE_PROVIDER_SITE_OTHER): Payer: BC Managed Care – PPO | Admitting: Obstetrics and Gynecology

## 2020-01-29 DIAGNOSIS — Z01419 Encounter for gynecological examination (general) (routine) without abnormal findings: Secondary | ICD-10-CM | POA: Diagnosis not present

## 2020-01-29 NOTE — Progress Notes (Signed)
34 y.o. [id number] Single Caucasian female here for annual exam. Patient does not want IUD removed.   Seen for one month hx of nipple discharge on 12/25/19.  Normal TSH and prolactin.  Itching of the nipple is random.  No more discharge.   E Coli UTI 01/23/20. Neg GC/CT/trich testing on 01/23/20.  Works for [employer] in a full time position.   PCP:  Sanda Linger, MD   Patient's last menstrual period was 01/27/2020.           Sexually active: Yes.    The current method of family planning is Mirena IUD 08/06/17.    Exercising: No.  The patient does not participate in regular exercise at present. Smoker:  no  Health Maintenance: Pap:  04-17-16 ASCUS :Neg HR HPV  02-04-12 Neg History of abnormal Pap:  yes MMG:  n/a TDaP:  09/28/14 Gardasil:   no HIV: 03/17/12 NR Hep C: never Screening Labs:  PCP   reports that she has been smoking cigarettes. She has a 20.00 pack-year smoking history. She has never used smokeless tobacco. She reports previous drug use. Drug: Marijuana. She reports that she does not drink alcohol.  Past Medical History:  Diagnosis Date  . No pertinent past medical history   . Seasonal depression (HCC)     Past Surgical History:  Procedure Laterality Date  . NO PAST SURGERIES    . WISDOM TOOTH EXTRACTION      Current Outpatient Medications  Medication Sig Dispense Refill  . doxepin (SINEQUAN) 10 MG capsule Take 1 capsule (10 mg total) by mouth at bedtime. 90 capsule 1  . levonorgestrel (MIRENA) 20 MCG/24HR IUD 1 each by Intrauterine route once.    . methylPREDNISolone (MEDROL DOSEPAK) 4 MG TBPK tablet TAKE AS DIRECTED 21 tablet 0  . montelukast (SINGULAIR) 10 MG tablet Take 1 tablet (10 mg total) by mouth at bedtime. 90 tablet 1  . nitrofurantoin, macrocrystal-monohydrate, (MACROBID) 100 MG capsule Take 1 capsule (100 mg total) by mouth 2 (two) times daily for 5 days. 10 capsule 0  . terbinafine (LAMISIL) 250 MG tablet Take 1 tablet (250 mg total) by mouth daily for 10  days. 10 tablet 0  . tiZANidine (ZANAFLEX) 4 MG capsule TAKE 1 CAPSULE EVERY NIGHT 90 capsule 1   No current facility-administered medications for this visit.    Family History  Problem Relation Age of Onset  . Cancer Father        unknown type.  deceased age 84.  . Cancer Maternal Grandmother        breast  . Breast cancer Maternal Grandmother   . Cancer Paternal Uncle        unknown type.    Review of Systems  Constitutional: Negative.   HENT: Negative.   Eyes: Negative.   Respiratory: Negative.   Cardiovascular: Negative.   Gastrointestinal: Negative.   Endocrine: Negative.   Genitourinary: Negative.   Musculoskeletal: Negative.   Skin: Negative.   Allergic/Immunologic: Negative.   Neurological: Negative.   Hematological: Negative.   Psychiatric/Behavioral: Negative.     Exam:   BP 116/60 (BP Location: Right Arm, Patient Position: Sitting, Cuff Size: Normal)   Pulse 72   Temp 98.4 F (36.9 C) (Temporal)   Resp 12   Ht 5\' 6"  (1.676 m)   Wt 210 lb 6.4 oz (95.4 kg)   LMP 01/27/2020   BMI 33.96 kg/m     General appearance: alert, cooperative and appears stated age Head: normocephalic, without obvious abnormality, atraumatic  Neck: no adenopathy, supple, symmetrical, trachea midline and thyroid normal to inspection and palpation Lungs: clear to auscultation bilaterally Breasts: normal appearance, no masses or tenderness, No nipple retraction or dimpling, No nipple discharge or bleeding, No axillary adenopathy Heart: regular rate and rhythm Abdomen: soft, non-tender; no masses, no organomegaly Extremities: extremities normal, atraumatic, no cyanosis or edema Skin: skin color, texture, turgor normal. No rashes or lesions Lymph nodes: cervical, supraclavicular, and axillary nodes normal. Neurologic: grossly normal  Pelvic: External genitalia:  no lesions              No abnormal inguinal nodes palpated.              Urethra:  normal appearing urethra with no  masses, tenderness or lesions              Bartholins and Skenes: normal                 Vagina: normal appearing vagina with normal color and discharge, no lesions              Cervix: no lesions.  IUD strings noted.               Pap taken: Yes.   Bimanual Exam:  Uterus:  normal size, contour, position, consistency, mobility, non-tender              Adnexa: no mass, fullness, tenderness              Chaperone was present for exam.  Assessment:   Well woman visit with normal exam. Mirena IUD.  Smoker.  ASCUS pap and neg HR HPV. Recent E. Coli UTI.   Plan: Mammogram screening discussed. Self breast awareness reviewed. Pap and HR HPV as above. Guidelines for Calcium, Vitamin D, regular exercise program including cardiovascular and weight bearing exercise. We discussed Mirena approved for 6 years of use.  Follow up annually and prn.   After visit summary provided.

## 2020-01-29 NOTE — Patient Instructions (Signed)

## 2020-01-30 ENCOUNTER — Encounter: Payer: Self-pay | Admitting: Internal Medicine

## 2020-01-31 ENCOUNTER — Other Ambulatory Visit: Payer: Self-pay | Admitting: Internal Medicine

## 2020-01-31 ENCOUNTER — Other Ambulatory Visit (HOSPITAL_COMMUNITY)
Admission: RE | Admit: 2020-01-31 | Discharge: 2020-01-31 | Disposition: A | Discharge status: Home / Self Care | Payer: BC Managed Care – PPO | Source: Ambulatory Visit | Attending: Obstetrics and Gynecology | Admitting: Obstetrics and Gynecology

## 2020-01-31 ENCOUNTER — Other Ambulatory Visit: Payer: BC Managed Care – PPO

## 2020-01-31 ENCOUNTER — Encounter: Payer: Self-pay | Admitting: Internal Medicine

## 2020-01-31 DIAGNOSIS — Z01419 Encounter for gynecological examination (general) (routine) without abnormal findings: Secondary | ICD-10-CM | POA: Diagnosis present

## 2020-01-31 DIAGNOSIS — B9620 Unspecified Escherichia coli [E. coli] as the cause of diseases classified elsewhere: Secondary | ICD-10-CM

## 2020-01-31 DIAGNOSIS — N390 Urinary tract infection, site not specified: Secondary | ICD-10-CM

## 2020-01-31 LAB — HM PAP SMEAR

## 2020-02-01 ENCOUNTER — Encounter: Payer: Self-pay | Admitting: Internal Medicine

## 2020-02-01 LAB — URINALYSIS, ROUTINE W REFLEX MICROSCOPIC
Bilirubin Urine: NEGATIVE
Ketones, ur: NEGATIVE
Leukocytes,Ua: NEGATIVE
Nitrite: NEGATIVE
Specific Gravity, Urine: 1.01 (ref 1.000–1.030)
Total Protein, Urine: NEGATIVE
Urine Glucose: NEGATIVE
Urobilinogen, UA: 0.2 (ref 0.0–1.0)
WBC, UA: NONE SEEN (ref 0–?)
pH: 6 (ref 5.0–8.0)

## 2020-02-02 LAB — CYTOLOGY - PAP
Comment: NEGATIVE
Diagnosis: NEGATIVE
High risk HPV: NEGATIVE

## 2020-02-02 LAB — CULTURE, URINE COMPREHENSIVE: RESULT:: NO GROWTH

## 2020-02-21 ENCOUNTER — Other Ambulatory Visit: Payer: Self-pay | Admitting: Internal Medicine

## 2020-02-21 DIAGNOSIS — T783XXA Angioneurotic edema, initial encounter: Secondary | ICD-10-CM

## 2020-02-21 DIAGNOSIS — L509 Urticaria, unspecified: Secondary | ICD-10-CM

## 2020-02-21 MED ORDER — LEVOCETIRIZINE DIHYDROCHLORIDE 5 MG PO TABS: 5.0000 mg | ORAL_TABLET | Freq: Every evening | ORAL | 1 refills | Status: DC

## 2020-03-07 ENCOUNTER — Encounter: Payer: Self-pay | Admitting: Internal Medicine

## 2020-03-08 ENCOUNTER — Other Ambulatory Visit: Payer: Self-pay

## 2020-03-08 DIAGNOSIS — L509 Urticaria, unspecified: Secondary | ICD-10-CM

## 2020-03-08 MED ORDER — MONTELUKAST SODIUM 10 MG PO TABS: 10.0000 mg | ORAL_TABLET | Freq: Every day | ORAL | 1 refills | Status: DC

## 2020-04-22 ENCOUNTER — Other Ambulatory Visit: Payer: Self-pay

## 2020-04-22 ENCOUNTER — Encounter (HOSPITAL_COMMUNITY): Payer: Self-pay | Admitting: Emergency Medicine

## 2020-04-22 ENCOUNTER — Emergency Department (HOSPITAL_COMMUNITY)
Admission: EM | Admit: 2020-04-22 | Discharge: 2020-04-23 | Disposition: A | Discharge status: Home / Self Care | Payer: BC Managed Care – PPO | Attending: Emergency Medicine | Admitting: Emergency Medicine

## 2020-04-22 DIAGNOSIS — F17210 Nicotine dependence, cigarettes, uncomplicated: Secondary | ICD-10-CM | POA: Insufficient documentation

## 2020-04-22 DIAGNOSIS — T63441A Toxic effect of venom of bees, accidental (unintentional), initial encounter: Secondary | ICD-10-CM | POA: Insufficient documentation

## 2020-04-22 DIAGNOSIS — T7840XA Allergy, unspecified, initial encounter: Secondary | ICD-10-CM

## 2020-04-22 MED ORDER — FAMOTIDINE IN NACL 20-0.9 MG/50ML-% IV SOLN
20.0000 mg | Freq: Once | INTRAVENOUS | Status: AC
  Administered 2020-04-23: 20 mg via INTRAVENOUS
  Filled 2020-04-22: qty 50

## 2020-04-22 MED ORDER — DIPHENHYDRAMINE HCL 25 MG PO CAPS
50.0000 mg | ORAL_CAPSULE | Freq: Once | ORAL | Status: AC
  Administered 2020-04-23: 50 mg via ORAL
  Filled 2020-04-22: qty 2

## 2020-04-22 MED ORDER — METHYLPREDNISOLONE SODIUM SUCC 125 MG IJ SOLR
125.0000 mg | Freq: Once | INTRAMUSCULAR | Status: AC
  Administered 2020-04-23: 125 mg via INTRAVENOUS
  Filled 2020-04-22: qty 2

## 2020-04-22 NOTE — ED Provider Notes (Signed)
[facility] EMERGENCY DEPARTMENT Provider Note   CSN: [id number] Arrival date & time: 04/22/20  2309     History Chief Complaint  Patient presents with  . Allergic Reaction    Laura Guerra is a 34 y.o. female.  The history is provided by the patient. No language interpreter was used.  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  Patient report approximately 3 to 4 hours ago she was outside when she was stung by an insect possibly a bee or a wasp to her left wrist.  She endorsed acute onset of pain with associated swelling and redness to the affected area.  Pain is sharp throbbing moderate in severity.  About an hour later she started to notice irritation to the back of her throat and having some difficulty swallowing and states that her tongue felt weird.  She also endorsed having some shortness of breath.  She felt her symptom is improving and felt it may have been a panic attack instead.  She still endorse an uncomfortable sensation to the back of her throat.  She did not complain of lightheadedness or dizziness no complaints of wheezing or abdominal cramping and she did not develop any hives.  She denies any specific treatment tried.  No nausea or vomiting.  Past Medical History:  Diagnosis Date  . No pertinent past medical history   . Seasonal depression [facility]     Patient Active Problem List   Diagnosis Date Noted  . Angio-edema 01/25/2020  . E. coli UTI (urinary tract infection) 01/25/2020  . Urticaria 01/22/2020  . 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 Allergic rhinitis 10/08/2015  . Routine general medical examination at a health care facility 03/11/2015  . Tobacco use disorder 02/28/2015    Past Surgical History:  Procedure Laterality Date  . NO PAST SURGERIES    . WISDOM TOOTH EXTRACTION       OB History    Gravida    2   Para  2   Term  2   Preterm  0   AB  0   Living  2     SAB  0   TAB  0   Ectopic  0   Multiple  0   Live Births  2           Family History  Problem Relation Age of Onset  . Cancer Father        unknown type.  deceased age 64.  . Cancer Maternal Grandmother        breast  . Breast cancer Maternal Grandmother   . Cancer Paternal Uncle        unknown type.    Social History   Tobacco Use  . Smoking status: Current Every Day Smoker    Packs/day: 1.00    Years: 20.00    Pack years: 20.00    Types: Cigarettes  . Smokeless tobacco: Never Used  Vaping Use  . Vaping Use: Never used  Substance Use Topics  . Alcohol use: No    Comment: occasionally  . Drug use: Not Currently    Types: Marijuana    Home Medications Prior to Admission medications   Medication Sig Start Date End Date Taking? Authorizing Provider  levocetirizine (XYZAL) 5 MG tablet Take 1 tablet (5 mg total) by mouth every evening. 02/21/20   Etta Grandchild, MD  levonorgestrel (MIRENA) 20 MCG/24HR IUD 1 each by Intrauterine route once.    [provider]  montelukast (SINGULAIR) 10 MG tablet Take 1 tablet (10 mg total) by mouth at bedtime. 03/08/20   Etta Grandchild, MD  tiZANidine (ZANAFLEX) 4 MG capsule TAKE 1 CAPSULE EVERY NIGHT 01/22/20   Etta Grandchild, MD    Allergies    Bee venom and Pennsaid [diclofenac sodium]  Review of Systems   Review of Systems  All other systems reviewed and are negative.   Physical Exam Updated Vital Signs BP (!) 148/88 (BP Location: Right Arm)   Pulse 71   Temp 98.5 F (36.9 C) (Oral)   Resp 16   SpO2 97%   Physical Exam Vitals and nursing note reviewed.  Constitutional:      General: She is not in acute distress.    Appearance: She is well-developed.  HENT:     Head: Atraumatic.     Mouth/Throat:     Mouth: Mucous membranes are moist.     Comments: Bilateral tonsillar enlargement however normal appearance of the tongue and  oral mucosa.  No stridor or trismus. Eyes:     Conjunctiva/sclera: Conjunctivae normal.  Cardiovascular:     Rate and Rhythm: Normal rate and regular rhythm.     Pulses: Normal pulses.     Heart sounds: Normal heart sounds.  Pulmonary:     Effort: Pulmonary effort is normal.     Breath sounds: Normal breath sounds. No wheezing.  Abdominal:     Palpations: Abdomen is soft.     Tenderness: There is no abdominal tenderness.  Musculoskeletal:        General: Tenderness (Left arm: Localized skin irritation noted to the volar aspect of the wrist with surrounding erythema warmth and edema involving the dorsum of the hand and part of the wrist.  Radial pulse 2+.) present.     Cervical back: Neck supple.  Skin:    Findings: No rash.  Neurological:     Mental Status: She is alert and oriented to person, place, and time.  Psychiatric:        Mood and Affect: Mood normal.     ED Results / Procedures / Treatments   Labs (all labs ordered are listed, but only abnormal results are displayed) Labs Reviewed - No data to display  EKG None  Radiology No results found.  Procedures Procedures (including critical care time)  Medications Ordered in ED Medications  famotidine (PEPCID) IVPB 20 mg premix (0 mg Intravenous Stopped 04/23/20 0126)  diphenhydrAMINE (BENADRYL) capsule 50 mg (50 mg Oral Given 04/23/20 0019)  methylPREDNISolone sodium succinate (SOLU-MEDROL) 125 mg/2 mL injection 125 mg (125 mg Intravenous Given 04/23/20 0020)    ED Course  I have reviewed the triage vital signs and the nursing notes.  Pertinent labs & imaging results that were available during my care of the patient were reviewed by me and considered in my medical decision making (see chart for details).    MDM Rules/Calculators/A&P                          BP 109/76   Pulse 65   Temp 98.5 F (36.9 C) (Oral)   Resp 16   SpO2 98%   Final Clinical Impression(s) / ED Diagnoses Final diagnoses:  Allergic  reaction, initial encounter    Rx / DC Orders ED Discharge Orders         Ordered  predniSONE (DELTASONE) 20 MG tablet   Once     Discontinue  Reprint     04/23/20 0141    diphenhydrAMINE (BENADRYL) 25 MG tablet  Every 6 hours PRN     Discontinue  Reprint     04/23/20 0141    EPINEPHrine 0.3 mg/0.3 mL IJ SOAJ injection  As needed     Discontinue  Reprint     04/23/20 0141         11:59 PM Patient was stung by an insect several hours ago and now complaining of having shortness of breath, and discomfort to the back of her throat.  She does have localized skin irritation from the insect bite involving her left wrist.  Her throat exam is unremarkable.  Lungs are clear to auscultation, she is not hypotensive.  We will give steroid, Benadryl, Pepcid and will monitor closely.  At this time, I do not think she would benefit from epinephrine.  1:43 AM On recheck, patient states she felt better.  She is able to swallow without difficulty.  Reexamination of her throat and lungs are unremarkable.  Patient discharged home with prednisone and Benadryl.  EpiPen provided in the event that she get stung again and developed anaphylaxis reaction.  Return precaution discussed.   Fayrene Helper, PA-C 04/23/20 1610    Shon Baton, MD 04/23/20 [phone]

## 2020-04-22 NOTE — ED Triage Notes (Signed)
Pt reports she was stung by a bee tonight, left hand and wrist swollen. Pt reports shortness of breath, difficulty swallowing and that her tongue "feels weird".

## 2020-04-23 MED ORDER — EPINEPHRINE 0.3 MG/0.3ML IJ SOAJ
0.3000 mg | INTRAMUSCULAR | 0 refills | Status: DC | PRN
Start: 2020-04-23 — End: 2020-05-14

## 2020-04-23 MED ORDER — DIPHENHYDRAMINE HCL 25 MG PO TABS: 50.0000 mg | ORAL_TABLET | Freq: Four times a day (QID) | ORAL | 0 refills | Status: DC | PRN

## 2020-04-23 MED ORDER — PREDNISONE 20 MG PO TABS
60.0000 mg | ORAL_TABLET | Freq: Once | ORAL | 0 refills | Status: AC
Start: 2020-04-23 — End: 2020-04-23

## 2020-04-23 NOTE — Discharge Instructions (Signed)
Take prednisone tomorrow as prescribed.  Take benadryl as needed for allergy/itch.  Use epipen if you get stung and come to the ER promptly.

## 2020-05-02 ENCOUNTER — Other Ambulatory Visit: Payer: BC Managed Care – PPO

## 2020-05-02 DIAGNOSIS — L509 Urticaria, unspecified: Secondary | ICD-10-CM

## 2020-05-02 DIAGNOSIS — T783XXA Angioneurotic edema, initial encounter: Secondary | ICD-10-CM

## 2020-05-09 ENCOUNTER — Other Ambulatory Visit: Payer: Self-pay | Admitting: Internal Medicine

## 2020-05-09 DIAGNOSIS — T783XXA Angioneurotic edema, initial encounter: Secondary | ICD-10-CM

## 2020-05-09 LAB — ALPHA-GAL PANEL
Beef IgE: <0.1 kU/L (ref <0.35)
Class: 0
Class: 0
Class: 0
Galactose-alpha-1,3-galactose IgE: 0.23 kU/L — ABNORMAL HIGH (ref <0.10)
LAMB/MUTTON IGE: <0.1 kU/L (ref <0.35)
Pork IgE: <0.1 kU/L (ref <0.35)

## 2020-05-14 ENCOUNTER — Ambulatory Visit (INDEPENDENT_AMBULATORY_CARE_PROVIDER_SITE_OTHER): Payer: BC Managed Care – PPO | Admitting: Allergy & Immunology

## 2020-05-14 ENCOUNTER — Encounter: Payer: Self-pay | Admitting: Allergy & Immunology

## 2020-05-14 ENCOUNTER — Other Ambulatory Visit: Payer: Self-pay

## 2020-05-14 VITALS — BP 98/74 | HR 73 | Temp 98.2°F | Resp 16 | Ht 65.0 in | Wt 218.2 lb

## 2020-05-14 DIAGNOSIS — J302 Other seasonal allergic rhinitis: Secondary | ICD-10-CM

## 2020-05-14 DIAGNOSIS — J3089 Other allergic rhinitis: Secondary | ICD-10-CM

## 2020-05-14 DIAGNOSIS — L500 Allergic urticaria: Secondary | ICD-10-CM

## 2020-05-14 DIAGNOSIS — T7800XD Anaphylactic reaction due to unspecified food, subsequent encounter: Secondary | ICD-10-CM

## 2020-05-14 NOTE — Progress Notes (Signed)
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 Allergic urticaria - Your history does not have any "red flags" such as fevers, joint pains, or permanent skin changes that would be concerning for a more serious cause of hives.  - We will get some labs to rule out serious causes of hives: tryptase level, chronic urticaria panel, ESR, and CRP. - Chronic hives are often times a self limited process and will "burn themselves out" over 6-12 months, although this is not always the case.  - You do have several triggers for your hives.  - In the meantime, start suppressive dosing of antihistamines:   - Morning: Xyzal (levocetirizine) 66m (one tablet)   - Evening: Xyzal (levocetirizine) 567m(one tablet)   - If the above is not working, try adding: Pepcid (famotidine) 2040m You can change this dosing at home, decreasing the dose as needed or increasing the dosing as needed.  - If you are not tolerating the medications or are tired of taking them every day, we can start treatment with a monthly injectable medication called Xolair.   2. 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 - Avoid all red meats. - Anaphylaxis management plan provided.  - [facility] provided. - They should call you in 1-2 days to confirm your shipping address.   3. Seasonal and perennial allergic rhinitis - Testing today showed: grasses, ragweed, weeds, trees, indoor molds, outdoor molds, dust mites, cat and cockroach - Copy of test results provided.  - Avoidance measures  provided. - Continue with: Xyzal (levocetirizine) 5mg28m2 times daily - Start taking: Flonase (fluticasone) one spray per nostril daily - You can use an extra dose of the antihistamine, if needed, for breakthrough symptoms.  - Consider nasal saline rinses 1-2 times daily to remove allergens from the nasal cavities as well as help with mucous clearance (this is especially helpful to do before the nasal sprays are given) - Consider allergy shots as a means of long-term control. - Allergy shots "re-train" and "reset" the immune system to ignore environmental allergens and decrease the resulting immune response to those allergens (sneezing, itchy watery eyes, runny nose, nasal congestion, etc).    - Allergy shots improve symptoms in 75-85% of patients.  - We can discuss more at the next appointment if the medications are not working for you.   4. Return in about 6 weeks (around 06/25/2020). This can be an in-person, a virtual Webex or a telephone follow up visit.   Subjective:   Laura Guerra 34 y12. female presenting today for evaluation of  Chief Complaint  Patient presents with  . Allergic Reaction    Having itching, swelling, and rashes all over her body. She is unsure of what is causes the symptoms. Her PCP did red meat blood test and seems to beleive that is what's causing her to breakout    Laura Guerra a history of the following: Patient Active Problem List   Diagnosis Date Noted  . Angio-edema 01/25/2020  . E. coli UTI (urinary tract infection) 01/25/2020  .  Urticaria 01/22/2020  . 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 Allergic rhinitis 10/08/2015  . Routine general medical examination at a health care facility 03/11/2015  . Tobacco use disorder 02/28/2015    History obtained from: chart review and patient.  Laura Guerra was referred by Janith Lima, MD.     [name] is a 34 y.o. female presenting for  an evaluation of hives and allergies.  She has had hives that started in March 2021. One week before, she was at [facility] with some animals and thinks that she had a tick bite. She did not remove it and never really saw it. She did have a tick bite looking thing at some point.  Hives come and go. They stay away if she takes levocetirizine daily at night. There are no permanent skin changes. The hives itched like "crazy". She does not notice that it is associated with food. She has not been eating any red meat because of concern for the alpha gal.   She did just get back from the beach. She laid out in the sun and she had red splotches over her entire legs where the sun hit her skin. These were distinct from her other rash. She was also stung by a bee two weeks ago. She was proscribed Benadryl and had a tingling sensation of her tongue. This was the first time that she was ever stung.   She does have some intermittent allergic rhinitis symptoms, but she does not take anything on a routine basis for it. She has never been allergy tested in the past.   Otherwise, there is no history of other atopic diseases, including asthma, food allergies, drug allergies, stinging insect allergies, eczema or contact dermatitis. There is no significant infectious history. Vaccinations are up to date.    Past Medical History: Patient Active Problem List   Diagnosis Date Noted  . Angio-edema 01/25/2020  . E. coli UTI (urinary tract infection) 01/25/2020  . Urticaria 01/22/2020  . 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 Allergic rhinitis 10/08/2015  . Routine general medical examination at a health care facility 03/11/2015  . Tobacco use disorder 02/28/2015    Medication List:  Allergies as of 05/14/2020      Reactions   Bee Venom    Pennsaid [diclofenac Sodium]    Local rash      Medication List       Accurate as of May 14, 2020 11:59 PM. If you have any  questions, ask your nurse or doctor.        diphenhydrAMINE 25 MG tablet Commonly known as: BENADRYL Take 2 tablets (50 mg total) by mouth every 6 (six) hours as needed for itching or allergies.   EPINEPHrine 0.3 mg/0.3 mL Soaj injection Commonly known as: Auvi-Q Inject 0.3 mLs (0.3 mg total) into the muscle once for 1 dose. As directed for life-threatening allergic reactions What changed:   when to take this  reasons to take this  additional instructions Changed by: Valentina Shaggy, MD   levocetirizine 5 MG tablet Commonly known as: Xyzal Take 1 tablet (5 mg total) by mouth every evening.   levonorgestrel 20 MCG/24HR IUD Commonly known as: MIRENA 1 each by Intrauterine route once.   montelukast 10 MG tablet Commonly known as: SINGULAIR Take 1 tablet (10 mg total) by mouth at bedtime.   tiZANidine 4 MG capsule Commonly known as: ZANAFLEX TAKE 1 CAPSULE  EVERY NIGHT       Birth History: non-contributory  Developmental History: non-contributory  Past Surgical History: Past Surgical History:  Procedure Laterality Date  . NO PAST SURGERIES    . WISDOM TOOTH EXTRACTION       Family History: Family History  Problem Relation Age of Onset  . Cancer Father        unknown type.  deceased age 85.  . Cancer Maternal Grandmother        breast  . Breast cancer Maternal Grandmother   . Cancer Paternal Uncle        unknown type.     Social History: Kana lives at home with her family.  She lives in a house that was built in 1959.  There is hardwood in the main living areas and tiling in the bedroom.  She has electric heating and central cooling.  There is a [place] inside of the home.  There are no dust mite covers on the bedding.  She is exposed to tobacco in the car, but not the house.  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  She does not have a HEPA filter in the home.  She is exposed to chemicals, fumes, and dust in her work environment.   [place]   Review of Systems  Constitutional: Negative.  Negative for chills, fever, malaise/fatigue and weight loss.  HENT: Negative.  Negative for congestion, ear discharge, ear pain and sinus pain.        Positive for intermittent sneezing.  Eyes: Negative for pain, discharge and redness.  Respiratory: Negative for cough, sputum production, shortness of breath and wheezing.   Cardiovascular: Negative.  Negative for chest pain and palpitations.  Gastrointestinal: Negative for abdominal pain, constipation, diarrhea, heartburn, nausea and vomiting.  Skin: Positive for itching and rash.  Neurological: Negative for dizziness and headaches.  Endo/Heme/Allergies: Negative for environmental allergies. Does not bruise/bleed easily.       Objective:   Blood pressure 98/74, pulse 73, temperature 98.2 F (36.8 C), resp. rate 16, height _0  (1.651 m), weight 218 lb 3.2 oz (99 kg), SpO2 96 %. Body mass index is 36.31 kg/m.   Physical Exam:   Physical Exam Constitutional:      Appearance: She is well-developed.     Comments: Boisterous and amusing.   HENT:     Head: Normocephalic and atraumatic.     Right Ear: Tympanic membrane, ear canal and external ear normal. No drainage, swelling or tenderness. Tympanic membrane is not injected, scarred, erythematous, retracted or bulging.     Left Ear: Tympanic membrane, ear canal and external ear normal. No drainage, swelling or tenderness. Tympanic membrane is not injected, scarred, erythematous, retracted or bulging.     Nose: No nasal deformity, septal deviation, mucosal edema or rhinorrhea.     Right Turbinates: Enlarged and swollen.     Left Turbinates: Enlarged and swollen.     Right Sinus: No maxillary sinus tenderness or frontal sinus tenderness.     Left Sinus: No maxillary sinus tenderness or frontal sinus tenderness.     Mouth/Throat:     Mouth: Mucous membranes are not pale and not dry.     Pharynx: Uvula  midline.     Comments: Cobblestoning present.  Eyes:     General: Allergic shiner present.        Right eye: No discharge.        Left eye: No  discharge.     Conjunctiva/sclera: Conjunctivae normal.     Right eye: Right conjunctiva is not injected. No chemosis.    Left eye: Left conjunctiva is not injected. No chemosis.    Pupils: Pupils are equal, round, and reactive to light.  Cardiovascular:     Rate and Rhythm: Normal rate and regular rhythm.     Heart sounds: Normal heart sounds.  Pulmonary:     Effort: Pulmonary effort is normal. No tachypnea, accessory muscle usage or respiratory distress.     Breath sounds: Normal breath sounds. No wheezing, rhonchi or rales.     Comments: Moving air well in all lung fields. No increased work of breathing noted. Chest:     Chest wall: No tenderness.  Abdominal:     Tenderness: There is no abdominal tenderness. There is no guarding or rebound.  Lymphadenopathy:     Head:     Right side of head: No submandibular, tonsillar or occipital adenopathy.     Left side of head: No submandibular, tonsillar or occipital adenopathy.     Cervical: No cervical adenopathy.  Skin:    Coloration: Skin is not pale.     Findings: No abrasion, erythema, petechiae or rash. Rash is not papular, urticarial or vesicular.     Comments: No eczematous or urticarial lesions noted.   Neurological:     Mental Status: She is alert.  Psychiatric:        Behavior: Behavior is cooperative.      Diagnostic studies:    Allergy Studies:     Airborne Adult Perc - 05/14/20 1417    Time Antigen Placed 1417    Allergen Manufacturer Lavella Hammock    Location Back    Number of Test 59    1. Control-Buffer 50% Glycerol Negative    2. Control-Histamine 1 mg/ml 2+    3. Albumin saline Negative    4. Busby 2+    5. [place] 2+    6. Johnson Negative    7. [place] Blue 2+    8. Meadow Fescue 2+    9. Perennial Rye 2+    10. Sweet Vernal 2+    11. Timothy 2+    12. Cocklebur  Negative    13. Burweed Marshelder 2+    14. Ragweed, short Negative    15. Ragweed, Giant 2+    16. Plantain,  English 2+    17. Lamb's Quarters 2+    18. Sheep Sorrell 2+    19. Rough Pigweed 2+    20. Marsh Elder, Rough 2+    21. Mugwort, Common 2+    22. Ash mix Negative    23. Birch mix 2+    24. Beech American 2+    25. Box, Elder Negative    26. Cedar, red Negative    27. Cottonwood, [place] Negative    28. Elm mix Negative    29. Hickory 2+    30. Maple mix 3+    31. Oak, [place] mix 2+    32. Pecan Pollen 2+    33. Pine mix Negative    34. Sycamore Eastern Negative    35. Dawson, Black Pollen Negative    36. Alternaria alternata Negative    37. Cladosporium Herbarum 2+    38. Aspergillus mix 2+    39. Penicillium mix 2+    40. Bipolaris sorokiniana (Helminthosporium) 2+    41. Drechslera spicifera (Curvularia) 2+    42. Mucor plumbeus Negative  43. Fusarium moniliforme 2+    44. Aureobasidium pullulans (pullulara) 2+    45. Rhizopus oryzae 2+    46. Botrytis cinera Negative    47. Epicoccum nigrum 2+    48. Phoma betae 2+    49. Candida Albicans 2+    50. Trichophyton mentagrophytes 2+    51. Mite, D Farinae  5,000 AU/ml Negative    52. Mite, D Pteronyssinus  5,000 AU/ml 2+    53. Cat Hair 10,000 BAU/ml 2+    54.  Dog Epithelia Negative    55. Mixed Feathers Negative    56. Horse Epithelia Negative    57. Cockroach, German 2+    58. Mouse Negative    59. Tobacco Leaf Negative          Food Perc - 05/14/20 1418      Test Information   Time Antigen Placed 1418    Allergen Manufacturer Lavella Hammock    Location Back    Number of allergen test 10      Food   1. Peanut Negative    2. Soybean food Negative    3. Wheat, whole Negative    4. Sesame Negative    5. Milk, cow Negative    6. Egg White, chicken Negative    7. Casein Negative    8. Shellfish mix Negative    9. Fish mix --   +/-   10. Cashew Negative          Food Adult Perc - 05/14/20  1400    Time Antigen Placed 1437    Allergen Manufacturer Lavella Hammock    Location Back    Number of allergen test 3    37. Pork 3+    40. Beef Negative    41. Lamb 2+           Allergy testing results were read and interpreted by myself, documented by clinical staff.         Salvatore Marvel, MD Allergy and [facility] [place]

## 2020-05-14 NOTE — Patient Instructions (Addendum)
1. Allergic urticaria - Your history does not have any "red flags" such as fevers, joint pains, or permanent skin changes that would be concerning for a more serious cause of hives.  - We will get some labs to rule out serious causes of hives: tryptase level, chronic urticaria panel, ESR, and CRP. - Chronic hives are often times a self limited process and will "burn themselves out" over 6-12 months, although this is not always the case.  - You do have several triggers for your hives.  - In the meantime, start suppressive dosing of antihistamines:   - Morning: Xyzal (levocetirizine) 57m (one tablet)   - Evening: Xyzal (levocetirizine) 544m(one tablet)   - If the above is not working, try adding: Pepcid (famotidine) 2025m You can change this dosing at home, decreasing the dose as needed or increasing the dosing as needed.  - If you are not tolerating the medications or are tired of taking them every day, we can start treatment with a monthly injectable medication called Xolair.   2. 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 - Avoid all red meats. - Anaphylaxis management plan provided.  - [facility] provided. - They should call you in 1-2 days to confirm your shipping address.   3. Seasonal and perennial allergic rhinitis - Testing today showed: grasses, ragweed, weeds, trees, indoor molds, outdoor molds, dust mites, cat and cockroach - Copy of test results provided.  - Avoidance measures provided. - Continue with: Xyzal (levocetirizine) 5mg57m2 times daily - Start taking: Flonase (fluticasone) one spray per nostril daily - You can use an extra dose of the antihistamine, if needed, for breakthrough symptoms.  - Consider nasal saline rinses 1-2 times daily to remove allergens from the nasal cavities as well as help with mucous clearance (this is especially helpful to do before the nasal sprays are given) - Consider  allergy shots as a means of long-term control. - Allergy shots "re-train" and "reset" the immune system to ignore environmental allergens and decrease the resulting immune response to those allergens (sneezing, itchy watery eyes, runny nose, nasal congestion, etc).    - Allergy shots improve symptoms in 75-85% of patients.  - We can discuss more at the next appointment if the medications are not working for you.   4. Return in about 6 weeks (around 06/25/2020). This can be an in-person, a virtual Webex or a telephone follow up visit.   Please inform us [facility] Emergency Department visits, hospitalizations, or changes in symptoms. Call us [place]ore going to the ED for breathing or allergy symptoms since we might be able to fit you in for a sick visit. Feel free to contact us a[place]atime with any questions, problems, or concerns.  It was a pleasure to meet you today!  Websites that have reliable patient information: 1. American Academy of Asthma, Allergy, and Immunology: www.aaaai.org 2. Food Allergy Research and Education (FARE): foodallergy.org 3. Mothers of Asthmatics: http://www.asthmacommunitynetwork.org 4. American College of Allergy, Asthma, and Immunology: www.acaai.org   COVID-19 Vaccine Information can be found at: [email or web address] questions related to vaccine distribution or appointments, please email [email or web address] or call [phone]  "Like" us o[place]Facebook and Instagram for our latest updates!        Make sure you are registered to vote! If you have moved or changed any of your contact information, you will need to get this updated before voting!  In some cases, you MAY be able to register to vote online: [email or web address]     Reducing Pollen Exposure  The American Academy of Allergy, Asthma and Immunology suggests the following steps to reduce your exposure to pollen  during allergy seasons.    1. Do not hang sheets or clothing out to dry; pollen may collect on these items. 2. Do not mow lawns or spend time around freshly cut grass; mowing stirs up pollen. 3. Keep windows closed at night.  Keep car windows closed while driving. 4. Minimize morning activities outdoors, a time when pollen counts are usually at their highest. 5. Stay indoors as much as possible when pollen counts or humidity is high and on windy days when pollen tends to remain in the air longer. 6. Use air conditioning when possible.  Many air conditioners have filters that trap the pollen spores. 7. Use a HEPA room air filter to remove pollen form the indoor air you breathe.  Control of Mold Allergen   Mold and fungi can grow on a variety of surfaces provided certain temperature and moisture conditions exist.  Outdoor molds grow on plants, decaying vegetation and soil.  The major outdoor mold, Alternaria and Cladosporium, are found in very high numbers during hot and dry conditions.  Generally, a late Summer - Fall peak is seen for common outdoor fungal spores.  Rain will temporarily lower outdoor mold spore count, but counts rise rapidly when the rainy period ends.  The most important indoor molds are Aspergillus and Penicillium.  Dark, humid and poorly ventilated basements are ideal sites for mold growth.  The next most common sites of mold growth are the bathroom and the kitchen.  Outdoor (Seasonal) Mold Control  1. Use air conditioning and keep windows closed 2. Avoid exposure to decaying vegetation. 3. Avoid leaf raking. 4. Avoid grain handling. 5. Consider wearing a face mask if working in moldy areas.   Indoor (Perennial) Mold Control   1. Maintain humidity below 50%. 2. Clean washable surfaces with 5% bleach solution. 3. Remove sources e.g. contaminated carpets.     Control of Dust Mite Allergen    Dust mites play a major role in allergic asthma and rhinitis.  They occur  in environments with high humidity wherever human skin is found.  Dust mites absorb humidity from the atmosphere (ie, they do not drink) and feed on organic matter (including shed human and animal skin).  Dust mites are a microscopic type of insect that you cannot see with the naked eye.  High levels of dust mites have been detected from mattresses, pillows, carpets, upholstered furniture, bed covers, clothes, soft toys and any woven material.  The principal allergen of the dust mite is found in its feces.  A gram of dust may contain 1,000 mites and 250,000 fecal particles.  Mite antigen is easily measured in the air during house cleaning activities.  Dust mites do not bite and do not cause harm to humans, other than by triggering allergies/asthma.    Ways to decrease your exposure to dust mites in your home:  1. Encase mattresses, box springs and pillows with a mite-impermeable barrier or cover   2. Wash sheets, blankets and drapes weekly in hot water (130 F) with detergent and dry them in a dryer on the hot setting.  3. Have the room cleaned frequently with a vacuum cleaner and a damp dust-mop.  For carpeting or rugs, vacuuming with a vacuum cleaner equipped with a high-efficiency particulate air (  HEPA) filter.  The dust mite allergic individual should not be in a room which is being cleaned and should wait 1 hour after cleaning before going into the room. 4. Do not sleep on upholstered furniture (eg, couches).   5. If possible removing carpeting, upholstered furniture and drapery from the home is ideal.  Horizontal blinds should be eliminated in the rooms where the person spends the most time (bedroom, study, television room).  Washable vinyl, roller-type shades are optimal. 6. Remove all non-washable stuffed toys from the bedroom.  Wash stuffed toys weekly like sheets and blankets above.   7. Reduce indoor humidity to less than 50%.  Inexpensive humidity monitors can be purchased at most hardware  stores.  Do not use a humidifier as can make the problem worse and are not recommended.  Control of Dog or Cat Allergen  Avoidance is the best way to manage a dog or cat allergy. If you have a dog or cat and are allergic to dog or cats, consider removing the dog or cat from the home. If you have a dog or cat but don't want to find it a new home, or if your family wants a pet even though someone in the household is allergic, here are some strategies that may help keep symptoms at bay:  1. Keep the pet out of your bedroom and restrict it to only a few rooms. Be advised that keeping the dog or cat in only one room will not limit the allergens to that room. 2. Don't pet, hug or kiss the dog or cat; if you do, wash your hands with soap and water. 3. High-efficiency particulate air (HEPA) cleaners run continuously in a bedroom or living room can reduce allergen levels over time. 4. Regular use of a high-efficiency vacuum cleaner or a central vacuum can reduce allergen levels. 5. Giving your dog or cat a bath at least once a week can reduce airborne allergen.  Control of Cockroach Allergen  Cockroach allergen has been identified as an important cause of acute attacks of asthma, especially in urban settings.  There are fifty-five species of cockroach that exist in the [place], however only three, the [place], [place] species produce allergen that can affect patients with Asthma.  Allergens can be obtained from fecal particles, egg casings and secretions from cockroaches.    1. Remove food sources. 2. Reduce access to water. 3. Seal access and entry points. 4. Spray runways with 0.5-1% Diazinon or Chlorpyrifos 5. Blow boric acid power under stoves and refrigerator. 6. Place bait stations (hydramethylnon) at feeding sites.  Allergy Shots   Allergies are the result of a chain reaction that starts in the immune system. Your immune system controls how your body defends itself. For  instance, if you have an allergy to pollen, your immune system identifies pollen as an invader or allergen. Your immune system overreacts by producing antibodies called Immunoglobulin E (IgE). These antibodies travel to cells that release chemicals, causing an allergic reaction.  The concept behind allergy immunotherapy, whether it is received in the form of shots or tablets, is that the immune system can be desensitized to specific allergens that trigger allergy symptoms. Although it requires time and patience, the payback can be long-term relief.  How Do Allergy Shots Work?  Allergy shots work much like a vaccine. Your body responds to injected amounts of a particular allergen given in increasing doses, eventually developing a resistance and tolerance to it. Allergy shots can lead  to decreased, minimal or no allergy symptoms.  There generally are two phases: build-up and maintenance. Build-up often ranges from three to six months and involves receiving injections with increasing amounts of the allergens. The shots are typically given once or twice a week, though more rapid build-up schedules are sometimes used.  The maintenance phase begins when the most effective dose is reached. This dose is different for each person, depending on how allergic you are and your response to the build-up injections. Once the maintenance dose is reached, there are longer periods between injections, typically two to four weeks.  Occasionally doctors give cortisone-type shots that can temporarily reduce allergy symptoms. These types of shots are different and should not be confused with allergy immunotherapy shots.  Who Can Be Treated with Allergy Shots?  Allergy shots may be a good treatment approach for people with allergic rhinitis (hay fever), allergic asthma, conjunctivitis (eye allergy) or stinging insect allergy.   Before deciding to begin allergy shots, you should consider:  . The length of allergy season  and the severity of your symptoms . Whether medications and/or changes to your environment can control your symptoms . Your desire to avoid long-term medication use . Time: allergy immunotherapy requires a major time commitment . Cost: may vary depending on your insurance coverage  Allergy shots for children age 78 and older are effective and often well tolerated. They might prevent the onset of new allergen sensitivities or the progression to asthma.  Allergy shots are not started on patients who are pregnant but can be continued on patients who become pregnant while receiving them. In some patients with other medical conditions or who take certain common medications, allergy shots may be of risk. It is important to mention other medications you talk to your allergist.   When Will I Feel Better?  Some may experience decreased allergy symptoms during the build-up phase. For others, it may take as long as 12 months on the maintenance dose. If there is no improvement after a year of maintenance, your allergist will discuss other treatment options with you.  If you aren't responding to allergy shots, it may be because there is not enough dose of the allergen in your vaccine or there are missing allergens that were not identified during your allergy testing. Other reasons could be that there are high levels of the allergen in your environment or major exposure to non-allergic triggers like tobacco smoke.  What Is the Length of Treatment?  Once the maintenance dose is reached, allergy shots are generally continued for three to five years. The decision to stop should be discussed with your allergist at that time. Some people may experience a permanent reduction of allergy symptoms. Others may relapse and a longer course of allergy shots can be considered.  What Are the Possible Reactions?  The two types of adverse reactions that can occur with allergy shots are local and systemic. Common local  reactions include very mild redness and swelling at the injection site, which can happen immediately or several hours after. A systemic reaction, which is less common, affects the entire body or a particular body system. They are usually mild and typically respond quickly to medications. Signs include increased allergy symptoms such as sneezing, a stuffy nose or hives.  Rarely, a serious systemic reaction called anaphylaxis can develop. Symptoms include swelling in the throat, wheezing, a feeling of tightness in the chest, nausea or dizziness. Most serious systemic reactions develop within 30 minutes of allergy shots.  This is why it is strongly recommended you wait in your doctor's office for 30 minutes after your injections. Your allergist is trained to watch for reactions, and his or her staff is trained and equipped with the proper medications to identify and treat them.  Who Should Administer Allergy Shots?  The preferred location for receiving shots is your prescribing allergist's office. Injections can sometimes be given at another facility where the physician and staff are trained to recognize and treat reactions, and have received instructions by your prescribing allergist.

## 2020-05-15 MED ORDER — EPINEPHRINE 0.3 MG/0.3ML IJ SOAJ: 0.3000 mg | Freq: Once | INTRAMUSCULAR | 1 refills | Status: AC

## 2020-05-20 ENCOUNTER — Encounter: Payer: Self-pay | Admitting: Internal Medicine

## 2020-05-20 ENCOUNTER — Encounter: Payer: Self-pay | Admitting: Allergy & Immunology

## 2020-05-22 LAB — ANA W/REFLEX IF POSITIVE: Anti Nuclear Antibody (ANA): NEGATIVE

## 2020-05-22 LAB — C-REACTIVE PROTEIN: CRP: <1 mg/L (ref 0–10)

## 2020-05-22 LAB — TRYPTASE: Tryptase: 3.4 ug/L (ref 2.2–13.2)

## 2020-05-22 LAB — CHRONIC URTICARIA: cu index: 1.2 (ref <10)

## 2020-05-22 LAB — SEDIMENTATION RATE: Sed Rate: 2 mm/hr (ref 0–32)

## 2020-06-24 ENCOUNTER — Encounter: Payer: Self-pay | Admitting: Internal Medicine

## 2020-07-26 ENCOUNTER — Other Ambulatory Visit: Payer: Self-pay | Admitting: Internal Medicine

## 2020-07-26 DIAGNOSIS — L509 Urticaria, unspecified: Secondary | ICD-10-CM

## 2020-07-26 DIAGNOSIS — M999 Biomechanical lesion, unspecified: Secondary | ICD-10-CM

## 2020-08-30 NOTE — Progress Notes (Signed)
No show

## 2020-09-02 ENCOUNTER — Ambulatory Visit: Payer: BC Managed Care – PPO | Admitting: Family Medicine

## 2020-09-25 ENCOUNTER — Other Ambulatory Visit: Payer: Self-pay | Admitting: Family

## 2021-02-03 ENCOUNTER — Other Ambulatory Visit: Payer: Self-pay

## 2021-02-03 ENCOUNTER — Ambulatory Visit (INDEPENDENT_AMBULATORY_CARE_PROVIDER_SITE_OTHER): Payer: BC Managed Care – PPO | Admitting: Family Medicine

## 2021-02-03 ENCOUNTER — Encounter: Payer: Self-pay | Admitting: Family Medicine

## 2021-02-03 VITALS — BP 130/80 | HR 88 | Ht 65.0 in | Wt 224.0 lb

## 2021-02-03 DIAGNOSIS — M542 Cervicalgia: Secondary | ICD-10-CM | POA: Diagnosis not present

## 2021-02-03 MED ORDER — NAPROXEN 500 MG PO TABS: 500.0000 mg | ORAL_TABLET | Freq: Two times a day (BID) | ORAL | 1 refills | Status: DC

## 2021-02-03 MED ORDER — TIZANIDINE HCL 4 MG PO TABS: 4.0000 mg | ORAL_TABLET | Freq: Every evening | ORAL | 1 refills | Status: DC | PRN

## 2021-02-03 NOTE — Patient Instructions (Addendum)
Thank you for coming in today.  I've referred you to Physical Therapy.  Let us know if you don't hear from them in one week.  Heating pad can help.   Use the muscle relaxer when sleeping. Do not use with driving.  Try it first before you get on the rode.    TENS UNIT: This is helpful for muscle pain and spasm.   Search and Purchase a TENS 7000 2nd edition at  www.tenspros.com or www.Amazon.com It should be less than $30.     TENS unit instructions: Do not shower or bathe with the unit on . Turn the unit off before removing electrodes or batteries . If the electrodes lose stickiness add a drop of water to the electrodes after they are disconnected from the unit and place on plastic sheet. If you continued to have difficulty, call the TENS unit company to purchase more electrodes. . Do not apply lotion on the skin area prior to use. Make sure the skin is clean and dry as this will help prolong the life of the electrodes. . After use, always check skin for unusual red areas, rash or other skin difficulties. If there are any skin problems, does not apply electrodes to the same area. . Never remove the electrodes from the unit by pulling the wires. . Do not use the TENS unit or electrodes other than as directed. . Do not change electrode placement without consultating your therapist or physician. Marland Kitchen Keep 2 fingers with between each electrode. . Wear time ratio is 2:1, on to off times.    For example on for 30 minutes off for 15 minutes and then on for 30 minutes off for 15 minutes   Recheck in 6 weeks.

## 2021-02-03 NOTE — Progress Notes (Signed)
   I Ronelle Nigh am serving as a Neurosurgeon for Dr. Clementeen Graham.   Laura Guerra is a 35 y.o. female who presents to [facility] today for bilatreral shoulder pain. States the shoulder started bothering her last Friday. Works at [employer]. States she does not know the MOI and nothing specific makes it hurt. Used muscle relaxer that helped. Has tried ice and hot baths. Would like an antiinflammatory. States the pain hits her once a year and she holds a lot of stress in her neck and shoulder. Also has some neck pain. States she hasn't been lifting lately. 10/10 at its worse. 3 advil and 2 tylenol when it is really bad. Right now it is at a 7/10.   She works as a Hydrographic surveyor.   Pertinent review of systems: No fevers or chills  Relevant historical information: Depression   Exam:  BP 130/80 (BP Location: Left Arm, Patient Position: Sitting)   Pulse 88   Ht 5\' 5"  (1.651 m)   Wt 224 lb (101.6 kg)   SpO2 97%   BMI 37.28 kg/m  General: Well Developed, well nourished, and in no acute distress.   MSK: C-spine normal. Nontender cervical midline. Tender palpation cervical paraspinal musculature and trapezius bilaterally. Normal cervical motion with some pain with lateral flexion bilaterally. Upper extremity strength reflexes and sensation are equal normal throughout bilaterally.  Shoulders bilaterally normal-appearing nontender normal motion normal strength. Negative impingement testing.    Lab and Radiology Results  EXAM: CERVICAL SPINE - COMPLETE 4+ VIEW  COMPARISON:  None.  FINDINGS: There is no evidence of cervical spine fracture or prevertebral soft tissue swelling. Alignment is normal. No other significant bone abnormalities are identified.  IMPRESSION: Negative cervical spine radiographs.   Electronically Signed   By: M.D.   On: 07/06/2017 07:40 I, 09/05/2017, personally (independently) visualized and  performed the interpretation of the images attached in this note.    Assessment and Plan: 35 y.o. female with bilateral trapezius pain.  Pain not related to shoulder or rotator cuff etiology.  Plan for physical therapy.  Limited tizanidine.  Caution against using tizanidine while driving.  Heating pad and TENS unit may also be helpful however PT will be the main treatment modality.  Naproxen may be helpful as well. Recheck 6 weeks.   PDMP not reviewed this encounter. Orders Placed This Encounter  Procedures  . Ambulatory referral to Physical Therapy    Referral Priority:   Routine    Referral Type:   Physical Medicine    Referral Reason:   Specialty Services Required    Requested Specialty:   Physical Therapy   Meds ordered this encounter  Medications  . tiZANidine (ZANAFLEX) 4 MG tablet    Sig: Take 1 tablet (4 mg total) by mouth at bedtime as needed for muscle spasms.    Dispense:  30 tablet    Refill:  1  . naproxen (NAPROSYN) 500 MG tablet    Sig: Take 1 tablet (500 mg total) by mouth 2 (two) times daily with a meal.    Dispense:  60 tablet    Refill:  1     Discussed warning signs or symptoms. Please see discharge instructions. Patient expresses understanding.   The above documentation has been reviewed and is accurate and complete 20, M.D.

## 2021-02-13 NOTE — Progress Notes (Deleted)
35 y.o. [id number] Single Caucasian female here for annual exam.    PCP:     No LMP recorded. (Menstrual status: IUD).           Sexually active: {yes no:314532}  The current method of family planning is IUD***Mirena 08-06-17.    Exercising: {yes no:314532}  {types:19826} Smoker:  no  Health Maintenance: Pap:  01-31-20 Neg:Neg HR HPV, 04-17-16 ASCUS :Neg HR HPV,  02-04-12 Neg History of abnormal Pap:  Yes, *** MMG:  n/a Colonoscopy:  n/a BMD:   n/a  Result  n/a TDaP: 09-28-14 Gardasil:   no HIV:03-17-12 NR Hep C: no Screening Labs:  Hb today: ***, Urine today: ***   reports that she has been smoking cigarettes. She has a 20.00 pack-year smoking history. She has never used smokeless tobacco. She reports previous drug use. Drug: Marijuana. She reports that she does not drink alcohol.  Past Medical History:  Diagnosis Date  . No pertinent past medical history   . Seasonal depression (HCC)     Past Surgical History:  Procedure Laterality Date  . NO PAST SURGERIES    . WISDOM TOOTH EXTRACTION      Current Outpatient Medications  Medication Sig Dispense Refill  . diphenhydrAMINE (BENADRYL) 25 MG tablet Take 2 tablets (50 mg total) by mouth every 6 (six) hours as needed for itching or allergies. 20 tablet 0  . levocetirizine (XYZAL) 5 MG tablet Take 1 tablet (5 mg total) by mouth every evening. 90 tablet 1  . levonorgestrel (MIRENA) 20 MCG/24HR IUD 1 each by Intrauterine route once.    . naproxen (NAPROSYN) 500 MG tablet Take 1 tablet (500 mg total) by mouth 2 (two) times daily with a meal. 60 tablet 1  . tiZANidine (ZANAFLEX) 4 MG tablet Take 1 tablet (4 mg total) by mouth at bedtime as needed for muscle spasms. 30 tablet 1   No current facility-administered medications for this visit.    Family History  Problem Relation Age of Onset  . Cancer Father        unknown type.  deceased age 91.  . Cancer Maternal Grandmother        breast  . Breast cancer Maternal Grandmother   .  Cancer Paternal Uncle        unknown type.    Review of Systems  Exam:   There were no vitals taken for this visit.    General appearance: alert, cooperative and appears stated age Head: normocephalic, without obvious abnormality, atraumatic Neck: no adenopathy, supple, symmetrical, trachea midline and thyroid normal to inspection and palpation Lungs: clear to auscultation bilaterally Breasts: normal appearance, no masses or tenderness, No nipple retraction or dimpling, No nipple discharge or bleeding, No axillary adenopathy Heart: regular rate and rhythm Abdomen: soft, non-tender; no masses, no organomegaly Extremities: extremities normal, atraumatic, no cyanosis or edema Skin: skin color, texture, turgor normal. No rashes or lesions Lymph nodes: cervical, supraclavicular, and axillary nodes normal. Neurologic: grossly normal  Pelvic: External genitalia:  no lesions              No abnormal inguinal nodes palpated.              Urethra:  normal appearing urethra with no masses, tenderness or lesions              Bartholins and Skenes: normal                 Vagina: normal appearing vagina with normal color and  discharge, no lesions              Cervix: no lesions              Pap taken: {yes no:314532} Bimanual Exam:  Uterus:  normal size, contour, position, consistency, mobility, non-tender              Adnexa: no mass, fullness, tenderness              Rectal exam: {yes no:314532}.  Confirms.              Anus:  normal sphincter tone, no lesions  Chaperone was present for exam.  Assessment:   Well woman visit with normal exam.   Plan: Mammogram screening discussed. Self breast awareness reviewed. Pap and HR HPV as above. Guidelines for Calcium, Vitamin D, regular exercise program including cardiovascular and weight bearing exercise.   Follow up annually and prn.   Additional counseling given.  {yes [id number]. _______ minutes face to face time of which over 50% was  spent in counseling.    After visit summary provided.

## 2021-02-17 ENCOUNTER — Ambulatory Visit: Payer: BC Managed Care – PPO | Admitting: Obstetrics and Gynecology

## 2021-02-17 DIAGNOSIS — Z0289 Encounter for other administrative examinations: Secondary | ICD-10-CM

## 2021-03-06 ENCOUNTER — Telehealth (INDEPENDENT_AMBULATORY_CARE_PROVIDER_SITE_OTHER): Payer: BC Managed Care – PPO | Admitting: Family Medicine

## 2021-03-06 DIAGNOSIS — R6889 Other general symptoms and signs: Secondary | ICD-10-CM | POA: Diagnosis not present

## 2021-03-06 MED ORDER — OSELTAMIVIR PHOSPHATE 75 MG PO CAPS: 75.0000 mg | ORAL_CAPSULE | Freq: Two times a day (BID) | ORAL | 0 refills | Status: DC

## 2021-03-06 MED ORDER — BENZONATATE 100 MG PO CAPS: 100.0000 mg | ORAL_CAPSULE | Freq: Three times a day (TID) | ORAL | 0 refills | Status: DC | PRN

## 2021-03-06 NOTE — Patient Instructions (Addendum)
  HOME CARE TIPS:  -[place] COVID19 testing information: [email or web address] [place] [phone] Most pharmacies also offer testing and home test kits. If the Covid19 test is positive, stop the tamiflu and please make a prompt follow up visit with your primary care office or with [facility] to discuss treatment options. Treatments for Covid19 are best given early in the course of the illness.   -I sent the medication(s) we discussed to your pharmacy: Meds ordered this encounter  Medications   oseltamivir (TAMIFLU) 75 MG capsule    Sig: Take 1 capsule (75 mg total) by mouth 2 (two) times daily.    Dispense:  20 capsule    Refill:  0   benzonatate (TESSALON PERLES) 100 MG capsule    Sig: Take 1 capsule (100 mg total) by mouth 3 (three) times daily as needed.    Dispense:  20 capsule    Refill:  0     -can use tylenol or aleve if needed for fevers, aches and pains per instructions  -can use nasal saline a few times per day if you have nasal congestion; sometimes  a short course of Afrin nasal spray for 3 days can help with symptoms as well  -stay hydrated, drink plenty of fluids and eat small healthy meals - avoid dairy  -can take 1000 IU ( ) Vit D3 and 100-500 mg of Vit C daily per instructions  -If the Covid test is positive, check out the [facility] website for more information on home care, transmission and treatment for COVID19  -follow up with your doctor in 2-3 days unless improving and feeling better  -stay home while sick, except to seek medical care. If you have COVID19, ideally it would be best to stay home for a full 10 days since the onset of symptoms PLUS one day of no fever and feeling better. Wear a good mask that fits snugly (such as N95 or KN95) if around others to reduce the risk of transmission.  It was nice to meet you today, and I really hope you are feeling better soon. I help [facility] out with telemedicine visits on Tuesdays  and Thursdays and am available for visits on those days. If you have any concerns or questions following this visit please schedule a follow up visit with your Primary Care doctor or seek care at a local urgent care clinic to avoid delays in care.    Seek in person care or schedule a follow up video visit promptly if your symptoms worsen, new concerns arise or you are not improving with treatment. Call 911 and/or seek emergency care if your symptoms are severe or life threatening.

## 2021-03-06 NOTE — Progress Notes (Signed)
Virtual Visit via Video Note  I connected with Laura Guerra  on 03/06/21 at  6:20 PM EDT by a video enabled telemedicine application and verified that I am speaking with the correct person using two identifiers.  Location patient: home, [place] Location provider:work or home office Persons participating in the virtual visit: patient, provider  I discussed the limitations of evaluation and management by telemedicine and the availability of in person appointments. The patient expressed understanding and agreed to proceed.   HPI:  Acute telemedicine visit for : -Onset: yesterday -Symptoms include: cough, congestion, diarrhea, body aches, headache, low grade fevers -Denies: CP, SOB, vomiting, inability to eat/drink/get out bed, known sick contacts -Pertinent past medical history: see below and obesity w/ BMI 37 at recent visit -Pertinent medication allergies: Allergies  Allergen Reactions   Bee Venom    Pennsaid [Diclofenac Sodium]     Local rash  -COVID-19 vaccine status: not vaccinated  ROS: See pertinent positives and negatives per HPI.  Past Medical History:  Diagnosis Date   No pertinent past medical history    Seasonal depression (HCC)     Past Surgical History:  Procedure Laterality Date   NO PAST SURGERIES     WISDOM TOOTH EXTRACTION       Current Outpatient Medications:    benzonatate (TESSALON PERLES) 100 MG capsule, Take 1 capsule (100 mg total) by mouth 3 (three) times daily as needed., Disp: 20 capsule, Rfl: 0   oseltamivir (TAMIFLU) 75 MG capsule, Take 1 capsule (75 mg total) by mouth 2 (two) times daily., Disp: 20 capsule, Rfl: 0   diphenhydrAMINE (BENADRYL) 25 MG tablet, Take 2 tablets (50 mg total) by mouth every 6 (six) hours as needed for itching or allergies., Disp: 20 tablet, Rfl: 0   levocetirizine (XYZAL) 5 MG tablet, Take 1 tablet (5 mg total) by mouth every evening., Disp: 90 tablet, Rfl: 1   levonorgestrel (MIRENA) 20 MCG/24HR IUD, 1 each by Intrauterine route  once., Disp: , Rfl:    naproxen (NAPROSYN) 500 MG tablet, Take 1 tablet (500 mg total) by mouth 2 (two) times daily with a meal., Disp: 60 tablet, Rfl: 1   tiZANidine (ZANAFLEX) 4 MG tablet, Take 1 tablet (4 mg total) by mouth at bedtime as needed for muscle spasms., Disp: 30 tablet, Rfl: 1  EXAM:  VITALS per patient if applicable:  GENERAL: alert, oriented, appears well and in no acute distress  HEENT: atraumatic, conjunttiva clear, no obvious abnormalities on inspection of external nose and ears  NECK: normal movements of the head and neck  LUNGS: on inspection no signs of respiratory distress, breathing rate appears normal, no obvious gross SOB, gasping or wheezing  CV: no obvious cyanosis  MS: moves all visible extremities without noticeable abnormality  PSYCH/NEURO: pleasant and cooperative, no obvious depression or anxiety, speech and thought processing grossly intact  ASSESSMENT AND PLAN:  Discussed the following assessment and plan:  Flu-like symptoms  -we discussed possible serious and likely etiologies, options for evaluation and workup, limitations of telemedicine visit vs in person visit, treatment, treatment risks and precautions. Pt prefers to treat via telemedicine empirically rather than in person at this moment. Query COVID19, influenza vs other. She opted for starting tamiflu (as oddly have seen quite a few cases of influenza recently), tessalon for cough, other symptomatic care measures per pt instuctions and agrees to pursue covid testing. Advised if covid testing positive to stop tamiflu and schedule follow up video visit with PCP office or [facility] to discuss  treatment for covid.  Work/School slipped offered: declined Advised to seek prompt in person care if worsening, new symptoms arise, or if is not improving with treatment. Discussed options for inperson care if PCP office not available. Did let this patient know that I only do telemedicine on Tuesdays and  Thursdays for [place]. Advised to schedule follow up visit with PCP or [facility] if any further questions or concerns to avoid delays in care.   I discussed the assessment and treatment plan with the patient. The patient was provided an opportunity to ask questions and all were answered. The patient agreed with the plan and demonstrated an understanding of the instructions.     Terressa Koyanagi, DO

## 2021-03-10 ENCOUNTER — Telehealth: Payer: Self-pay | Admitting: Internal Medicine

## 2021-03-10 ENCOUNTER — Encounter: Payer: Self-pay | Admitting: Internal Medicine

## 2021-03-10 NOTE — Telephone Encounter (Signed)
Patient said that she tested positive for covid 19 last Thursday. She had a virtual with Dr. Selena Batten on 03/06/21. She said that she tested again last night and today and they were positive. She is requesting a work note. She said that she is supposed to go to work this week. She can be reached at [phone]. Please advise

## 2021-03-13 ENCOUNTER — Encounter: Payer: Self-pay | Admitting: Internal Medicine

## 2021-03-14 ENCOUNTER — Encounter: Payer: Self-pay | Admitting: Internal Medicine

## 2021-10-06 NOTE — Progress Notes (Signed)
GYNECOLOGY  VISIT   HPI: 36 y.o.   Single  Caucasian  female   [id number] with No LMP recorded. (Menstrual status: IUD).   here for  Mirena IUD removal. She wants to have one more baby.  States she does have some concerns about pregnancy at her age.   Smoker.   Some hot flashes.   GYNECOLOGIC HISTORY: No LMP recorded. (Menstrual status: IUD). Contraception:  Mirena IUD 08/06/17 Menopausal hormone therapy:  n/a Last mammogram:  n/a Last pap smear:  01-31-20 Neg:Neg HR HPV, 04-17-16 ASCUS :Neg HR HPV, 02-04-12 Neg        OB History     Gravida  2   Para  2   Term  2   Preterm  0   AB  0   Living  2      SAB  0   IAB  0   Ectopic  0   Multiple  0   Live Births  2              Patient Active Problem List   Diagnosis Date Noted   Angio-edema 01/25/2020   E. 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 20 tablet 0   levocetirizine (XYZAL) 5 MG tablet Take 1 tablet (5 mg total) by mouth every evening. 90 tablet 1   levonorgestrel (MIRENA) 20 MCG/24HR IUD 1 each by Intrauterine route once.     No current facility-administered medications for this visit.     ALLERGIES: Bee venom and Pennsaid [diclofenac sodium]  Family History  Problem Relation Age of Onset   Cancer Father        unknown type.  deceased age  30.   Cancer Maternal Grandmother        breast   Breast cancer Maternal Grandmother    Cancer Paternal Uncle        unknown type.    Social History   Socioeconomic History   Marital status: Single    Spouse name: Not on file   Number of children: 2   Years of education: 12   Highest education level: Not on file  Occupational History   Occupation: Academic librarian  Tobacco Use   Smoking status: Every Day    Packs/day: 1.00    Years: 20.00    Pack years: 20.00    Types: Cigarettes   Smokeless tobacco: Never  Vaping Use   Vaping Use: Never used  Substance and Sexual Activity   Alcohol use: No    Comment: occasionally   Drug use: Not Currently    Types: Marijuana   Sexual activity: Yes    Partners: Male    Birth control/protection: I.U.D.  Other Topics Concern   Not on file  Social History Narrative   Not on file  Social Determinants of Health   Financial Resource Strain: Not on file  Food Insecurity: Not on file  Transportation Needs: Not on file  Physical Activity: Not on file  Stress: Not on file  Social Connections: Not on file  Intimate Partner Violence: Not on file    Review of Systems  All other systems reviewed and are negative.  PHYSICAL EXAMINATION:    BP 110/60    Pulse 91    Ht 5\' 5"  (1.651 m)    SpO2 98%    BMI 37.28 kg/m     General appearance: alert, cooperative and appears stated age  Pelvic: External genitalia:  no lesions              Urethra:  normal appearing urethra with no masses, tenderness or lesions              Bartholins and Skenes: normal                 Vagina: normal appearing vagina with normal color and discharge, no lesions              Cervix: no lesions.  IUD strings noted.                 Bimanual Exam:  Uterus:  normal size, contour, position, consistency, mobility, non-tender              Adnexa: no mass, fullness, tenderness     IUD removal.  Consent for removal.  Hibiclens prep.  Ring forceps used to grasp  strings, remove IUD, intact, shown to patient, and discarded.   Chaperone was present for exam:  , CMA  ASSESSMENT  Encounter for IUD removal.  AMA status.  Tobacco use.   PLAN  IUD removed.  She will monitor for return of cycles.  She understands that she no longer has pregnancy prevention.  We discussed AMA status and increased risk of HTN, gestational diabetes, and aneuploidy.  I recommend starting a PNV.  Smoking cessation recommended.  FU prn.    An After Visit Summary was printed and given to the patient.

## 2021-10-07 ENCOUNTER — Encounter: Payer: Self-pay | Admitting: Obstetrics and Gynecology

## 2021-10-07 ENCOUNTER — Other Ambulatory Visit: Payer: Self-pay

## 2021-10-07 ENCOUNTER — Ambulatory Visit: Payer: BC Managed Care – PPO | Admitting: Obstetrics and Gynecology

## 2021-10-07 VITALS — BP 110/60 | HR 91 | Ht 65.0 in

## 2021-10-07 DIAGNOSIS — Z30432 Encounter for removal of intrauterine contraceptive device: Secondary | ICD-10-CM

## 2021-12-12 ENCOUNTER — Encounter (HOSPITAL_COMMUNITY): Payer: Self-pay | Admitting: Emergency Medicine

## 2021-12-12 ENCOUNTER — Other Ambulatory Visit: Payer: Self-pay

## 2021-12-12 ENCOUNTER — Ambulatory Visit (HOSPITAL_COMMUNITY)
Admission: EM | Admit: 2021-12-12 | Discharge: 2021-12-12 | Disposition: A | Discharge status: Home / Self Care | Payer: BC Managed Care – PPO | Attending: Emergency Medicine | Admitting: Emergency Medicine

## 2021-12-12 DIAGNOSIS — H109 Unspecified conjunctivitis: Secondary | ICD-10-CM

## 2021-12-12 DIAGNOSIS — J0100 Acute maxillary sinusitis, unspecified: Secondary | ICD-10-CM | POA: Diagnosis not present

## 2021-12-12 MED ORDER — AZITHROMYCIN 250 MG PO TABS: 250.0000 mg | ORAL_TABLET | Freq: Every day | ORAL | 0 refills | Status: DC

## 2021-12-12 MED ORDER — POLYMYXIN B-TRIMETHOPRIM 10000-0.1 UNIT/ML-% OP SOLN: 1.0000 [drp] | OPHTHALMIC | 0 refills | Status: DC

## 2021-12-12 NOTE — ED Triage Notes (Signed)
Pt reports right eye drainage and redness beginning last night.  ?

## 2021-12-12 NOTE — ED Provider Notes (Signed)
?[facility] ? ? ? ?CSN: [id number] ?Arrival date & time: 12/12/21  [id number] ? ? ?  ? ?History   ?Chief Complaint ?Chief Complaint  ?Patient presents with  ? Conjunctivitis  ? ? ?HPI ?Laura Guerra is a 36 y.o. female.  ? ?Patient presents with right eye drainage and crusting, redness and swelling beginning last night.  No known sick contacts.  Endorses that she did do an eyewash with water while at work.  Denies blurred vision, floaters, itchiness. ? ?Patient concerned persistent nasal congestion and rhinorrhea for 1 week, endorses that symptoms are worsening.  Has attempted use of over-the-counter medications which have been ineffective.  Denies fever, chills, body aches, coughing, shortness of breath, wheezing, sore throat. ? ?Past Medical History:  ?Diagnosis Date  ? No pertinent past medical history   ? Seasonal depression ([place])   ? ? ?Patient Active Problem List  ? Diagnosis Date Noted  ? Angio-edema 01/25/2020  ? E. coli UTI (urinary tract infection) 01/25/2020  ? Urticaria 01/22/2020  ? 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 Allergic rhinitis 10/08/2015  ? Routine general medical examination at a health care facility 03/11/2015  ? Tobacco use disorder 02/28/2015  ? ? ?Past Surgical History:  ?Procedure Laterality Date  ? NO PAST SURGERIES    ? WISDOM TOOTH EXTRACTION    ? ? ?OB History   ? ? Gravida  ?2  ? Para  ?2  ? Term  ?2  ? Preterm  ?0  ? AB  ?0  ? Living  ?2  ?  ? ? SAB  ?0  ? IAB  ?0  ? Ectopic  ?0  ? Multiple  ?0  ? Live Births  ?2  ?   ?  ?  ? ? ? ?Home Medications   ? ?Prior to Admission medications   ?Medication Sig Start Date End Date Taking? Authorizing Provider  ?azithromycin (ZITHROMAX) 250 MG tablet Take 1 tablet (250 mg total) by mouth daily. Take first 2 tablets together, then 1 every day until finished. 12/12/21  Yes Elektra Wartman, Leitha Schuller, NP  ?trimethoprim-polymyxin b (POLYTRIM) ophthalmic solution Place 1 drop into the right eye every 4  (four) hours. 12/12/21  Yes Wanda Rideout, Leitha Schuller, NP  ?diphenhydrAMINE (BENADRYL) 25 MG tablet Take 2 tablets (50 mg total) by mouth every 6 (six) hours as needed for itching or allergies. 04/23/20   Domenic Moras, PA-C  ?levocetirizine (XYZAL) 5 MG tablet Take 1 tablet (5 mg total) by mouth every evening. 02/21/20   Janith Lima, MD  ?levonorgestrel (MIRENA) 20 MCG/24HR IUD 1 each by Intrauterine route once.    [provider]  ? ? ?Family History ?Family History  ?Problem Relation Age of Onset  ? Cancer Father   ?     unknown type.  deceased age 80.  ? Cancer Maternal Grandmother   ?     breast  ? Breast cancer Maternal Grandmother   ? Cancer Paternal Uncle   ?     unknown type.  ? ? ?Social History ?Social History  ? ?Tobacco Use  ? Smoking status: Every Day  ?  Packs/day: 1.00  ?  Years: 20.00  ?  Pack years: 20.00  ?  Types: Cigarettes  ? Smokeless tobacco: Never  ?Vaping Use  ? Vaping Use: Never used  ?Substance Use Topics  ? Alcohol use: No  ?  Comment: occasionally  ? Drug use: Not Currently  ?  Types: Marijuana  ? ? ? ?Allergies   ?Bee venom and Pennsaid [diclofenac sodium] ? ? ?Review of Systems ?Review of Systems  ?Constitutional: Negative.   ?HENT:  Positive for congestion and rhinorrhea. Negative for dental problem, drooling, ear discharge, ear pain, facial swelling, hearing loss, mouth sores, nosebleeds, postnasal drip, sinus pressure, sinus pain, sneezing, sore throat, tinnitus, trouble swallowing and voice change.   ?Eyes:  Positive for discharge and redness. Negative for photophobia, pain, itching and visual disturbance.  ?Respiratory: Negative.    ?Cardiovascular: Negative.   ?Gastrointestinal: Negative.   ? ? ?Physical Exam ?Triage Vital Signs ?ED Triage Vitals  ?Enc Vitals Group  ?   BP 12/12/21 0940 124/85  ?   Pulse Rate 12/12/21 0940 72  ?   Resp 12/12/21 0940 16  ?   Temp 12/12/21 0940 98 ?F (36.7 ?C)  ?   Temp Source 12/12/21 0940 Oral  ?   SpO2 12/12/21 0940 95 %  ?   Weight 12/12/21  0939 223 lb 15.8 oz (101.6 kg)  ?   Height 12/12/21 0939 5\' 5"  (1.651 m)  ?   Head Circumference --   ?   Peak Flow --   ?   Pain Score 12/12/21 0937 8  ?   Pain Loc --   ?   Pain Edu? --   ?   Excl. in [place]? --   ? ?No data found. ? ?Updated Vital Signs ?BP 124/85 (BP Location: Right Arm)   Pulse 72   Temp 98 ?F (36.7 ?C) (Oral)   Resp 16   Ht 5\' 5"  (1.651 m)   Wt 223 lb 15.8 oz (101.6 kg)   SpO2 95%   BMI 37.27 kg/m?  ? ?Visual Acuity ?Right Eye Distance:   ?Left Eye Distance:   ?Bilateral Distance:   ? ?Right Eye Near:   ?Left Eye Near:    ?Bilateral Near:    ? ?Physical Exam ?Constitutional:   ?   Appearance: Normal appearance.  ?HENT:  ?   Head: Normocephalic.  ?   Right Ear: Tympanic membrane, ear canal and external ear normal.  ?   Left Ear: Tympanic membrane, ear canal and external ear normal.  ?   Nose: Congestion and rhinorrhea present.  ?   Mouth/Throat:  ?   Mouth: Mucous membranes are moist.  ?   Pharynx: Oropharynx is clear.  ?Eyes:  ?   Comments: Moderate periorbital right eye swelling, erythema noted to the right conjunctiva ,yellow drainage noted to the upper eyelash patient, vision grossly intact, extraocular movements intact  ?Pulmonary:  ?   Effort: Pulmonary effort is normal.  ?Neurological:  ?   Mental Status: She is alert and oriented to person, place, and time. Mental status is at baseline.  ?Psychiatric:     ?   Mood and Affect: Mood normal.     ?   Behavior: Behavior normal.  ? ? ? ?UC Treatments / Results  ?Labs ?(all labs ordered are listed, but only abnormal results are displayed) ?Labs Reviewed - No data to display ? ?EKG ? ? ?Radiology ?No results found. ? ?Procedures ?Procedures (including critical care time) ? ?Medications Ordered in UC ?Medications - No data to display ? ?Initial Impression / Assessment and Plan / UC Course  ?I have reviewed the triage vital signs and the nursing notes. ? ?Pertinent labs & imaging results that were available during my care of the patient were  reviewed by me and considered in my  medical decision making (see chart for details). ? ?Conjunctivitis of right eye ?Acute nonrecurrent maxillary sinusitis ? ?Polytrim 7-day course prescribed, recommended antihistamines if itching occurs, recommended cool compresses or napkins to remove drainage and avoidance of touching to prevent spread, patient does not wear contact, encourage continued use of glasses, advised to dispose of of any eye make-up recently used, may follow-up in urgent care as needed for persisting symptoms, will trial use of a Z-Pak for management of congestion as patient endorses that he is worsening, if nasal congestion persists, recommended use of over-the-counter antihistamine and Mucinex, urgent care follow-up as needed, work note given ?Final Clinical Impressions(s) / UC Diagnoses  ? ?Final diagnoses:  ?Bacterial conjunctivitis of right eye  ?Acute non-recurrent maxillary sinusitis  ? ? ? ?Discharge Instructions   ? ?  ?Today you being treated for bacterial conjunctivitis.  ? ?Place one drop of polytrim into the effected eye every 4 hours while awake for 7 days. If the other eye starts to have symptoms you may use medication in it as well. Do not allow tip of dropper to touch eye. ?May use cool compress for comfort and to remove discharge if present. Pat the eye, do not wipe. ? ?Do not rub eyes, this may cause more irritation. ? ?May use benadryl as needed to help if itching present. ? ?Please avoid use of eye makeup until symptoms clear. ? ?If symptoms persist after use of medication, please follow up at Urgent Care or with ophthalmologist (eye doctor)  ? ? ?ED Prescriptions   ? ? Medication Sig Dispense Auth. Provider  ? trimethoprim-polymyxin b (POLYTRIM) ophthalmic solution Place 1 drop into the right eye every 4 (four) hours. 10 mL Lowella Petties R, NP  ? azithromycin (ZITHROMAX) 250 MG tablet Take 1 tablet (250 mg total) by mouth daily. Take first 2 tablets together, then 1 every day  until finished. 6 tablet Hans Eden, NP  ? ?  ? ?PDMP not reviewed this encounter. ?  ?Hans Eden, NP ?12/12/21 1049 ? ?

## 2021-12-12 NOTE — Discharge Instructions (Signed)
Today you being treated for bacterial conjunctivitis.   Place one drop of polytrim into the effected eye every 4 hours while awake for 7 days. If the other eye starts to have symptoms you may use medication in it as well. Do not allow tip of dropper to touch eye. May use cool compress for comfort and to remove discharge if present. Pat the eye, do not wipe.  Do not rub eyes, this may cause more irritation.  May use benadryl as needed to help if itching present.  Please avoid use of eye makeup until symptoms clear.  If symptoms persist after use of medication, please follow up at Urgent Care or with ophthalmologist (eye doctor)  

## 2022-01-27 ENCOUNTER — Encounter: Payer: Self-pay | Admitting: Internal Medicine

## 2022-01-27 ENCOUNTER — Ambulatory Visit: Payer: BC Managed Care – PPO | Admitting: Internal Medicine

## 2022-01-27 VITALS — BP 122/70 | HR 80 | Resp 18 | Ht 65.0 in | Wt 232.0 lb

## 2022-01-27 DIAGNOSIS — R350 Frequency of micturition: Secondary | ICD-10-CM | POA: Diagnosis not present

## 2022-01-27 DIAGNOSIS — B351 Tinea unguium: Secondary | ICD-10-CM | POA: Diagnosis not present

## 2022-01-27 LAB — POCT URINALYSIS DIPSTICK
Bilirubin, UA: NEGATIVE
Glucose, UA: NEGATIVE
Ketones, UA: NEGATIVE
Leukocytes, UA: NEGATIVE
Nitrite, UA: NEGATIVE
Protein, UA: NEGATIVE
Spec Grav, UA: 1.02 (ref 1.010–1.025)
Urobilinogen, UA: 0.2 E.U./dL
pH, UA: 6 (ref 5.0–8.0)

## 2022-01-27 MED ORDER — FLUCONAZOLE 150 MG PO TABS: 150.0000 mg | ORAL_TABLET | ORAL | 0 refills | Status: DC

## 2022-01-27 MED ORDER — SULFAMETHOXAZOLE-TRIMETHOPRIM 800-160 MG PO TABS
1.0000 | ORAL_TABLET | Freq: Two times a day (BID) | ORAL | 0 refills | Status: DC
Start: 2022-01-27 — End: 2022-03-30

## 2022-01-27 MED ORDER — TERBINAFINE HCL 250 MG PO TABS: ORAL_TABLET | ORAL | 0 refills | Status: DC

## 2022-01-27 NOTE — Progress Notes (Signed)
? ?  Subjective:  ? ?Patient ID: Laura Guerra, female    DOB: 09/07/1986, 36 y.o.   MRN: [id number] ? ?HPI ?The patient is a 36 YO female coming in for possible UTI and toenail fungus ? ?Review of Systems  ?Constitutional: Negative.   ?Respiratory: Negative.    ?Cardiovascular: Negative.   ?Gastrointestinal:  Negative for abdominal distention, abdominal pain, constipation, diarrhea, nausea and vomiting.  ?Genitourinary:  Positive for dysuria and urgency. Negative for frequency.  ?Musculoskeletal: Negative.   ?Skin:  Positive for color change.  ? ?Objective:  ?Physical Exam ?Constitutional:   ?   Appearance: She is well-developed.  ?HENT:  ?   Head: Normocephalic and atraumatic.  ?Cardiovascular:  ?   Rate and Rhythm: Normal rate and regular rhythm.  ?Pulmonary:  ?   Effort: Pulmonary effort is normal. No respiratory distress.  ?   Breath sounds: Normal breath sounds. No wheezing or rales.  ?Abdominal:  ?   General: Bowel sounds are normal. There is no distension.  ?   Palpations: Abdomen is soft.  ?   Tenderness: There is no abdominal tenderness. There is no rebound.  ?Musculoskeletal:  ?   Cervical back: Normal range of motion.  ?Skin: ?   General: Skin is warm and dry.  ?   Comments: Right foot with 2 nails consistent toenail fungus  ?Neurological:  ?   Mental Status: She is alert and oriented to person, place, and time.  ?   Coordination: Coordination normal.  ? ? ?Vitals:  ? 01/27/22 1048  ?BP: 122/70  ?Pulse: 80  ?Resp: 18  ?SpO2: 97%  ?Weight: 232 lb (105.2 kg)  ?Height: 5\' 5"  (1.651 m)  ? ? ?This visit occurred during the SARS-CoV-2 public health emergency.  Safety protocols were in place, including screening questions prior to the visit, additional usage of staff PPE, and extensive cleaning of exam room while observing appropriate contact time as indicated for disinfecting solutions.  ? ?Assessment & Plan:  ? ?

## 2022-01-27 NOTE — Assessment & Plan Note (Signed)
Rx lamisil take 1 pill daily for 1 week, then skip 3 weeks, then take 1 pill daily for 1 week, then skip 3 weeks, then take 1 pill daily for 1 week to complete course. New normal nail can take 6 months to grow back to assess if treatment is successful.  ?

## 2022-01-27 NOTE — Assessment & Plan Note (Signed)
POC U/A done in office, rx for bactrim 5 day course. Rx diflucan as she typically gets yeast infection with antibiotics.  ?

## 2022-01-27 NOTE — Patient Instructions (Signed)
We have sent in bactrim to take 1 pill twice a day for 5 days. ? ?We have sent in the toenail fungus medicine lamisil to take 1 pill daily for 1 week, then skip 3 weeks, then take 1 pill daily for 1 week, then skip 3 weeks, then take 1 pill daily for 1 week then stop. ? ? ?

## 2022-03-30 ENCOUNTER — Telehealth: Payer: BC Managed Care – PPO | Admitting: Physician Assistant

## 2022-03-30 DIAGNOSIS — T3695XA Adverse effect of unspecified systemic antibiotic, initial encounter: Secondary | ICD-10-CM

## 2022-03-30 DIAGNOSIS — N760 Acute vaginitis: Secondary | ICD-10-CM | POA: Diagnosis not present

## 2022-03-30 DIAGNOSIS — B379 Candidiasis, unspecified: Secondary | ICD-10-CM | POA: Diagnosis not present

## 2022-03-30 DIAGNOSIS — B9689 Other specified bacterial agents as the cause of diseases classified elsewhere: Secondary | ICD-10-CM

## 2022-03-30 MED ORDER — FLUCONAZOLE 150 MG PO TABS: 150.0000 mg | ORAL_TABLET | ORAL | 0 refills | Status: DC | PRN

## 2022-03-30 MED ORDER — METRONIDAZOLE 500 MG PO TABS: 500.0000 mg | ORAL_TABLET | Freq: Two times a day (BID) | ORAL | 0 refills | Status: AC

## 2022-03-30 NOTE — Progress Notes (Signed)
E-Visit for Vaginal Symptoms  We are sorry that you are not feeling well. Here is how we plan to help! Based on what you shared with me it looks like you: May have a vaginosis due to bacteria  Vaginosis is an inflammation of the vagina that can result in discharge, itching and pain. The cause is usually a change in the normal balance of vaginal bacteria or an infection. Vaginosis can also result from reduced estrogen levels after menopause.  The most common causes of vaginosis are:   Bacterial vaginosis which results from an overgrowth of one on several organisms that are normally present in your vagina.   Yeast infections which are caused by a naturally occurring fungus called candida.   Vaginal atrophy (atrophic vaginosis) which results from the thinning of the vagina from reduced estrogen levels after menopause.   Trichomoniasis which is caused by a parasite and is commonly transmitted by sexual intercourse.  Factors that increase your risk of developing vaginosis include: Medications, such as antibiotics and steroids Uncontrolled diabetes Use of hygiene products such as bubble bath, vaginal spray or vaginal deodorant Douching Wearing damp or tight-fitting clothing Using an intrauterine device (IUD) for birth control Hormonal changes, such as those associated with pregnancy, birth control pills or menopause Sexual activity Having a sexually transmitted infection  Your treatment plan is Metronidazole or Flagyl 500mg  twice a day for 7 days.  I have electronically sent this prescription into the pharmacy that you have chosen.  I have also prescribed Diflucan for yeast infection.   Be sure to take all of the medication as directed. Stop taking any medication if you develop a rash, tongue swelling or shortness of breath. Mothers who are breast feeding should consider pumping and discarding their breast milk while on these antibiotics. However, there is no consensus that infant exposure  at these doses would be harmful.  Remember that medication creams can weaken latex condoms.   HOME CARE:  Good hygiene may prevent some types of vaginosis from recurring and may relieve some symptoms:  Avoid baths, hot tubs and whirlpool spas. Rinse soap from your outer genital area after a shower, and dry the area well to prevent irritation. Don't use scented or harsh soaps, such as those with deodorant or antibacterial action. Avoid irritants. These include scented tampons and pads. Wipe from front to back after using the toilet. Doing so avoids spreading fecal bacteria to your vagina.  Other things that may help prevent vaginosis include:  Don't douche. Your vagina doesn't require cleansing other than normal bathing. Repetitive douching disrupts the normal organisms that reside in the vagina and can actually increase your risk of vaginal infection. Douching won't clear up a vaginal infection. Use a latex condom. Both female and female latex condoms may help you avoid infections spread by sexual contact. Wear cotton underwear. Also wear pantyhose with a cotton crotch. If you feel comfortable without it, skip wearing underwear to bed. Yeast thrives in [place] Your symptoms should improve in the next day or two.  GET HELP RIGHT AWAY IF:  You have pain in your lower abdomen ( pelvic area or over your ovaries) You develop nausea or vomiting You develop a fever Your discharge changes or worsens You have persistent pain with intercourse You develop shortness of breath, a rapid pulse, or you faint.  These symptoms could be signs of problems or infections that need to be evaluated by a medical provider now.  MAKE SURE YOU   Understand  these instructions. Will watch your condition. Will get help right away if you are not doing well or get worse.  Thank you for choosing an e-visit.  Your e-visit answers were reviewed by a board certified advanced clinical practitioner to  complete your personal care plan. Depending upon the condition, your plan could have included both over the counter or prescription medications.  Please review your pharmacy choice. Make sure the pharmacy is open so you can pick up prescription now. If there is a problem, you may contact your provider through [employer] and have the prescription routed to another pharmacy.  Your safety is important to [place]. If you have drug allergies check your prescription carefully.   For the next 24 hours you can use MyChart to ask questions about today's visit, request a non-urgent call back, or ask for a work or school excuse. You will get an email in the next two days asking about your experience. I hope that your e-visit has been valuable and will speed your recovery.  I provided 5 minutes of non face-to-face time during this encounter for chart review and documentation.

## 2022-04-24 ENCOUNTER — Telehealth: Payer: BC Managed Care – PPO | Admitting: Emergency Medicine

## 2022-04-24 DIAGNOSIS — J329 Chronic sinusitis, unspecified: Secondary | ICD-10-CM | POA: Diagnosis not present

## 2022-04-24 MED ORDER — AMOXICILLIN-POT CLAVULANATE 875-125 MG PO TABS: 1.0000 | ORAL_TABLET | Freq: Two times a day (BID) | ORAL | 0 refills | Status: DC

## 2022-04-24 NOTE — Progress Notes (Signed)
E-Visit for Sinus Problems  Based on the length of symptoms and now the eye discharge, you might be working on an infection rather than allergies at this point.  I want to start you on an antibiotic that should get you cleared up if you are starting an infection.  This should help with the eye discharge too.  Continue taking OTC allergy meds.   We are sorry that you are not feeling well.  Here is how we plan to help!  Based on what you have shared with me it looks like you have sinusitis.  Sinusitis is inflammation and infection in the sinus cavities of the head.  Based on your presentation I believe you most likely have Acute Bacterial Sinusitis.  This is an infection caused by bacteria and is treated with antibiotics. I have prescribed Augmentin 875mg /125mg  one tablet twice daily with food, for 7 days. You may use an oral decongestant such as Mucinex D or if you have glaucoma or high blood pressure use plain Mucinex. Saline nasal spray help and can safely be used as often as needed for congestion.  If you develop worsening sinus pain, fever or notice severe headache and vision changes, or if symptoms are not better after completion of antibiotic, please schedule an appointment with a health care provider.    Sinus infections are not as easily transmitted as other respiratory infection, however we still recommend that you avoid close contact with loved ones, especially the very young and elderly.  Remember to wash your hands thoroughly throughout the day as this is the number one way to prevent the spread of infection!  Home Care: Only take medications as instructed by your medical team. Complete the entire course of an antibiotic. Do not take these medications with alcohol. A steam or ultrasonic humidifier can help congestion.  You can place a towel over your head and breathe in the steam from hot water coming from a faucet. Avoid close contacts especially the very young and the elderly. Cover  your mouth when you cough or sneeze. Always remember to wash your hands.  Get Help Right Away If: You develop worsening fever or sinus pain. You develop a severe head ache or visual changes. Your symptoms persist after you have completed your treatment plan.  Make sure you Understand these instructions. Will watch your condition. Will get help right away if you are not doing well or get worse.  Thank you for choosing an e-visit.  Your e-visit answers were reviewed by a board certified advanced clinical practitioner to complete your personal care plan. Depending upon the condition, your plan could have included both over the counter or prescription medications.  Please review your pharmacy choice. Make sure the pharmacy is open so you can pick up prescription now. If there is a problem, you may contact your provider through and have the prescription routed to another pharmacy.  Your safety is important to [employer]. If you have drug allergies check your prescription carefully.   For the next 24 hours you can use MyChart to ask questions about today's visit, request a non-urgent call back, or ask for a work or school excuse. You will get an email in the next two days asking about your experience. I hope that your e-visit has been valuable and will speed your recovery.  Approximately 5 minutes was used in reviewing the patient's chart, questionnaire, prescribing medications, and documentation.

## 2022-05-12 ENCOUNTER — Telehealth: Payer: BC Managed Care – PPO | Admitting: Physician Assistant

## 2022-05-12 DIAGNOSIS — J0191 Acute recurrent sinusitis, unspecified: Secondary | ICD-10-CM

## 2022-05-12 NOTE — Progress Notes (Signed)
Because you have already been appropriately treated with a course of antibiotics and are still having symptoms, I feel your condition warrants further evaluation and I recommend that you be seen in a face to face visit.   NOTE: There will be NO CHARGE for this eVisit   If you are having a true medical emergency please call 911.      For an urgent face to face visit, [facility] has seven urgent care centers for your convenience:     [facility] at [facility] Directions [phone] [address] [place], [place] [address]    [facility] Center [facility]) Get Driving Directions [phone] [address] [place], [place] [address]  [facility] Center [facility]) Get Driving Directions [phone] [address] [place],  [place]  [address]  [facility] at [employer] Directions  [phone] [address] [place],  [place] [address]   [facility] Health Urgent Care at [facility] Get Driving Directions [phone] [address] [place], [place] [address]   [facility] Health Urgent Care at [facility] Get Driving Directions  [phone] [address] [place], [place] [address]   [facility] Health Urgent Care at [facility] Directions [phone] [address] [place], [place] [address]  Your MyChart E-visit questionnaire answers were reviewed by a board certified advanced clinical practitioner to complete your personal care plan based on your specific symptoms.  Thank you for using e-Visits.   Approximately 5 minutes was spent documenting and reviewing patient's chart.

## 2022-07-31 ENCOUNTER — Emergency Department (HOSPITAL_COMMUNITY)
Admission: EM | Admit: 2022-07-31 | Discharge: 2022-07-31 | Disposition: A | Discharge status: Home / Self Care | Payer: BC Managed Care – PPO | Attending: Emergency Medicine | Admitting: Emergency Medicine

## 2022-07-31 ENCOUNTER — Emergency Department (HOSPITAL_COMMUNITY): Payer: BC Managed Care – PPO

## 2022-07-31 ENCOUNTER — Encounter (HOSPITAL_COMMUNITY): Payer: Self-pay

## 2022-07-31 ENCOUNTER — Other Ambulatory Visit: Payer: Self-pay

## 2022-07-31 DIAGNOSIS — R109 Unspecified abdominal pain: Secondary | ICD-10-CM | POA: Diagnosis present

## 2022-07-31 DIAGNOSIS — R10A2 Flank pain, left side: Secondary | ICD-10-CM

## 2022-07-31 LAB — CBC WITH DIFFERENTIAL/PLATELET
Abs Immature Granulocytes: 0.02 10*3/uL (ref 0.00–0.07)
Basophils Absolute: 0.1 10*3/uL (ref 0.0–0.1)
Basophils Relative: 1 %
Eosinophils Absolute: 0.3 10*3/uL (ref 0.0–0.5)
Eosinophils Relative: 4 %
HCT: 47.4 % — ABNORMAL HIGH (ref 36.0–46.0)
Hemoglobin: 16.6 g/dL — ABNORMAL HIGH (ref 12.0–15.0)
Immature Granulocytes: 0 %
Lymphocytes Relative: 37 %
Lymphs Abs: 2.7 10*3/uL (ref 0.7–4.0)
MCH: 31.3 pg (ref 26.0–34.0)
MCHC: 35 g/dL (ref 30.0–36.0)
MCV: 89.4 fL (ref 80.0–100.0)
Monocytes Absolute: 0.5 10*3/uL (ref 0.1–1.0)
Monocytes Relative: 7 %
Neutro Abs: 3.7 10*3/uL (ref 1.7–7.7)
Neutrophils Relative %: 51 %
Platelets: 245 10*3/uL (ref 150–400)
RBC: 5.3 MIL/uL — ABNORMAL HIGH (ref 3.87–5.11)
RDW: 12 % (ref 11.5–15.5)
WBC: 7.3 10*3/uL (ref 4.0–10.5)
nRBC: 0 % (ref 0.0–0.2)

## 2022-07-31 LAB — URINALYSIS, ROUTINE W REFLEX MICROSCOPIC
Bacteria, UA: NONE SEEN
Bilirubin Urine: NEGATIVE
Glucose, UA: NEGATIVE mg/dL
Ketones, ur: NEGATIVE mg/dL
Leukocytes,Ua: NEGATIVE
Nitrite: NEGATIVE
Protein, ur: NEGATIVE mg/dL
Specific Gravity, Urine: 1.017 (ref 1.005–1.030)
pH: 5 (ref 5.0–8.0)

## 2022-07-31 LAB — COMPREHENSIVE METABOLIC PANEL
ALT: 35 U/L (ref 0–44)
AST: 27 U/L (ref 15–41)
Albumin: 4.4 g/dL (ref 3.5–5.0)
Alkaline Phosphatase: 44 U/L (ref 38–126)
Anion gap: 8 (ref 5–15)
BUN: 12 mg/dL (ref 6–20)
CO2: 28 mmol/L (ref 22–32)
Calcium: 9.5 mg/dL (ref 8.9–10.3)
Chloride: 103 mmol/L (ref 98–111)
Creatinine, Ser: 0.76 mg/dL (ref 0.44–1.00)
GFR, Estimated: >60 mL/min (ref >60)
Glucose, Bld: 117 mg/dL — ABNORMAL HIGH (ref 70–99)
Potassium: 4.1 mmol/L (ref 3.5–5.1)
Sodium: 139 mmol/L (ref 135–145)
Total Bilirubin: 1.4 mg/dL — ABNORMAL HIGH (ref 0.3–1.2)
Total Protein: 6.6 g/dL (ref 6.5–8.1)

## 2022-07-31 LAB — POC URINE PREG, ED: Preg Test, Ur: NEGATIVE

## 2022-07-31 MED ORDER — KETOROLAC TROMETHAMINE 60 MG/2ML IM SOLN
30.0000 mg | Freq: Once | INTRAMUSCULAR | Status: DC
Start: 2022-07-31 — End: 2022-07-31
  Filled 2022-07-31: qty 2

## 2022-07-31 MED ORDER — ONDANSETRON 4 MG PO TBDP
4.0000 mg | ORAL_TABLET | Freq: Once | ORAL | Status: AC
  Administered 2022-07-31: 4 mg via ORAL
  Filled 2022-07-31: qty 1

## 2022-07-31 MED ORDER — ONDANSETRON 8 MG PO TBDP: 8.0000 mg | ORAL_TABLET | Freq: Three times a day (TID) | ORAL | 0 refills | Status: DC | PRN

## 2022-07-31 NOTE — ED Provider Triage Note (Signed)
Emergency Medicine Provider Triage Evaluation Note  Laura Guerra , a 36 y.o. female  was evaluated in triage.  Pt complains of L flank pain for the last few days associated with fatigue and nausea and vomiting. Reports urinary hesitancy and spasms.  Review of Systems  Positive: L flank pain  Negative: Fever, chills  Physical Exam  BP 113/84 (BP Location: Right Arm)   Pulse 79   Temp 98.7 F (37.1 C) (Oral)   Resp 18   Ht 5\' 5"  (1.651 m)   Wt 108.9 kg   SpO2 94%   BMI 39.94 kg/m  Gen:   Awake, no distress   Resp:  Normal effort  MSK:   Moves extremities without difficulty  Other:  Ttp of L flank, no CVA ttp  Medical Decision Making  Medically screening exam initiated at 1:35 PM.  Appropriate orders placed.  [name] was informed that the remainder of the evaluation will be completed by another provider, this initial triage assessment does not replace that evaluation, and the importance of remaining in the ED until their evaluation is complete.     Osvaldo Shipper, [place] 07/31/22 1344

## 2022-07-31 NOTE — ED Provider Notes (Signed)
[facility] EMERGENCY DEPARTMENT Provider Note   CSN: [id number] Arrival date & time: 07/31/22  1242     History  Chief Complaint  Patient presents with   Flank Pain    Laura Guerra is a 36 y.o. female.   Flank Pain     Patient presents due to left-sided flank pain x4 days.  Associated with nausea and 2 episodes of emesis but none today.  Denies any dysuria or appreciable hematuria but she did have some hematuria on UA in urgent care earlier today and was sent to ED to evaluate for possible kidney stone.  Pain is also to the left lower quadrant and radiates to the left flank, its intermittent.  Currently is asymptomatic.  She has been having intermittent headaches, feels fatigued despite sleeping 8 hours.  No recent changes in medication, Zofran helps with nausea.  Denies any previous abdominal surgeries.  Patient's menstrual cycle ended a few days ago.  Home Medications Prior to Admission medications   Medication Sig Start Date End Date Taking? Authorizing Provider  ondansetron (ZOFRAN-ODT) 8 MG disintegrating tablet Take 1 tablet (8 mg total) by mouth every 8 (eight) hours as needed for nausea or vomiting. 07/31/22  Yes Sherrill Raring, PA-C  amoxicillin-clavulanate (AUGMENTIN) 875-125 MG tablet Take 1 tablet by mouth 2 (two) times daily. Don't take if pregnant. 04/24/22   Montine Circle, PA-C  fluconazole (DIFLUCAN) 150 MG tablet Take 1 tablet (150 mg total) by mouth every 3 (three) days as needed. 03/30/22   Mar Daring, PA-C  terbinafine (LAMISIL) 250 MG tablet Take 1 pill daily for 1 week then skip 3 weeks for 3 months 01/27/22   Hoyt Koch, MD      Allergies    Bee venom and Pennsaid [diclofenac sodium]    Review of Systems   Review of Systems  Genitourinary:  Positive for flank pain.    Physical Exam Updated Vital Signs BP 130/86 (BP Location: Right Arm)   Pulse 73   Temp 98 F (36.7 C) (Oral)   Resp 18   Ht 5\' 5"  (1.651  m)   Wt 108.9 kg   SpO2 99%   BMI 39.94 kg/m  Physical Exam Vitals and nursing note reviewed. Exam conducted with a chaperone present.  Constitutional:      Appearance: Normal appearance.  HENT:     Head: Normocephalic and atraumatic.  Eyes:     General: No scleral icterus.       Right eye: No discharge.        Left eye: No discharge.     Extraocular Movements: Extraocular movements intact.     Pupils: Pupils are equal, round, and reactive to light.  Cardiovascular:     Rate and Rhythm: Normal rate and regular rhythm.     Pulses: Normal pulses.     Heart sounds: Normal heart sounds. No murmur heard.    No friction rub. No gallop.  Pulmonary:     Effort: Pulmonary effort is normal. No respiratory distress.     Breath sounds: Normal breath sounds.  Abdominal:     General: Abdomen is flat. Bowel sounds are normal. There is no distension.     Palpations: Abdomen is soft.     Tenderness: There is no abdominal tenderness. There is no left CVA tenderness.     Comments: Abdominal exam is completely benign for me.  No reproducible CVA tenderness or abdominal tenderness.  Skin:    General: Skin is warm  and dry.     Coloration: Skin is not jaundiced.  Neurological:     Mental Status: She is alert. Mental status is at baseline.     Coordination: Coordination normal.     ED Results / Procedures / Treatments   Labs (all labs ordered are listed, but only abnormal results are displayed) Labs Reviewed  CBC WITH DIFFERENTIAL/PLATELET - Abnormal; Notable for the following components:      Result Value   RBC 5.30 (*)    Hemoglobin 16.6 (*)    HCT 47.4 (*)    All other components within normal limits  COMPREHENSIVE METABOLIC PANEL - Abnormal; Notable for the following components:   Glucose, Bld 117 (*)    Total Bilirubin 1.4 (*)    All other components within normal limits  URINALYSIS, ROUTINE W REFLEX MICROSCOPIC - Abnormal; Notable for the following components:   APPearance HAZY  (*)    Hgb urine dipstick MODERATE (*)    All other components within normal limits  URINALYSIS, ROUTINE W REFLEX MICROSCOPIC  POC URINE PREG, ED    EKG None  Radiology CT Renal Stone Study  Result Date: 07/31/2022 CLINICAL DATA:  Flank pain, kidney stone suspected Left-sided pain. EXAM: CT ABDOMEN AND PELVIS WITHOUT CONTRAST TECHNIQUE: Multidetector CT imaging of the abdomen and pelvis was performed following the standard protocol without IV contrast. RADIATION DOSE REDUCTION: This exam was performed according to the departmental dose-optimization program which includes automated exposure control, adjustment of the mA and/or kV according to patient size and/or use of iterative reconstruction technique. COMPARISON:  None Available. FINDINGS: Lower chest: Subsegmental atelectasis in the lingula. No confluent consolidation or pleural effusion. Hepatobiliary: None diffuse hepatic steatosis. Focal fatty sparing adjacent to the gallbladder fossa. The liver is enlarged spanning 22.4 cm cranial caudal. Few small subcentimeter hypodensities in the liver are too small to accurately characterize, likely small cysts. Decompressed gallbladder. No pericholecystic inflammation. No biliary dilatation. Pancreas: No ductal dilatation or inflammation. Spleen: Normal in size without focal abnormality. Adrenals/Urinary Tract: Normal adrenal glands. No hydronephrosis. No perinephric edema. No renal calculi. Both ureters are decompressed without stones along the course. Minimally distended urinary bladder, no wall thickening or bladder stone. Stomach/Bowel: Stomach is within normal limits. Appendix appears normal. No evidence of bowel wall thickening, distention, or inflammatory changes. Small to moderate volume of stool in the colon. Vascular/Lymphatic: Normal caliber abdominal aorta. No portal venous or mesenteric gas. No bulky adenopathy. Reproductive: Uterus and bilateral adnexa are unremarkable. The ovaries are  symmetric in size. Other: No free air, free fluid, or intra-abdominal fluid collection. No significant abdominal wall hernia. Musculoskeletal: L5-S1 degenerative disc disease with Modic endplate changes. Posterior disc osteophyte complex at this level causes narrowing of the spinal canal. There are no acute or suspicious osseous abnormalities. IMPRESSION: 1. No renal stones or obstructive uropathy. 2. Hepatic steatosis and hepatomegaly. 3. L5-S1 degenerative disc disease with posterior disc osteophyte complex causing narrowing of the spinal canal. Electronically Signed   By: Keith Rake M.D.   On: 07/31/2022 15:33    Procedures Procedures    Medications Ordered in ED Medications  ondansetron (ZOFRAN-ODT) disintegrating tablet 4 mg (4 mg Oral Given 07/31/22 1530)    ED Course/ Medical Decision Making/ A&P                           Medical Decision Making Amount and/or Complexity of Data Reviewed Labs: ordered.  Risk Prescription drug management.  Patient presents due to left flank pain and lower abdominal tenderness.  Differential includes not limited to ruptured topic pregnancy, nephrolithiasis, pyelonephritis, UTI, MSK, diverticulitis, ovarian torsion, ovarian rupture.  On my physical exam patient has no reproducible tenderness.  She was having pain earlier today at the urgent care and in triage, she states her pain has resolved at this time.  She is nonseptic appearing with reassuring vital signs. -BP 130/86 (BP Location: Right Arm)   Pulse 73   Temp 98 F (36.7 C) (Oral)   Resp 18   Ht 5\' 5"  (1.651 m)   Wt 108.9 kg   SpO2 99%   BMI 39.94 kg/m   I ordered, viewed and interpreted laboratory work-up. CBC - No leukocytosis or anemia.   CMP is without gross electrolyte derangement, AKI or transaminitis.   Patient is not pregnant.   UA is pending.  Unfortunately the first urine was lost and UA was not obtained although urine pregnancy ran.  Second urine was collected pending  at this time.  CT renal is negative for any acute process.  No nephrolithiasis, no stranding that be suspicious for diverticulitis or pyelonephritis.  Patient was offered Toradol but declined.  Given Zofran for nausea and when I reevaluated her states that improved.    Patient is currently asymptomatic which is reassuring.  Her symptoms are somewhat atypical.  There is no gross electrolyte derangement or AKI.  No signs of diverticulitis, pyelonephritis or UTI on work-up.  Considered torsion but ultimately think it is less likely.  She does not have an ectopic given she is not pregnant.  Afebrile, no leukocytosis noted expect these to be higher if she was having TOA or an infectious etiology.  Overall low suspicion for that.    I think her symptoms are probably multifactorial.  She does states she is recently changed her diet to cut out meat, she also been doing juice cleanses and other changes.  I encouraged her to drink plenty of fluids and to keep a symptom diary.  She will follow-up with her primary for reevaluation on Monday.  Strict return precautions were discussed with the patient.  At this time I feel she is stable and appropriate for outpatient follow-up.        Final Clinical Impression(s) / ED Diagnoses Final diagnoses:  Left flank pain    Rx / DC Orders ED Discharge Orders          Ordered    ondansetron (ZOFRAN-ODT) 8 MG disintegrating tablet  Every 8 hours PRN        07/31/22 1805              Sherrill Raring, PA-C 07/31/22 1844    Tretha Sciara, MD 07/31/22 1900

## 2022-07-31 NOTE — ED Triage Notes (Signed)
Pt arrived POV from urgent care c/o left sided flank pain and N/V x4 days. Pt states she thought she had a UTI but urgent care wants a CT and thinks it could be a kidney stone.

## 2022-07-31 NOTE — Discharge Instructions (Addendum)
Work-up today was very reassuring overall.  There is no signs of kidney injury, your electrolytes are normal.  I would continue drinking fluids.  Keep a diary of all your symptoms in your diet and see if anything relates.  Follow-up with your primary for reevaluation on Monday.  Return to the ED if you have new or concerning symptoms.  Take Zofran every 8 hours as needed for nausea.   Narrative  CLINICAL DATA:  Flank pain, kidney stone suspected    Left-sided pain.    EXAM:  CT ABDOMEN AND PELVIS WITHOUT CONTRAST    TECHNIQUE:  Multidetector CT imaging of the abdomen and pelvis was performed  following the standard protocol without IV contrast.    RADIATION DOSE REDUCTION: This exam was performed according to the  departmental dose-optimization program which includes automated  exposure control, adjustment of the mA and/or kV according to  patient size and/or use of iterative reconstruction technique.    COMPARISON:  None Available.    FINDINGS:  Lower chest: Subsegmental atelectasis in the lingula. No confluent  consolidation or pleural effusion.    Hepatobiliary: None diffuse hepatic steatosis. Focal fatty sparing  adjacent to the gallbladder fossa. The liver is enlarged spanning  22.4 cm cranial caudal. Few small subcentimeter hypodensities in the  liver are too small to accurately characterize, likely small cysts.  Decompressed gallbladder. No pericholecystic inflammation. No  biliary dilatation.    Pancreas: No ductal dilatation or inflammation.    Spleen: Normal in size without focal abnormality.    Adrenals/Urinary Tract: Normal adrenal glands. No hydronephrosis. No  perinephric edema. No renal calculi. Both ureters are decompressed  without stones along the course. Minimally distended urinary  bladder, no wall thickening or bladder stone.    Stomach/Bowel: Stomach is within normal limits. Appendix appears  normal. No evidence of bowel wall thickening, distention, or   inflammatory changes. Small to moderate volume of stool in the  colon.    Vascular/Lymphatic: Normal caliber abdominal aorta. No portal venous  or mesenteric gas. No bulky adenopathy.    Reproductive: Uterus and bilateral adnexa are unremarkable. The  ovaries are symmetric in size.    Other: No free air, free fluid, or intra-abdominal fluid collection.  No significant abdominal wall hernia.    Musculoskeletal: L5-S1 degenerative disc disease with Modic endplate  changes. Posterior disc osteophyte complex at this level causes  narrowing of the spinal canal. There are no acute or suspicious  osseous abnormalities.    IMPRESSION:  1. No renal stones or obstructive uropathy.  2. Hepatic steatosis and hepatomegaly.  3. L5-S1 degenerative disc disease with posterior disc osteophyte  complex causing narrowing of the spinal canal.

## 2022-12-21 ENCOUNTER — Other Ambulatory Visit: Payer: Self-pay

## 2022-12-21 ENCOUNTER — Encounter (HOSPITAL_COMMUNITY): Payer: Self-pay | Admitting: Emergency Medicine

## 2022-12-21 ENCOUNTER — Emergency Department (HOSPITAL_COMMUNITY)
Admission: EM | Admit: 2022-12-21 | Discharge: 2022-12-22 | Disposition: A | Discharge status: Home / Self Care | Payer: BC Managed Care – PPO | Attending: Emergency Medicine | Admitting: Emergency Medicine

## 2022-12-21 DIAGNOSIS — W260XXA Contact with knife, initial encounter: Secondary | ICD-10-CM | POA: Insufficient documentation

## 2022-12-21 DIAGNOSIS — Y93G9 Activity, other involving cooking and grilling: Secondary | ICD-10-CM | POA: Diagnosis not present

## 2022-12-21 DIAGNOSIS — Z23 Encounter for immunization: Secondary | ICD-10-CM | POA: Insufficient documentation

## 2022-12-21 DIAGNOSIS — Y92000 Kitchen of unspecified non-institutional (private) residence as  the place of occurrence of the external cause: Secondary | ICD-10-CM | POA: Insufficient documentation

## 2022-12-21 DIAGNOSIS — S6992XA Unspecified injury of left wrist, hand and finger(s), initial encounter: Secondary | ICD-10-CM | POA: Diagnosis present

## 2022-12-21 DIAGNOSIS — S61217A Laceration without foreign body of left little finger without damage to nail, initial encounter: Secondary | ICD-10-CM | POA: Diagnosis not present

## 2022-12-21 MED ORDER — TETANUS-DIPHTH-ACELL PERTUSSIS 5-2.5-18.5 LF-MCG/0.5 IM SUSY
0.5000 mL | PREFILLED_SYRINGE | Freq: Once | INTRAMUSCULAR | Status: AC
  Administered 2022-12-21: 0.5 mL via INTRAMUSCULAR
  Filled 2022-12-21: qty 0.5

## 2022-12-21 NOTE — ED Triage Notes (Signed)
Laceration noted to left pinky finger from knife while prepping diner. Motor and sensation intact. Bleeding controlled.

## 2022-12-21 NOTE — ED Provider Triage Note (Signed)
Emergency Medicine Provider Triage Evaluation Note  SHADAI REDDISH , a 37 y.o. female  was evaluated in triage.  Pt complains of left pink finger laceration onset PTA. Notes that she was cutting a block of cheese when she accidentally cut her pinky finger. Unsure of tetatnus status. Denies swelling.   Review of Systems  Positive:  Negative:   Physical Exam  BP (!) 116/94 (BP Location: Right Arm)   Pulse 82   Temp 98.4 F (36.9 C) (Oral)   Resp 17   SpO2 98%  Gen:   Awake, no distress   Resp:  Normal effort  MSK:   Moves extremities without difficulty  Other:  1 cm laceration noted to distal phalanx of left fifth digit. Able to flex and extend against resistance.   Medical Decision Making  Medically screening exam initiated at 7:29 PM.  Appropriate orders placed.  Karlton Lemon Coco was informed that the remainder of the evaluation will be completed by another provider, this initial triage assessment does not replace that evaluation, and the importance of remaining in the ED until their evaluation is complete.  Work-up initiated.    Kailah Pennel A, PA-C 12/21/22 1931

## 2022-12-22 MED ORDER — LIDOCAINE HCL 2 % IJ SOLN
15.0000 mL | Freq: Once | INTRAMUSCULAR | Status: AC
  Administered 2022-12-22: 300 mg
  Filled 2022-12-22: qty 20

## 2022-12-22 NOTE — Discharge Instructions (Signed)
Your stitches need to be removed in 7 days.  You may do this with a primary care, and urgent care or at an emergency department.  Do not use Neosporin however you may use things like Vaseline to moisturize your hands  Do not hesitate to return to the emergency department with any signs of infection such as yellow drainage, fevers, chills or any other concerns.  You may use Tylenol and ibuprofen for your discomfort.  It was a pleasure to meet you and we hope you feel better.  Your work note is attached.

## 2022-12-22 NOTE — ED Provider Notes (Signed)
[facility] Provider Note   CSN: [id number] Arrival date & time: 12/21/22  1921     History  Chief Complaint  Patient presents with   Laceration    Laura Guerra is a 37 y.o. female presenting today with a laceration to the left pinky.  She reports she was cutting food with a kitchen knife and cut her finger.  Unknown last tetanus.  No numbness or tingling or limitations in range of motion   Laceration      Home Medications Prior to Admission medications   Medication Sig Start Date End Date Taking? Authorizing Provider  amoxicillin-clavulanate (AUGMENTIN) 875-125 MG tablet Take 1 tablet by mouth 2 (two) times daily. Don't take if pregnant. 04/24/22   Montine Circle, PA-C  fluconazole (DIFLUCAN) 150 MG tablet Take 1 tablet (150 mg total) by mouth every 3 (three) days as needed. 03/30/22   Mar Daring, PA-C  ondansetron (ZOFRAN-ODT) 8 MG disintegrating tablet Take 1 tablet (8 mg total) by mouth every 8 (eight) hours as needed for nausea or vomiting. 07/31/22   Sherrill Raring, PA-C  terbinafine (LAMISIL) 250 MG tablet Take 1 pill daily for 1 week then skip 3 weeks for 3 months 01/27/22   Hoyt Koch, MD      Allergies    Bee venom and Pennsaid [diclofenac sodium]    Review of Systems   Review of Systems  Physical Exam Updated Vital Signs BP 125/86   Pulse 67   Temp 98.3 F (36.8 C) (Oral)   Resp 17   Ht 5\' 5"  (1.651 m)   Wt 108.9 kg   SpO2 97%   BMI 39.94 kg/m  Physical Exam Vitals and nursing note reviewed.  Constitutional:      Appearance: Normal appearance.  HENT:     Head: Normocephalic and atraumatic.  Eyes:     General: No scleral icterus.    Conjunctiva/sclera: Conjunctivae normal.  Pulmonary:     Effort: Pulmonary effort is normal. No respiratory distress.  Musculoskeletal:     Comments: Full range of motion at the MCP, PIP and DIP.  Normal cap refill.  No exposed vasculature or tendons   Skin:    Findings: No rash.     Comments: 1.5cm volar surface of 5th digit of left hand  Neurological:     Mental Status: She is alert.  Psychiatric:        Mood and Affect: Mood normal.     ED Results / Procedures / Treatments   Labs (all labs ordered are listed, but only abnormal results are displayed) Labs Reviewed - No data to display  EKG None  Radiology No results found.  Procedures .Marland KitchenLaceration Repair  Date/Time: 12/22/2022 2:55 AM  Performed by: Rhae Hammock, PA-C Authorized by: Rhae Hammock, PA-C   Consent:    Consent obtained:  Verbal   Consent given by:  Patient   Risks discussed:  Pain, infection, retained foreign body and need for additional repair   Alternatives discussed:  No treatment Universal protocol:    Patient identity confirmed:  Verbally with patient Anesthesia:    Anesthesia method:  Nerve block Laceration details:    Location:  Finger   Length (cm):  1.5 Exploration:    Hemostasis achieved with:  Direct pressure   Imaging outcome: foreign body not noted     Wound extent: no foreign body     Contaminated: no   Treatment:    Area  cleansed with:  Povidone-iodine Skin repair:    Repair method:  Sutures   Suture size:  5-0   Suture material:  Prolene   Suture technique:  Simple interrupted   Number of sutures:  7 Approximation:    Approximation:  Close Repair type:    Repair type:  Simple Post-procedure details:    Dressing:  Open (no dressing)   Procedure completion:  Tolerated well, no immediate complications    Medications Ordered in ED Medications  Tdap (BOOSTRIX) injection 0.5 mL (0.5 mLs Intramuscular Given 12/21/22 1944)  lidocaine (XYLOCAINE) 2 % (with pres) injection 300 mg (300 mg Infiltration Given 12/22/22 0155)    ED Course/ Medical Decision Making/ A&P                             Medical Decision Making Risk Prescription drug management.   37 year old female presenting today with a laceration.   Unknown last tetanus shot was updated today.  She was neurovascularly intact prior to lactic repair.  7 Prolene sutures were placed and she was discharged home with wound care instructions.  She will follow-up to have them removed either with PCP or urgent care in a week.  All of her questions were answered and she was discharged   Final Clinical Impression(s) / ED Diagnoses Final diagnoses:  Laceration of left little finger without foreign body without damage to nail, initial encounter    Rx / DC Orders ED Discharge Orders     None      Results and diagnoses were explained to the patient. Return precautions discussed in full. Patient had no additional questions and expressed complete understanding.   This chart was dictated using voice recognition software.  Despite best efforts to proofread,  errors can occur which can change the documentation meaning.     Rhae Hammock, PA-C 12/22/22 0300    Quintella Reichert, MD 12/22/22 [phone]

## 2022-12-23 ENCOUNTER — Telehealth: Payer: Self-pay

## 2022-12-23 NOTE — Transitions of Care (Post Inpatient/ED Visit) (Unsigned)
   12/23/2022  Name: Laura Guerra MRN: [id number] DOB: [birth year]  Today's TOC FU Call Status: Today's TOC FU Call Status:: Unsuccessul Call (1st Attempt) Unsuccessful Call (1st Attempt) Date: 12/23/22  Attempted to reach the patient regarding the most recent Inpatient/ED visit.  Follow Up Plan: Additional outreach attempts will be made to reach the patient to complete the Transitions of Care (Post Inpatient/ED visit) call.   Signature Juanda Crumble, [place] Direct Dial [phone]

## 2022-12-24 NOTE — Transitions of Care (Post Inpatient/ED Visit) (Signed)
   12/24/2022  Name: Laura Guerra MRN: [id number] DOB: 09/03/1986  Today's TOC FU Call Status: Today's TOC FU Call Status:: Successful TOC FU Call Competed Unsuccessful Call (1st Attempt) Date: 12/23/22 [facility] FU Call Complete Date: 12/24/22  Transition Care Management Follow-up Telephone Call Date of Discharge: 12/22/22 Discharge Facility: [facility]) Type of Discharge: Emergency Department Reason for ED Visit: Other: (laceration) How have you been since you were released from the hospital?: Better Any questions or concerns?: No  Items Reviewed: Did you receive and understand the discharge instructions provided?: Yes Medications obtained and verified?: Yes (Medications Reviewed) Any new allergies since your discharge?: No Dietary orders reviewed?: NA Do you have support at home?: Yes People in Home: significant other  Home Care and Equipment/Supplies: [place] Ordered?: NA Any new equipment or medical supplies ordered?: NA  Functional Questionnaire: Do you need assistance with bathing/showering or dressing?: No Do you need assistance with meal preparation?: No Do you need assistance with eating?: No Do you have difficulty maintaining continence: No Do you need assistance with getting out of bed/getting out of a chair/moving?: No Do you have difficulty managing or taking your medications?: No  Follow up appointments reviewed: PCP Follow-up appointment confirmed?: [facility] Follow-up appointment confirmed?: NA Do you need transportation to your follow-up appointment?: No Do you understand care options if your condition(s) worsen?: Yes-patient verbalized understanding    [place], [place] Direct Dial [phone]

## 2023-03-18 ENCOUNTER — Ambulatory Visit: Payer: BC Managed Care – PPO | Admitting: Family Medicine

## 2023-03-18 ENCOUNTER — Encounter: Payer: Self-pay | Admitting: Family Medicine

## 2023-03-18 VITALS — Ht 65.0 in | Wt 237.0 lb

## 2023-03-18 DIAGNOSIS — H5789 Other specified disorders of eye and adnexa: Secondary | ICD-10-CM | POA: Diagnosis not present

## 2023-03-18 DIAGNOSIS — L290 Pruritus ani: Secondary | ICD-10-CM

## 2023-03-18 MED ORDER — ALBENDAZOLE 200 MG PO TABS: ORAL_TABLET | ORAL | 0 refills | Status: DC

## 2023-03-18 NOTE — Progress Notes (Signed)
Subjective:     Patient ID: Laura Guerra, female    DOB: 1986-09-16, 37 y.o.   MRN: [id number]  Chief Complaint  Patient presents with   pinworms    States she was checking and did a swipe with her finger in her rectum got a worm on her finger, brought it with her. Noticed 2 days ago  Itching and swelling up her hemorrhoids    HPI  Discussed the use of AI scribe software for clinical note transcription with the patient, who gave verbal consent to proceed.  History of Present Illness          C/o rectal itching itching reports seeing small white worms in her stool. States she has pinworms. She has a plastic ziploc with her that she states contains a worm.   Denies fever, chills, abdominal pain, N/V/D.   Reports having irritation of her eyes.   Also reports shortness of breath but not worse than usual. States she is overweight and smokes.     Health Maintenance Due  Topic Date Due   COVID-19 Vaccine (1) Never done   Hepatitis C Screening  Never done   PAP SMEAR-Modifier  01/31/2023    Past Medical History:  Diagnosis Date   No pertinent past medical history    Seasonal depression (HCC)     Past Surgical History:  Procedure Laterality Date   NO PAST SURGERIES     WISDOM TOOTH EXTRACTION      Family History  Problem Relation Age of Onset   Cancer Father        unknown type.  deceased age 18.   Cancer Maternal Grandmother        breast   Breast cancer Maternal Grandmother    Cancer Paternal Uncle        unknown type.    Social History   Socioeconomic History   Marital status: Single    Spouse name: Not on file   Number of children: 2   Years of education: 12   Highest education level: Not on file  Occupational History   Occupation: Academic librarian  Tobacco Use   Smoking status: Every Day    Packs/day: 1.00    Years: 20.00    Additional pack years: 0.00    Total pack years: 20.00    Types: Cigarettes   Smokeless tobacco: Never  Vaping  Use   Vaping Use: Never used  Substance and Sexual Activity   Alcohol use: No    Comment: occasionally   Drug use: Not Currently    Types: Marijuana   Sexual activity: Yes    Partners: Male    Birth control/protection: I.U.D.  Other Topics Concern   Not on file  Social History Narrative   Not on file   Social Determinants of Health   Financial Resource Strain: Not on file  Food Insecurity: Not on file  Transportation Needs: Not on file  Physical Activity: Not on file  Stress: Not on file  Social Connections: Not on file  Intimate Partner Violence: Not on file    Outpatient Medications Prior to Visit  Medication Sig Dispense Refill   terbinafine (LAMISIL) 250 MG tablet Take 1 pill daily for 1 week then skip 3 weeks for 3 months (Patient not taking: Reported on 03/18/2023) 21 tablet 0   amoxicillin-clavulanate (AUGMENTIN) 875-125 MG tablet Take 1 tablet by mouth 2 (two) times daily. Don't take if pregnant. (Patient not taking: Reported on 03/18/2023) 20 tablet 0  fluconazole (DIFLUCAN) 150 MG tablet Take 1 tablet (150 mg total) by mouth every 3 (three) days as needed. (Patient not taking: Reported on 03/18/2023) 2 tablet 0   ondansetron (ZOFRAN-ODT) 8 MG disintegrating tablet Take 1 tablet (8 mg total) by mouth every 8 (eight) hours as needed for nausea or vomiting. (Patient not taking: Reported on 03/18/2023) 20 tablet 0   No facility-administered medications prior to visit.    Allergies  Allergen Reactions   Bee Venom    Pennsaid [Diclofenac Sodium]     Local rash    ROS     Objective:    Physical Exam Constitutional:      General: She is not in acute distress.    Appearance: She is not ill-appearing.  Eyes:     Extraocular Movements: Extraocular movements intact.  Cardiovascular:     Rate and Rhythm: Normal rate.  Pulmonary:     Effort: Pulmonary effort is normal.  Musculoskeletal:     Cervical back: Normal range of motion.  Skin:    General: Skin is dry.   Neurological:     General: No focal deficit present.     Mental Status: She is alert and oriented to person, place, and time.  Psychiatric:        Mood and Affect: Mood normal.        Behavior: Behavior normal.      Ht 5\' 5"  (1.651 m)   Wt 237 lb (107.5 kg)   BMI 39.44 kg/m  Wt Readings from Last 3 Encounters:  03/18/23 237 lb (107.5 kg)  12/21/22 240 lb (108.9 kg)  07/31/22 240 lb (108.9 kg)       Assessment & Plan:   Problem List Items Addressed This Visit   None Visit Diagnoses     Rectal itching    -  Primary   Relevant Medications   albendazole (ALBENZA) 200 MG tablet   Eye irritation          Albendazole prescribed. Treat empirically. She is aware of the contagious nature of condition.  Recommend washing bedding and towels and frequent hand washing.    I have discontinued Majel Homer. Hanauer "Shay"'s fluconazole, amoxicillin-clavulanate, and ondansetron. I am also having her start on albendazole. Additionally, I am having her maintain her terbinafine.  Meds ordered this encounter  Medications   albendazole (ALBENZA) 200 MG tablet    Sig: Take two tablets by mouth on empty stomach today. Repeat dose in 2 weeks.    Dispense:  4 tablet    Refill:  0    Order Specific Question:   Supervising Provider    Answer:   Hillard Danker A [[id number]]

## 2023-03-18 NOTE — Patient Instructions (Signed)
Pinworms Pinworms are a type of parasite that causes a common infection of the intestines. They are small, white worms that spread very easily from person to person (are contagious). What are the causes? This condition is caused by swallowing the eggs of a pinworm. The eggs can be found in infected (contaminated) food or beverages or on hands, toys, or clothing. After the eggs have been swallowed, they hatch in the intestines. When they grow and mature, the female worms travel out of the opening between the buttocks (anus) and lay eggs in the anal area at night. These eggs then contaminate everything they come into contact with, including skin, clothes, towels, and bedding. This continues the cycle of infection. What increases the risk? This condition is likely to develop in children who come in contact with many other people, such as at a daycare or school, and in people who live in long-term care facilities. It can then be passed to family members or other caregivers. What are the signs or symptoms? Symptoms of this condition include: Itching at the anus, or the area around the anus, especially at night. Trouble sleeping and restlessness. Nausea or pain in the abdomen. Bedwetting. Trouble urinating. Vaginal discharge or itching. In some cases, there are no symptoms. In rare cases, allergic reactions or worms traveling to other parts of the body may cause problems, including pain, additional infection, or inflammation. How is this diagnosed? This condition is diagnosed based on your medical history and a physical exam. Your health care provider may ask you to apply a piece of adhesive tape to your anal area in the morning before using the bathroom. The eggs will stick to the tape. Your health care provider will then look at the tape under a microscope to confirm the diagnosis. How is this treated? This condition may be treated with: Anti-parasitic oral medicine to get rid of the  pinworms. Topical medicines to help with itching, such as petroleum gel. Your health care provider may recommend that everyone in your household and any caregivers also be treated for pinworms. Follow these instructions at home: Medicines Take or apply over-the-counter and prescription medicines only as told by your health care provider. If you were prescribed an anti-parasitic medicine, take it as told by your health care provider. Do not stop taking the anti-parasitic medicine even if you start to feel better. General instructions  Wash your hands often with soap and water for at least 20 seconds. If soap and water are not available, use hand sanitizer. Keep your nails short, and do not bite your nails. Change clothing and underwear daily. Wash bedding, pajamas, underwear, and towels in hot water after each use until pinworms are gone. Do not scratch the skin around the anus. Take a shower instead of a bath until the infection is gone. Keep all follow-up visits. This is important. How is this prevented? Make sure that all members of your household wash their hands often to prevent the spread of infection. Keep nails trimmed, avoid biting them, and do not scratch around the anus. Wash clothing and bedding every morning in hot water. Vacuum rugs and floors to try to eliminate eggs. Avoid oral-anal contact during sex. Shower every morning to help remove eggs. Maintain sanitary habits as recurrence is common. Where to find more information Centers for Disease Control and Prevention: [employer] Contact a health care provider if: You have new symptoms. You do not get better with treatment. Your symptoms get worse. Summary Pinworm infection can occur in  children who come in contact with many other people, such as at a daycare or school, and in people who live in long-term care facilities. After pinworm eggs are swallowed, they grow in the intestine. The worms travel out of the anus and  lay eggs in that area at night. The most common symptoms of infection are itching around the anus, difficulty sleeping, and restlessness. The best way to control the spread of infection is by washing hands often, keeping nails trimmed, changing clothing and underwear daily, and washing bedding and towels in hot water after each use. This information is not intended to replace advice given to you by your health care provider. Make sure you discuss any questions you have with your health care provider. Document Revised: 08/05/2020 Document Reviewed: 08/05/2020 Elsevier Patient Education  2024 [employer].

## 2024-01-06 ENCOUNTER — Telehealth: Admitting: Physician Assistant

## 2024-01-06 DIAGNOSIS — R3989 Other symptoms and signs involving the genitourinary system: Secondary | ICD-10-CM | POA: Diagnosis not present

## 2024-01-06 MED ORDER — CEPHALEXIN 500 MG PO CAPS: 500.0000 mg | ORAL_CAPSULE | Freq: Two times a day (BID) | ORAL | 0 refills | Status: AC

## 2024-01-06 NOTE — Progress Notes (Signed)
 I have spent 5 minutes in review of e-visit questionnaire, review and updating patient chart, medical decision making and response to patient.   Piedad Climes, PA-C

## 2024-01-06 NOTE — Progress Notes (Signed)

## 2024-01-25 ENCOUNTER — Ambulatory Visit

## 2024-02-02 ENCOUNTER — Ambulatory Visit
Admission: RE | Admit: 2024-02-02 | Discharge: 2024-02-02 | Disposition: A | Discharge status: Home / Self Care | Source: Ambulatory Visit | Attending: Family Medicine | Admitting: Family Medicine

## 2024-02-02 VITALS — BP 118/78 | HR 81 | Temp 98.0°F | Resp 14

## 2024-02-02 DIAGNOSIS — R350 Frequency of micturition: Secondary | ICD-10-CM

## 2024-02-02 LAB — POCT URINALYSIS DIP (MANUAL ENTRY)
Bilirubin, UA: NEGATIVE
Glucose, UA: NEGATIVE mg/dL
Ketones, POC UA: NEGATIVE mg/dL
Leukocytes, UA: NEGATIVE
Nitrite, UA: NEGATIVE
Protein Ur, POC: NEGATIVE mg/dL
Spec Grav, UA: 1.015
Urobilinogen, UA: 0.2 U/dL
pH, UA: 7

## 2024-02-02 NOTE — ED Triage Notes (Signed)
 Pt reports oliguria with possible urinary retention x1 week. Pt states she drinks at least a gallon of water daily and does not feel like output is matching up. Also notes L flank pain. Pt reports UTI back on 4/10 and she completed treatment but wonders if some of it lingered due to alcohol use and hot tub use. No hematuria or dysuria. Notes urinary urgency today.

## 2024-02-02 NOTE — ED Provider Notes (Signed)
 [facility]    CSN: [id number] Arrival date & time: 02/02/24  1546      History   Chief Complaint Chief Complaint  Patient presents with   Urinary Retention    Possible UTI need to check my urine - Entered by patient    HPI Laura Guerra is a 38 y.o. female.   HPI Patient here today to check and see if her UTI has resolved.  Patient was diagnosed with a UTI via virtual visit about a month ago and completed a course of Keflex .  She reports she was on gross amount of water concern as she is only urinating every 6 to 8 hours and she is concerned for urinary retention.  Denies any hematuria or dysuria.  She also denies any nausea or vomiting.   Patient Active Problem List   Diagnosis Date Noted   Urinary frequency 01/27/2022   Toenail fungus 01/27/2022   Angio-edema 01/25/2020   E. 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  deceased age 90.   Cancer Maternal Grandmother        breast   Breast cancer Maternal Grandmother    Cancer Paternal Uncle        unknown type.    Social History Social History   Tobacco Use   Smoking status: Every Day    Current packs/day: 1.00    Average packs/day: 1 pack/day for 20.0 years (20.0 ttl pk-yrs)    Types:  Cigarettes   Smokeless tobacco: Never  Vaping Use   Vaping status: Never Used  Substance Use Topics   Alcohol use: Yes    Comment: occasionally   Drug use: Not Currently    Types: Marijuana     Allergies   Bee venom and Pennsaid  [diclofenac  sodium]   Review of Systems Review of Systems Pertinent negatives listed in HPI  Physical Exam Triage Vital Signs ED Triage Vitals  Encounter Vitals Group     BP      Systolic BP Percentile      Diastolic BP Percentile      Pulse      Resp      Temp      Temp src      SpO2      Weight      Height      Head Circumference      Peak Flow      Pain Score  Pain Loc      Pain Education      Exclude from Growth Chart    No data found.  Updated Vital Signs BP 118/78 (BP Location: Right Arm)   Pulse 81   Temp 98 F (36.7 C) (Oral)   Resp 14   LMP 01/21/2024 (Exact Date)   SpO2 95%   Visual Acuity Right Eye Distance:   Left Eye Distance:   Bilateral Distance:    Right Eye Near:   Left Eye Near:    Bilateral Near:     Physical Exam:  General appearance: alert, well developed, well nourished, cooperative and in no distress Head: Normocephalic, without obvious abnormality, atraumatic Respiratory: Respirations even and unlabored, normal respiratory rate Heart: Rate and rhythm normal.   CVA:  no flank pain Extremities: No gross deformities Skin: Skin color, texture, turgor normal. No rashes seen  Psych: Appropriate mood and affect.    UC Treatments / Results  Labs (all labs ordered are listed, but only abnormal results are displayed) Labs Reviewed  POCT URINALYSIS DIP (MANUAL ENTRY) - Abnormal; Notable for the following components:      Result Value   Blood, UA trace-intact (*)    All other components within normal limits    EKG   Radiology No results found.  Procedures Procedures (including critical care time)  Medications Ordered in UC Medications - No data to display  Initial Impression /  Assessment and Plan / UC Course  I have reviewed the triage vital signs and the nursing notes.  Pertinent labs & imaging results that were available during my care of the patient were reviewed by me and considered in my medical decision making (see chart for details).    Patient with suspected UTI although does not have any UTI symptoms, UA completely unremarkable.  Length of time patient is: Without urinating at this present present time does not appear to be grossly abnormal advised patient to follow-up with primary care provider if she continues to have concerns. Final Clinical Impressions(s) / UC Diagnoses   Final diagnoses:  Frequency of urination   Discharge Instructions   None    ED Prescriptions   None    PDMP not reviewed this encounter.   Buena Carmine, NP 02/02/24 2330

## 2024-02-14 ENCOUNTER — Ambulatory Visit (INDEPENDENT_AMBULATORY_CARE_PROVIDER_SITE_OTHER): Admitting: Podiatry

## 2024-02-14 ENCOUNTER — Encounter: Payer: Self-pay | Admitting: Podiatry

## 2024-02-14 DIAGNOSIS — L608 Other nail disorders: Secondary | ICD-10-CM

## 2024-02-14 NOTE — Progress Notes (Signed)
   Chief Complaint  Patient presents with   Nail Problem    Thick nail. Had for 6 months. 0 pain. Non diabetic.    HPI: 38 y.o. female presenting today for evaluation of nail dystrophy to the right second digit.  Patient states that she dropped an object on her right second toe about 1 year ago.  Since that time it has been thick and grows abnormal..  No pain or tenderness.  Past Medical History:  Diagnosis Date   No pertinent past medical history    Seasonal depression (HCC)     Past Surgical History:  Procedure Laterality Date   NO PAST SURGERIES     WISDOM TOOTH EXTRACTION      Allergies  Allergen Reactions   Bee Venom    Pennsaid  [Diclofenac  Sodium]     Local rash     Physical Exam: General: The patient is alert and oriented x3 in no acute distress.  Dermatology: Skin is warm, dry and supple bilateral lower extremities.  Hyperkeratotic pincer nail deformity noted to the right second digit  Vascular: Palpable pedal pulses bilaterally. Capillary refill within normal limits.  No appreciable edema.  No erythema.  Neurological: Grossly intact via light touch  Musculoskeletal Exam: No pedal deformities noted   Assessment/Plan of Care: 1.  Nail dystrophy right second digit secondary to history of trauma about 1 year ago  -Patient evaluated -Fortunately the toe is not symptomatic.  Explained to the patient that I believe the majority of the nail dystrophy is likely secondary to the history of trauma.  It does not appear to be fungal related.  No intervention is recommended -Recommend good foot hygiene and maintaining the nail -Return to clinic PRN       Dot Gazella, DPM [facility]  Dr. Dot Gazella, DPM    [address] [place], [place] [address]                Office [phone]  Fax [phone]

## 2024-05-26 ENCOUNTER — Telehealth: Admitting: Physician Assistant

## 2024-05-26 DIAGNOSIS — B349 Viral infection, unspecified: Secondary | ICD-10-CM | POA: Diagnosis not present

## 2024-05-26 MED ORDER — PROMETHAZINE-DM 6.25-15 MG/5ML PO SYRP: 5.0000 mL | ORAL_SOLUTION | Freq: Four times a day (QID) | ORAL | 0 refills | Status: DC | PRN

## 2024-05-26 MED ORDER — NAPROXEN 500 MG PO TABS: 500.0000 mg | ORAL_TABLET | Freq: Two times a day (BID) | ORAL | 0 refills | Status: DC

## 2024-05-26 MED ORDER — ALBUTEROL SULFATE HFA 108 (90 BASE) MCG/ACT IN AERS: 1.0000 | INHALATION_SPRAY | Freq: Four times a day (QID) | RESPIRATORY_TRACT | 0 refills | Status: DC | PRN

## 2024-05-26 MED ORDER — FLUTICASONE PROPIONATE 50 MCG/ACT NA SUSP: 2.0000 | Freq: Every day | NASAL | 0 refills | Status: DC

## 2024-05-26 NOTE — Progress Notes (Signed)
 E-Visit for Covid/Influenza Screening  Your current symptoms could be consistent with Covid or Influenza.  Please complete a Covid + Flu test either at home or check with your local pharmacy to see if they provide testing.    If you have tested positive for Covid or Flu, this means that you were infected with that virus, and could give the virus to others.  Most people with Covid or Flu have mild illness and can recover at home without medical care. Do not leave your home, except to get medical care. Do not visit public areas and do not go to places where you are unable to wear a mask. It is important that you stay home  to take care for yourself and to help protect other people in your home and community.      Isolation Instructions:   You are to isolate at home until you have been fever free for at least 24 hours without a fever-reducing medication, and symptoms have been steadily improving for 24 hours. At that time,  you can end isolation but need to mask for an additional 5 days.  If you must be around other household members who do not have symptoms, you need to make sure that both you and the family members are masking consistently with a high-quality mask.  If you note any worsening of symptoms despite treatment, please seek an in-person evaluation ASAP. If you note any significant shortness of breath or any chest pain, please seek ER evaluation. Please do not delay care!  Go to the nearest hospital ED for assessment if fever/cough/breathlessness are severe or illness seems like a threat to life.    The following symptoms may appear 2-14 days after exposure: Fever Cough Shortness of breath or difficulty breathing Chills Repeated shaking with chills Muscle pain Headache Sore throat New loss of taste or smell Fatigue Congestion or runny nose Nausea or vomiting Diarrhea  You can use medication such as I have prescribed an inhaler - Albuterol  MDI 90 mcg /actuation 2 puffs every 4  hours as needed for shortness of breath, wheezing, cough, I have prescribed Phenergan  DM 6.25 mg/15 mg. You make take one teaspoon / 5 ml every 4-6 hours as needed for cough, I have prescribed an anti-inflammatory - Naprosyn  500 mg. Take twice daily as needed for fever or body aches for 2 weeks, and I have prescribed Fluticasone  nasal spray 2 sprays in each nostril one time per dayasal spray 2 sprays in each nostril one time per day  You may also take acetaminophen  (Tylenol ) as needed for fever.  HOME CARE Only take medications as instructed by your medical team. Drink plenty of fluids and get plenty of rest. A steam or ultrasonic humidifier can help if you have congestion.  GET HELP RIGHT AWAY IF YOU HAVE EMERGENCY WARNING SIGNS.  Call 911 or proceed to your closest emergency facility if: You develop worsening high fever. Trouble breathing Bluish lips or face Persistent pain or pressure in the chest New confusion Inability to wake or stay awake You cough up blood. Your symptoms become more severe Inability to hold down food or fluids  This list is not all possible symptoms. Contact your medical provider for any symptoms that are severe or concerning to you.   Your e-visit answers were reviewed by a board certified advanced clinical practitioner to complete your personal care plan.  Depending on the condition, your plan could have included both over the counter or prescription medications.  If  there is a problem, please reply once you have received a response from your provider.  Your safety is important to us .  If you have drug allergies check your prescription carefully.    You can use MyChart to ask questions about today's visit, request a non-urgent call back, or ask for a work or school excuse for 24 hours related to this e-Visit. If it has been greater than 24 hours you will need to follow up with your provider or enter a new e-Visit to address those concerns. You will get an e-mail  in the next two days asking about your experience.  I hope that your e-visit has been valuable and will speed your recovery. Thank you for using e-visits.      I have spent 5 minutes in review of e-visit questionnaire, review and updating patient chart, medical decision making and response to patient.   Laura Guerra Dickinson, PA-C

## 2024-08-14 ENCOUNTER — Telehealth: Admitting: Physician Assistant

## 2024-08-14 DIAGNOSIS — J069 Acute upper respiratory infection, unspecified: Secondary | ICD-10-CM

## 2024-08-14 MED ORDER — ALBUTEROL SULFATE HFA 108 (90 BASE) MCG/ACT IN AERS: 1.0000 | INHALATION_SPRAY | Freq: Four times a day (QID) | RESPIRATORY_TRACT | 0 refills | Status: DC | PRN

## 2024-08-14 MED ORDER — PSEUDOEPH-BROMPHEN-DM 30-2-10 MG/5ML PO SYRP: 5.0000 mL | ORAL_SOLUTION | Freq: Four times a day (QID) | ORAL | 0 refills | Status: DC | PRN

## 2024-08-14 MED ORDER — FLUTICASONE PROPIONATE 50 MCG/ACT NA SUSP: 2.0000 | Freq: Every day | NASAL | 0 refills | Status: AC

## 2024-08-14 MED ORDER — PREDNISONE 20 MG PO TABS: 40.0000 mg | ORAL_TABLET | Freq: Every day | ORAL | 0 refills | Status: DC

## 2024-08-14 NOTE — Progress Notes (Signed)

## 2024-08-27 ENCOUNTER — Telehealth: Admitting: Family

## 2024-08-27 DIAGNOSIS — J208 Acute bronchitis due to other specified organisms: Secondary | ICD-10-CM

## 2024-08-27 DIAGNOSIS — B9689 Other specified bacterial agents as the cause of diseases classified elsewhere: Secondary | ICD-10-CM | POA: Diagnosis not present

## 2024-08-27 MED ORDER — DOXYCYCLINE HYCLATE 100 MG PO TABS: 100.0000 mg | ORAL_TABLET | Freq: Two times a day (BID) | ORAL | 0 refills | Status: DC

## 2024-08-27 MED ORDER — BENZONATATE 200 MG PO CAPS: 200.0000 mg | ORAL_CAPSULE | Freq: Two times a day (BID) | ORAL | 0 refills | Status: DC | PRN

## 2024-08-27 MED ORDER — VARENICLINE TARTRATE (STARTER) 0.5 MG X 11 & 1 MG X 42 PO TBPK: ORAL_TABLET | ORAL | 0 refills | Status: DC

## 2024-08-27 NOTE — Addendum Note (Signed)
 Addended by: LAVELL LYE A on: 08/27/2024 02:47 PM   Modules accepted: Orders

## 2024-08-27 NOTE — Progress Notes (Signed)
 E-Visit for Smoking Cessation  Congratulations for your interest in quitting smoking!  Quitting smoking is one of the most important things you can do to protect your health.  We are here to help you! Did you know that if you quit smoking 1 pack per day you could save up to $2550.00 per year?  Medications are not appropriate for everyone depending upon your situation and any health issues you may have. If we do prescribe medication, it will be for 1 month at time and you will be required to have a follow up E-Visit at 1 month to assess how you are doing and any side effects.  This could be up to a total of 3 months.    Support of your friends, family and work colleagues is very important. Please let them know that you are trying to stop smoking so that they understand your need for support in this goal.  Remove tobacco products from your environment  Set a quit date ideally within 2 weeks.  You should totally abstain from smoking after your quit date. A single puff could hurt your progress or cause you to relapse  If there are others in your household that smoke, ask them to try to quit or abstain from smoking in your presence  You may notice nicotine withdrawal symptoms such as increased appetite and weight gain, changes in mood, insomnia, irritability and/or anxiety. These symptoms peak in the first three days after smoking cessation and subside over the next 3-4 weeks.   I have prescribed Chantix  (Varenicline ) .5 mg tablets. Start 1 week before your quit date. Take after eating with a full glass of water. take one tablet by mouth daily for 3 days. On day 4, begin taking one tablet twice a day for 4 days. Then start taking 2 tablets twice daily for the rest of the 12 week course.  You may use over the counter Nicotine gum. Chew one piece of gum every one to two hours while awake and when there is an urge to smoke. Use up to 24 pieces of gum per day for six weeks. Then gradually reduce use over a  second six weeks. Then gradually reduce use over a second six weeks. Chew the gum slowly - using the "chew" and "park" method. Chew the gum until the nicotine taste appears, then "park" the gum until the taste disappears, then chew again to release more nicotine. Chew the gum for 30 minutes.  A combination of behavioral and medication can improve the success of you quitting smoking. You may want to use the 1-800-QUIT-NOW free support line.  Also, the Department of Health and Human Services provides Smoke free apps for smartphones: sharedcustomer.fi  If you happen to break your plan and have a cigarette, keep taking your medications and continue to try to abstain.  Do not give up!  MAKE SURE YOU  Take any prescribed medications only as instructed.  If you miss a dose of medication, take the next dose when it is due and get back on schedule. DO NOT double up on medications.  Mark your calendar to do your Follow Up Smoking Cessation E-Visit in one month    Thank you for choosing an e-visit.  Your e-visit answers were reviewed by a board certified advanced clinical practitioner to complete your personal care plan. Depending upon the condition, your plan could have included both over the counter or prescription medications.  Please review your pharmacy choice. Make sure the pharmacy is open  so you can pick up prescription now. If there is a problem, you may contact your provider through [employer] and have the prescription routed to another pharmacy.  Your safety is important to us . If you have drug allergies check your prescription carefully.   For the next 24 hours you can use MyChart to ask questions about today's visit, request a non-urgent call back, or ask for a work or school excuse. You will get an email in the next two days asking about your experience. I hope that your e-visit has been valuable and will speed your recovery.

## 2024-08-27 NOTE — Progress Notes (Signed)
 We are sorry that you are not feeling well.  Here is how we plan to help!  Based on your presentation I believe you most likely have A cough due to bacteria.  When patients have a fever and a productive cough with a change in color or increased sputum production, we are concerned about bacterial bronchitis.  If left untreated it can progress to pneumonia.  If your symptoms do not improve with your treatment plan it is important that you contact your provider.   I have prescribed Doxycycline  100 mg twice a day for 7 days     In addition you may use A non-prescription cough medication called Robitussin DAC. Take 2 teaspoons every 8 hours or Delsym: take 2 teaspoons every 12 hours., A non-prescription cough medication called Mucinex DM: take 2 tablets every 12 hours., and A prescription cough medication called Tessalon  Perles 100mg . You may take 1-2 capsules every 8 hours as needed for your cough.    From your responses in the eVisit questionnaire you describe inflammation in the upper respiratory tract which is causing a significant cough.  This is commonly called Bronchitis and has four common causes:   Allergies Viral Infections Acid Reflux Bacterial Infection Allergies, viruses and acid reflux are treated by controlling symptoms or eliminating the cause. An example might be a cough caused by taking certain blood pressure medications. You stop the cough by changing the medication. Another example might be a cough caused by acid reflux. Controlling the reflux helps control the cough.  USE OF BRONCHODILATOR (RESCUE) INHALERS: There is a risk from using your bronchodilator too frequently.  The risk is that over-reliance on a medication which only relaxes the muscles surrounding the breathing tubes can reduce the effectiveness of medications prescribed to reduce swelling and congestion of the tubes themselves.  Although you feel brief relief from the bronchodilator inhaler, your asthma may actually be  worsening with the tubes becoming more swollen and filled with mucus.  This can delay other crucial treatments, such as oral steroid medications. If you need to use a bronchodilator inhaler daily, several times per day, you should discuss this with your provider.  There are probably better treatments that could be used to keep your asthma under control.     HOME CARE Only take medications as instructed by your medical team. Complete the entire course of an antibiotic. Drink plenty of fluids and get plenty of rest. Avoid close contacts especially the very young and the elderly Cover your mouth if you cough or cough into your sleeve. Always remember to wash your hands A steam or ultrasonic humidifier can help congestion.   GET HELP RIGHT AWAY IF: You develop worsening fever. You become short of breath You cough up blood. Your symptoms persist after you have completed your treatment plan MAKE SURE YOU  Understand these instructions. Will watch your condition. Will get help right away if you are not doing well or get worse.  Your e-visit answers were reviewed by a board certified advanced clinical practitioner to complete your personal care plan.  Depending on the condition, your plan could have included both over the counter or prescription medications. If there is a problem please reply  once you have received a response from your provider. Your safety is important to us .  If you have drug allergies check your prescription carefully.    You can use MyChart to ask questions about today's visit, request a non-urgent call back, or ask for a work or  school excuse for 24 hours related to this e-Visit. If it has been greater than 24 hours you will need to follow up with your provider, or enter a new e-Visit to address those concerns. You will get an e-mail in the next two days asking about your experience.  I hope that your e-visit has been valuable and will speed your recovery. Thank you for using  e-visits.   I have spent 5 minutes in review of e-visit questionnaire, review and updating patient chart, medical decision making and response to patient.   Bari Learn, FNP

## 2024-10-16 ENCOUNTER — Telehealth: Admitting: Physician Assistant

## 2024-10-16 DIAGNOSIS — F17210 Nicotine dependence, cigarettes, uncomplicated: Secondary | ICD-10-CM

## 2024-10-16 DIAGNOSIS — B9689 Other specified bacterial agents as the cause of diseases classified elsewhere: Secondary | ICD-10-CM

## 2024-10-16 DIAGNOSIS — R051 Acute cough: Secondary | ICD-10-CM

## 2024-10-16 MED ORDER — VARENICLINE TARTRATE 1 MG PO TABS: 1.0000 mg | ORAL_TABLET | Freq: Two times a day (BID) | ORAL | 1 refills | Status: AC

## 2024-10-16 MED ORDER — BENZONATATE 200 MG PO CAPS: 200.0000 mg | ORAL_CAPSULE | Freq: Two times a day (BID) | ORAL | 0 refills | Status: AC | PRN

## 2024-10-16 NOTE — Progress Notes (Signed)
 We are sorry that you are not feeling well.  Here is how we plan to help!  Based on your presentation I believe you most likely have A cough due to a virus.  This is called viral bronchitis and is best treated by rest, plenty of fluids and control of the cough.  You may use Ibuprofen  or Tylenol  as directed to help your symptoms.     In addition you may use A prescription cough medication called Tessalon Perles 100mg . You may take 1-2 capsules every 8 hours as needed for your cough.    From your responses in the eVisit questionnaire you describe inflammation in the upper respiratory tract which is causing a significant cough.  This is commonly called Bronchitis and has four common causes:   Allergies Viral Infections Acid Reflux Bacterial Infection Allergies, viruses and acid reflux are treated by controlling symptoms or eliminating the cause. An example might be a cough caused by taking certain blood pressure medications. You stop the cough by changing the medication. Another example might be a cough caused by acid reflux. Controlling the reflux helps control the cough.  USE OF BRONCHODILATOR (RESCUE) INHALERS: There is a risk from using your bronchodilator too frequently.  The risk is that over-reliance on a medication which only relaxes the muscles surrounding the breathing tubes can reduce the effectiveness of medications prescribed to reduce swelling and congestion of the tubes themselves.  Although you feel brief relief from the bronchodilator inhaler, your asthma may actually be worsening with the tubes becoming more swollen and filled with mucus.  This can delay other crucial treatments, such as oral steroid medications. If you need to use a bronchodilator inhaler daily, several times per day, you should discuss this with your provider.  There are probably better treatments that could be used to keep your asthma under control.     HOME CARE Only take medications as instructed by your  medical team. Complete the entire course of an antibiotic. Drink plenty of fluids and get plenty of rest. Avoid close contacts especially the very young and the elderly Cover your mouth if you cough or cough into your sleeve. Always remember to wash your hands A steam or ultrasonic humidifier can help congestion.   GET HELP RIGHT AWAY IF: You develop worsening fever. You become short of breath You cough up blood. Your symptoms persist after you have completed your treatment plan MAKE SURE YOU  Understand these instructions. Will watch your condition. Will get help right away if you are not doing well or get worse.  Your e-visit answers were reviewed by a board certified advanced clinical practitioner to complete your personal care plan.  Depending on the condition, your plan could have included both over the counter or prescription medications. If there is a problem please reply  once you have received a response from your provider. Your safety is important to us .  If you have drug allergies check your prescription carefully.    You can use MyChart to ask questions about today's visit, request a non-urgent call back, or ask for a work or school excuse for 24 hours related to this e-Visit. If it has been greater than 24 hours you will need to follow up with your provider, or enter a new e-Visit to address those concerns. You will get an e-mail in the next two days asking about your experience.  I hope that your e-visit has been valuable and will speed your recovery. Thank you for using e-visits.  I have spent 5 minutes in review of e-visit questionnaire, review and updating patient chart, medical decision making and response to patient.   Teena Shuck, PA-C

## 2024-10-16 NOTE — Patient Instructions (Signed)
 " Clotilda SAILOR Wolfley, thank you for joining Elsie Velma Lunger, PA-C for today's virtual visit.  While this provider is not your primary care provider (PCP), if your PCP is located in our provider database this encounter information will be shared with them immediately following your visit.  Consent: (Patient) Clotilda SAILOR Zylstra provided verbal consent for this virtual visit at the beginning of the encounter.  Current Medications:  Current Outpatient Medications:    varenicline  (CHANTIX ) 1 MG tablet, Take 1 tablet (1 mg total) by mouth 2 (two) times daily., Disp: 60 tablet, Rfl: 1   benzonatate  (TESSALON ) 200 MG capsule, Take 1 capsule (200 mg total) by mouth 2 (two) times daily as needed for cough., Disp: 20 capsule, Rfl: 0   fluticasone  (FLONASE ) 50 MCG/ACT nasal spray, Place 2 sprays into both nostrils daily., Disp: 16 g, Rfl: 0   When you are getting low on your smoking cessation medications, schedule your next video appointment as previously discussed. This way we can follow-up with you, make any necessary adjustments/changes, and get next fill of medication sent in to your pharmacy.   Follow-Up: Call back or seek an in-person evaluation if the symptoms worsen or if the condition fails to improve as anticipated.  Other Instructions  Congratulations for your interest in quitting smoking!  Quitting smoking is one of the most important things you can do to protect your health.  We are here to help you! Did you know that if you quit smoking 1 pack per day you could save up to $2550.00 per year?  Medications are not appropriate for everyone depending upon your situation and any health issues you may have. If we do prescribe medication, it will be for 1 month at time and you will be required to have a follow up Video visit at 1 month to assess how you are doing and if there are any side effects.  This could be for up to a total of 3 months.    Support from your friends, family and work  colleagues is very important. Please let them know that you are trying to stop smoking so that they understand your need for support in this goal!  - Remove tobacco products from your environment - Set a quit date ideally within 2 weeks. - You should totally abstain from smoking after your quit date. A single puff could hurt your progress or cause you to relapse - If there are others in your household that smoke, ask them to try to quit or abstain from smoking in your presence  You may notice nicotine withdrawal symptoms such as increased appetite and weight gain, changes in mood, insomnia, irritability and/or anxiety. These symptoms peak in the first three days after smoking cessation and subside over the next 3-4 weeks.   I have refilled your prescription for Chantix  (Varenicline ). Continue taking as directed for the next 4 weeks. We will see you at follow-up!  A combination of behavioral and medication can improve the success of you quitting smoking. You may want to use the 1-800-QUIT-NOW free support line.  Also, the Department of Health and Human Services provides Smoke free apps for smartphones: sharedcustomer.fi  If you happen to break your plan and have a cigarette, keep taking your medications and continue to try to abstain.  Do not give up!  MAKE SURE YOU  Take any prescribed medications only as instructed.  If you miss a dose of medication, take the next dose when it is due and get  back on schedule. DO NOT double up on medications.  Mark your calendar to do your Follow Up Smoking Cessation Visit in one month   If you have been instructed to have an in-person evaluation today at a local Urgent Care facility, please use the link below. It will take you to a list of all of our available [facility], including address, phone number and hours of operation. Please do not delay care.  [facility]  If you or a family member do not have a  primary care provider, use the link below to schedule a visit and establish care. When you choose a [facility] primary care physician or advanced practice provider, you gain a long-term partner in health. Find a Primary Care Provider  Learn more about [facility]'s in-office and virtual care options: [facility] - Get Care Now       "

## 2024-10-16 NOTE — Progress Notes (Signed)
 " Virtual Visit Consent   [name], you are scheduled for a virtual visit with a [facility] provider today. Just as with appointments in the office, your consent must be obtained to participate. Your consent will be active for this visit and any virtual visit you may have with one of our providers in the next 365 days. If you have a MyChart account, a copy of this consent can be sent to you electronically.  As this is a virtual visit, video technology does not allow for your provider to perform a traditional examination. This may limit your provider's ability to fully assess your condition. If your provider identifies any concerns that need to be evaluated in person or the need to arrange testing (such as labs, EKG, etc.), we will make arrangements to do so. Although advances in technology are sophisticated, we cannot ensure that it will always work on either your end or our end. If the connection with a video visit is poor, the visit may have to be switched to a telephone visit. With either a video or telephone visit, we are not always able to ensure that we have a secure connection.  By engaging in this virtual visit, you consent to the provision of healthcare and authorize for your insurance to be billed (if applicable) for the services provided during this visit. Depending on your insurance coverage, you may receive a charge related to this service.  I need to obtain your verbal consent now. Are you willing to proceed with your visit today? Laura Guerra has provided verbal consent on 10/16/2024 for a virtual visit (video or telephone). Laura Guerra, [place]  Date: 10/16/2024 2:50 PM   Virtual Visit via Video Note   I, Laura Guerra, connected with  Laura Guerra  ([id number], 1986/03/19) on 10/16/24 at  3:00 PM EST by a video-enabled telemedicine application and verified that I am speaking with the correct person using two identifiers.  Location: Patient:  Virtual Visit Location Patient: Home Provider: Virtual Visit Location Provider: Home Office   I discussed the limitations of evaluation and management by telemedicine and the availability of in person appointments. The patient expressed understanding and agreed to proceed.    History of Present Illness: Laura Guerra is a 39 y.o. who identifies as a female who was assigned female at birth, and is being seen today for follow-up smoking cessation visit. Patient was started on Varenicline  starting pack -- was prescribed 11/30, but she was unable to start until January first. Notes she has titrated up to the 1 mg twice daily and tolerating very well so far. Has cut back from 1 pack per day to 3 or less cigarettes per day. Denies impact on sleeping schedule. Some vivid dreams with the Chantix  but not bothersome. Denies any change in mood after the first week. First week felt blah/muted but has resolved. Has tried to be more active as a stress outlet. Some increased appetite noted since decreasing nicotine intake.    HPI: HPI  Problems:  Patient Active Problem List   Diagnosis Date Noted   Urinary frequency 01/27/2022   Toenail fungus 01/27/2022   Angio-edema 01/25/2020   E. 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  Resting comfortably  at home.  Head is normocephalic, atraumatic.  No labored breathing.  Speech is clear and coherent with logical content.  Patient is alert and oriented at baseline.   Assessment and Plan: 1. Cigarette nicotine dependence without complication (Primary) -  varenicline  (CHANTIX ) 1 MG tablet; Take 1 tablet (1 mg total) by mouth 2 (two) times daily.  Dispense: 60 tablet; Refill: 1  Sate of Readiness -- Actively involved in cessation attempt. No noted withdrawal symptoms at present. Supportive measures reviewed. Continue Chantix  1 mg BID. Refill sent in.  Plan for follow-up in 1 month. Sooner if needed.   I have spent 12 minutes in counseling regarding nicotine use, cessation attempt and ongoing management.   Follow Up Instructions: I discussed the assessment and treatment plan with the patient. The patient was provided an opportunity to ask questions and all were answered. The patient agreed with the plan and demonstrated an understanding of the instructions.  A copy of instructions were sent to the patient via MyChart unless otherwise noted below.    The patient was advised to call back or seek an in-person evaluation if the symptoms worsen or if the condition fails to improve as anticipated.    Laura Velma Lunger, PA-C    [1]  Allergies Allergen Reactions   Bee Venom    Pennsaid  [Diclofenac  Sodium]     Local rash  [2]  Current Outpatient Medications:    varenicline  (CHANTIX ) 1 MG tablet, Take 1 tablet (1 mg total) by mouth 2 (two) times daily., Disp: 60 tablet, Rfl: 1   benzonatate  (TESSALON ) 200 MG capsule, Take 1 capsule (200 mg total) by mouth 2 (two) times daily as needed for cough., Disp: 20 capsule, Rfl: 0   fluticasone  (FLONASE ) 50 MCG/ACT nasal spray, Place 2 sprays into both nostrils daily., Disp: 16 g, Rfl: 0  "

## 2024-10-16 NOTE — Addendum Note (Signed)
 Addended by: VIVIENNE FORTUNATO HERO on: 10/16/2024 01:13 PM   Modules accepted: Level of Service
